# Patient Record
Sex: Female | Born: 1940
Health system: Southern US, Community
[De-identification: ages and names within clinical notes are randomized; demographics above are authoritative.]

## PROBLEM LIST (undated history)

## (undated) DIAGNOSIS — K5190 Ulcerative colitis, unspecified, without complications: Secondary | ICD-10-CM

## (undated) DIAGNOSIS — M719 Bursopathy, unspecified: Secondary | ICD-10-CM

## (undated) DIAGNOSIS — K219 Gastro-esophageal reflux disease without esophagitis: Secondary | ICD-10-CM

## (undated) DIAGNOSIS — I471 Supraventricular tachycardia, unspecified: Secondary | ICD-10-CM

## (undated) DIAGNOSIS — R0782 Intercostal pain: Secondary | ICD-10-CM

## (undated) DIAGNOSIS — I2510 Atherosclerotic heart disease of native coronary artery without angina pectoris: Secondary | ICD-10-CM

## (undated) DIAGNOSIS — R498 Other voice and resonance disorders: Secondary | ICD-10-CM

## (undated) DIAGNOSIS — M1990 Unspecified osteoarthritis, unspecified site: Secondary | ICD-10-CM

## (undated) DIAGNOSIS — G20A1 Parkinson's disease without dyskinesia, without mention of fluctuations: Secondary | ICD-10-CM

## (undated) DIAGNOSIS — I10 Essential (primary) hypertension: Secondary | ICD-10-CM

## (undated) DIAGNOSIS — G20 Parkinson's disease: Secondary | ICD-10-CM

## (undated) DIAGNOSIS — E785 Hyperlipidemia, unspecified: Secondary | ICD-10-CM

## (undated) HISTORY — PX: HEMORROIDECTOMY: SUR656

## (undated) HISTORY — PX: CHOLECYSTECTOMY: SHX55

## (undated) HISTORY — PX: DEEP BRAIN STIMULATOR PLACEMENT: SHX608

## (undated) HISTORY — PX: ABDOMINAL HYSTERECTOMY: SHX81

---

## 2005-07-19 ENCOUNTER — Ambulatory Visit: Payer: Self-pay | Admitting: Internal Medicine

## 2005-09-11 ENCOUNTER — Ambulatory Visit: Payer: Self-pay | Admitting: Internal Medicine

## 2005-10-05 ENCOUNTER — Ambulatory Visit: Payer: Self-pay | Admitting: Pain Medicine

## 2005-10-11 ENCOUNTER — Ambulatory Visit: Payer: Self-pay | Admitting: Pain Medicine

## 2005-11-28 ENCOUNTER — Ambulatory Visit: Payer: Self-pay | Admitting: Pain Medicine

## 2005-12-06 ENCOUNTER — Ambulatory Visit: Payer: Self-pay | Admitting: Pain Medicine

## 2006-02-01 ENCOUNTER — Ambulatory Visit: Payer: Self-pay | Admitting: Pain Medicine

## 2006-02-19 ENCOUNTER — Ambulatory Visit: Payer: Self-pay | Admitting: Pain Medicine

## 2006-03-20 ENCOUNTER — Ambulatory Visit: Payer: Self-pay | Admitting: Pain Medicine

## 2006-03-28 ENCOUNTER — Ambulatory Visit: Payer: Self-pay | Admitting: Pain Medicine

## 2006-05-01 ENCOUNTER — Ambulatory Visit: Payer: Self-pay | Admitting: Pain Medicine

## 2006-07-31 ENCOUNTER — Ambulatory Visit: Payer: Self-pay | Admitting: Pain Medicine

## 2006-08-06 ENCOUNTER — Ambulatory Visit: Payer: Self-pay | Admitting: Pain Medicine

## 2006-09-18 ENCOUNTER — Ambulatory Visit: Payer: Self-pay | Admitting: Pain Medicine

## 2006-09-19 ENCOUNTER — Ambulatory Visit: Payer: Self-pay | Admitting: Pain Medicine

## 2006-09-26 ENCOUNTER — Ambulatory Visit: Payer: Self-pay | Admitting: Unknown Physician Specialty

## 2006-09-26 DIAGNOSIS — K5790 Diverticulosis of intestine, part unspecified, without perforation or abscess without bleeding: Secondary | ICD-10-CM | POA: Insufficient documentation

## 2006-10-01 ENCOUNTER — Ambulatory Visit: Payer: Self-pay | Admitting: Pain Medicine

## 2006-10-04 ENCOUNTER — Ambulatory Visit: Payer: Self-pay | Admitting: Pain Medicine

## 2006-11-23 ENCOUNTER — Ambulatory Visit: Payer: Self-pay | Admitting: Pain Medicine

## 2006-12-03 ENCOUNTER — Ambulatory Visit: Payer: Self-pay | Admitting: Pain Medicine

## 2006-12-12 ENCOUNTER — Ambulatory Visit: Payer: Self-pay | Admitting: Pain Medicine

## 2006-12-26 ENCOUNTER — Ambulatory Visit: Payer: Self-pay | Admitting: Pain Medicine

## 2007-01-02 ENCOUNTER — Ambulatory Visit: Payer: Self-pay | Admitting: Pain Medicine

## 2007-01-02 ENCOUNTER — Ambulatory Visit: Payer: Self-pay | Admitting: Internal Medicine

## 2007-01-22 ENCOUNTER — Ambulatory Visit: Payer: Self-pay | Admitting: Pain Medicine

## 2007-01-30 ENCOUNTER — Ambulatory Visit: Payer: Self-pay | Admitting: Pain Medicine

## 2007-02-11 ENCOUNTER — Ambulatory Visit: Payer: Self-pay | Admitting: Unknown Physician Specialty

## 2007-02-13 ENCOUNTER — Ambulatory Visit: Payer: Self-pay | Admitting: Unknown Physician Specialty

## 2007-03-18 ENCOUNTER — Ambulatory Visit: Payer: Self-pay | Admitting: Pain Medicine

## 2007-04-09 ENCOUNTER — Ambulatory Visit: Payer: Self-pay | Admitting: Pain Medicine

## 2007-05-14 ENCOUNTER — Ambulatory Visit: Payer: Self-pay | Admitting: Pain Medicine

## 2007-05-20 ENCOUNTER — Ambulatory Visit: Payer: Self-pay | Admitting: Pain Medicine

## 2007-06-24 ENCOUNTER — Ambulatory Visit: Payer: Self-pay | Admitting: Pain Medicine

## 2007-07-29 ENCOUNTER — Ambulatory Visit: Payer: Self-pay | Admitting: Pain Medicine

## 2007-08-13 ENCOUNTER — Ambulatory Visit: Payer: Self-pay | Admitting: Pain Medicine

## 2007-08-19 ENCOUNTER — Ambulatory Visit: Payer: Self-pay | Admitting: Pain Medicine

## 2007-09-17 ENCOUNTER — Ambulatory Visit: Payer: Self-pay | Admitting: Pain Medicine

## 2007-10-29 ENCOUNTER — Ambulatory Visit: Payer: Self-pay | Admitting: Specialist

## 2007-11-11 ENCOUNTER — Ambulatory Visit: Payer: Self-pay | Admitting: Pain Medicine

## 2007-12-17 ENCOUNTER — Ambulatory Visit: Payer: Self-pay | Admitting: Pain Medicine

## 2007-12-25 ENCOUNTER — Ambulatory Visit: Payer: Self-pay | Admitting: Pain Medicine

## 2008-01-21 ENCOUNTER — Ambulatory Visit: Payer: Self-pay | Admitting: Pain Medicine

## 2008-02-20 ENCOUNTER — Ambulatory Visit: Payer: Self-pay | Admitting: Pain Medicine

## 2008-02-26 ENCOUNTER — Ambulatory Visit: Payer: Self-pay | Admitting: Pain Medicine

## 2008-03-19 ENCOUNTER — Ambulatory Visit: Payer: Self-pay | Admitting: Pain Medicine

## 2008-04-16 ENCOUNTER — Ambulatory Visit: Payer: Self-pay | Admitting: Pain Medicine

## 2008-05-07 ENCOUNTER — Ambulatory Visit: Payer: Self-pay | Admitting: Unknown Physician Specialty

## 2008-05-25 ENCOUNTER — Ambulatory Visit: Payer: Self-pay | Admitting: Pain Medicine

## 2008-06-22 ENCOUNTER — Ambulatory Visit: Payer: Self-pay | Admitting: Pain Medicine

## 2008-07-15 ENCOUNTER — Ambulatory Visit: Payer: Self-pay | Admitting: Pain Medicine

## 2008-08-18 ENCOUNTER — Ambulatory Visit: Payer: Self-pay | Admitting: Pain Medicine

## 2008-08-26 ENCOUNTER — Ambulatory Visit: Payer: Self-pay | Admitting: Pain Medicine

## 2008-10-13 ENCOUNTER — Ambulatory Visit: Payer: Self-pay | Admitting: Pain Medicine

## 2008-11-09 ENCOUNTER — Ambulatory Visit: Payer: Self-pay | Admitting: Pain Medicine

## 2008-12-10 ENCOUNTER — Ambulatory Visit: Payer: Self-pay | Admitting: Pain Medicine

## 2008-12-16 ENCOUNTER — Ambulatory Visit: Payer: Self-pay | Admitting: Pain Medicine

## 2009-02-17 ENCOUNTER — Ambulatory Visit: Payer: Self-pay | Admitting: Pain Medicine

## 2009-03-16 ENCOUNTER — Ambulatory Visit: Payer: Self-pay | Admitting: Pain Medicine

## 2009-04-15 ENCOUNTER — Ambulatory Visit: Payer: Self-pay | Admitting: Pain Medicine

## 2009-05-13 ENCOUNTER — Ambulatory Visit: Payer: Self-pay | Admitting: Pain Medicine

## 2009-06-07 ENCOUNTER — Ambulatory Visit: Payer: Self-pay | Admitting: Internal Medicine

## 2009-06-10 ENCOUNTER — Ambulatory Visit: Payer: Self-pay | Admitting: Pain Medicine

## 2009-07-15 ENCOUNTER — Ambulatory Visit: Payer: Self-pay | Admitting: Pain Medicine

## 2009-08-12 ENCOUNTER — Ambulatory Visit: Payer: Self-pay | Admitting: Pain Medicine

## 2009-09-09 ENCOUNTER — Ambulatory Visit: Payer: Self-pay | Admitting: Pain Medicine

## 2009-09-15 ENCOUNTER — Ambulatory Visit: Payer: Self-pay | Admitting: Ophthalmology

## 2009-09-21 ENCOUNTER — Ambulatory Visit: Payer: Self-pay | Admitting: Ophthalmology

## 2009-10-03 ENCOUNTER — Emergency Department: Payer: Self-pay | Admitting: Emergency Medicine

## 2009-10-09 ENCOUNTER — Emergency Department: Payer: Self-pay | Admitting: Internal Medicine

## 2009-10-12 ENCOUNTER — Ambulatory Visit: Payer: Self-pay | Admitting: Pain Medicine

## 2009-11-11 ENCOUNTER — Ambulatory Visit: Payer: Self-pay | Admitting: Pain Medicine

## 2009-12-09 ENCOUNTER — Ambulatory Visit: Payer: Self-pay | Admitting: Pain Medicine

## 2010-02-01 ENCOUNTER — Ambulatory Visit: Payer: Self-pay | Admitting: Pain Medicine

## 2010-03-01 ENCOUNTER — Ambulatory Visit: Payer: Self-pay | Admitting: Pain Medicine

## 2010-07-18 ENCOUNTER — Ambulatory Visit: Payer: Self-pay | Admitting: Pain Medicine

## 2010-07-25 ENCOUNTER — Ambulatory Visit: Payer: Self-pay | Admitting: Pain Medicine

## 2010-08-15 ENCOUNTER — Ambulatory Visit: Payer: Self-pay | Admitting: Pain Medicine

## 2010-08-24 ENCOUNTER — Ambulatory Visit: Payer: Self-pay | Admitting: Pain Medicine

## 2010-09-13 ENCOUNTER — Ambulatory Visit: Payer: Self-pay | Admitting: Pain Medicine

## 2010-09-19 ENCOUNTER — Ambulatory Visit: Payer: Self-pay | Admitting: Pain Medicine

## 2010-10-13 ENCOUNTER — Ambulatory Visit: Payer: Self-pay | Admitting: Pain Medicine

## 2010-10-23 ENCOUNTER — Observation Stay: Payer: Self-pay | Admitting: Internal Medicine

## 2010-10-23 DIAGNOSIS — R072 Precordial pain: Secondary | ICD-10-CM

## 2010-11-02 ENCOUNTER — Other Ambulatory Visit: Payer: Self-pay | Admitting: Dermatology

## 2011-01-23 ENCOUNTER — Ambulatory Visit: Payer: Self-pay | Admitting: Pain Medicine

## 2011-02-20 ENCOUNTER — Ambulatory Visit: Payer: Self-pay | Admitting: Pain Medicine

## 2011-04-05 ENCOUNTER — Ambulatory Visit: Payer: Self-pay | Admitting: Pain Medicine

## 2011-05-24 ENCOUNTER — Ambulatory Visit: Payer: Self-pay | Admitting: Internal Medicine

## 2012-05-23 ENCOUNTER — Ambulatory Visit: Payer: Self-pay | Admitting: Specialist

## 2012-06-05 ENCOUNTER — Ambulatory Visit: Payer: Self-pay | Admitting: Specialist

## 2012-06-05 LAB — CBC WITH DIFFERENTIAL/PLATELET
Basophil %: 1 %
Eosinophil #: 0 10*3/uL (ref 0.0–0.7)
Eosinophil %: 0.4 %
HGB: 12 g/dL (ref 12.0–16.0)
Lymphocyte #: 1 10*3/uL (ref 1.0–3.6)
Lymphocyte %: 12.9 %
MCHC: 32.8 g/dL (ref 32.0–36.0)
Monocyte #: 0.4 x10 3/mm (ref 0.2–0.9)
Neutrophil %: 80.3 %

## 2012-06-05 LAB — BASIC METABOLIC PANEL
Anion Gap: 5 — ABNORMAL LOW (ref 7–16)
Calcium, Total: 8.8 mg/dL (ref 8.5–10.1)
Co2: 25 mmol/L (ref 21–32)
EGFR (African American): >60
EGFR (Non-African Amer.): >60
Osmolality: 283 (ref 275–301)
Potassium: 4.4 mmol/L (ref 3.5–5.1)
Sodium: 139 mmol/L (ref 136–145)

## 2012-06-13 ENCOUNTER — Ambulatory Visit: Payer: Self-pay | Admitting: Specialist

## 2012-08-07 ENCOUNTER — Ambulatory Visit: Payer: Self-pay | Admitting: Hematology and Oncology

## 2012-08-07 LAB — CBC CANCER CENTER
Basophil #: 0.1 x10 3/mm (ref 0.0–0.1)
HCT: 34.1 % — ABNORMAL LOW (ref 35.0–47.0)
Lymphocyte %: 19 %
MCHC: 33.1 g/dL (ref 32.0–36.0)
Monocyte #: 0.3 x10 3/mm (ref 0.2–0.9)
Monocyte %: 5.9 %
Neutrophil #: 4.3 x10 3/mm (ref 1.4–6.5)
Neutrophil %: 73.1 %
RDW: 14.3 % (ref 11.5–14.5)

## 2012-08-07 LAB — IRON AND TIBC: Iron: 28 ug/dL — ABNORMAL LOW (ref 50–170)

## 2012-08-07 LAB — FERRITIN: Ferritin (ARMC): 9 ng/mL (ref 8–388)

## 2012-08-13 ENCOUNTER — Ambulatory Visit: Payer: Self-pay | Admitting: Hematology and Oncology

## 2012-09-13 ENCOUNTER — Ambulatory Visit: Payer: Self-pay | Admitting: Hematology and Oncology

## 2012-09-19 LAB — CBC CANCER CENTER
Basophil %: 0.8 %
Eosinophil %: 1.4 %
HCT: 39.7 % (ref 35.0–47.0)
Lymphocyte #: 1.2 x10 3/mm (ref 1.0–3.6)
MCH: 29.8 pg (ref 26.0–34.0)
MCHC: 33.1 g/dL (ref 32.0–36.0)
MCV: 90 fL (ref 80–100)
Monocyte #: 0.5 x10 3/mm (ref 0.2–0.9)
Monocyte %: 7.2 %
Neutrophil %: 71.6 %
Platelet: 200 x10 3/mm (ref 150–440)
RBC: 4.4 10*6/uL (ref 3.80–5.20)
RDW: 17.1 % — ABNORMAL HIGH (ref 11.5–14.5)

## 2012-09-19 LAB — IRON AND TIBC
Iron Bind.Cap.(Total): 307 ug/dL (ref 250–450)
Iron Saturation: 36 %
Unbound Iron-Bind.Cap.: 195 ug/dL

## 2012-10-14 ENCOUNTER — Ambulatory Visit: Payer: Self-pay | Admitting: Hematology and Oncology

## 2012-12-10 DIAGNOSIS — R498 Other voice and resonance disorders: Secondary | ICD-10-CM | POA: Insufficient documentation

## 2012-12-10 DIAGNOSIS — K117 Disturbances of salivary secretion: Secondary | ICD-10-CM | POA: Insufficient documentation

## 2013-01-23 ENCOUNTER — Ambulatory Visit: Payer: Self-pay | Admitting: Hematology and Oncology

## 2013-01-23 LAB — CBC CANCER CENTER
Basophil #: 0.1 x10 3/mm (ref 0.0–0.1)
HCT: 42.3 % (ref 35.0–47.0)
HGB: 13.8 g/dL (ref 12.0–16.0)
Lymphocyte #: 1.1 x10 3/mm (ref 1.0–3.6)
Lymphocyte %: 17.1 %
MCV: 94 fL (ref 80–100)
Monocyte #: 0.3 x10 3/mm (ref 0.2–0.9)
Monocyte %: 4.7 %
Neutrophil %: 76.1 %
RBC: 4.5 10*6/uL (ref 3.80–5.20)
RDW: 14 % (ref 11.5–14.5)
WBC: 6.5 x10 3/mm (ref 3.6–11.0)

## 2013-01-23 LAB — IRON AND TIBC
Iron Bind.Cap.(Total): 314 ug/dL (ref 250–450)
Iron Saturation: 20 %
Iron: 63 ug/dL (ref 50–170)

## 2013-01-23 LAB — RETICULOCYTES
Absolute Retic Count: 0.0492 10*6/uL (ref 0.019–0.186)
Reticulocyte: 1.09 % (ref 0.4–3.1)

## 2013-02-13 ENCOUNTER — Ambulatory Visit: Payer: Self-pay | Admitting: Hematology and Oncology

## 2013-11-05 ENCOUNTER — Ambulatory Visit: Payer: Self-pay | Admitting: Internal Medicine

## 2013-12-01 DIAGNOSIS — I2510 Atherosclerotic heart disease of native coronary artery without angina pectoris: Secondary | ICD-10-CM | POA: Insufficient documentation

## 2013-12-13 ENCOUNTER — Observation Stay: Payer: Self-pay | Admitting: Internal Medicine

## 2013-12-13 LAB — CBC
HCT: 40.7 % (ref 35.0–47.0)
HGB: 13 g/dL (ref 12.0–16.0)
MCH: 30.1 pg (ref 26.0–34.0)
MCHC: 32 g/dL (ref 32.0–36.0)
MCV: 94 fL (ref 80–100)
Platelet: 177 10*3/uL (ref 150–440)
RBC: 4.34 10*6/uL (ref 3.80–5.20)
RDW: 14.2 % (ref 11.5–14.5)
WBC: 7.4 10*3/uL (ref 3.6–11.0)

## 2013-12-13 LAB — COMPREHENSIVE METABOLIC PANEL
AST: 14 U/L — AB (ref 15–37)
Albumin: 3.5 g/dL (ref 3.4–5.0)
Alkaline Phosphatase: 115 U/L
Anion Gap: 9 (ref 7–16)
BUN: 26 mg/dL — ABNORMAL HIGH (ref 7–18)
Bilirubin,Total: 0.2 mg/dL (ref 0.2–1.0)
CO2: 24 mmol/L (ref 21–32)
CREATININE: 1.03 mg/dL (ref 0.60–1.30)
Calcium, Total: 8.1 mg/dL — ABNORMAL LOW (ref 8.5–10.1)
Chloride: 111 mmol/L — ABNORMAL HIGH (ref 98–107)
EGFR (African American): >60
EGFR (Non-African Amer.): 56 — ABNORMAL LOW
GLUCOSE: 107 mg/dL — AB (ref 65–99)
Osmolality: 292 (ref 275–301)
Potassium: 3.8 mmol/L (ref 3.5–5.1)
SGPT (ALT): 10 U/L — ABNORMAL LOW
Sodium: 144 mmol/L (ref 136–145)
TOTAL PROTEIN: 7.2 g/dL (ref 6.4–8.2)

## 2013-12-13 LAB — LIPASE, BLOOD: Lipase: 122 U/L (ref 73–393)

## 2013-12-13 LAB — URINALYSIS, COMPLETE
Bilirubin,UR: NEGATIVE
Blood: NEGATIVE
GLUCOSE, UR: NEGATIVE mg/dL (ref 0–75)
Ketone: NEGATIVE
Leukocyte Esterase: NEGATIVE
NITRITE: NEGATIVE
PROTEIN: NEGATIVE
Ph: 7 (ref 4.5–8.0)
RBC,UR: <1 /HPF (ref 0–5)
Specific Gravity: 1.004 (ref 1.003–1.030)
Squamous Epithelial: 1
WBC UR: 1 /HPF (ref 0–5)

## 2013-12-13 LAB — PROTIME-INR
INR: 1
Prothrombin Time: 12.7 secs (ref 11.5–14.7)

## 2013-12-13 LAB — TROPONIN I: Troponin-I: <0.02 ng/mL

## 2013-12-13 LAB — APTT: ACTIVATED PTT: 32.7 s (ref 23.6–35.9)

## 2013-12-15 LAB — BASIC METABOLIC PANEL
ANION GAP: 8 (ref 7–16)
BUN: 25 mg/dL — ABNORMAL HIGH (ref 7–18)
CALCIUM: 8.2 mg/dL — AB (ref 8.5–10.1)
CREATININE: 1.09 mg/dL (ref 0.60–1.30)
Chloride: 108 mmol/L — ABNORMAL HIGH (ref 98–107)
Co2: 25 mmol/L (ref 21–32)
EGFR (African American): >60
EGFR (Non-African Amer.): 52 — ABNORMAL LOW
GLUCOSE: 115 mg/dL — AB (ref 65–99)
Osmolality: 287 (ref 275–301)
POTASSIUM: 3.8 mmol/L (ref 3.5–5.1)
Sodium: 141 mmol/L (ref 136–145)

## 2013-12-15 LAB — CBC WITH DIFFERENTIAL/PLATELET
Basophil #: 0.1 10*3/uL (ref 0.0–0.1)
Basophil %: 1.1 %
EOS ABS: 0.1 10*3/uL (ref 0.0–0.7)
Eosinophil %: 2.3 %
HCT: 39.8 % (ref 35.0–47.0)
HGB: 12.9 g/dL (ref 12.0–16.0)
LYMPHS ABS: 1.4 10*3/uL (ref 1.0–3.6)
LYMPHS PCT: 24.3 %
MCH: 30.2 pg (ref 26.0–34.0)
MCHC: 32.3 g/dL (ref 32.0–36.0)
MCV: 93 fL (ref 80–100)
Monocyte #: 0.4 x10 3/mm (ref 0.2–0.9)
Monocyte %: 7.4 %
NEUTROS ABS: 3.7 10*3/uL (ref 1.4–6.5)
Neutrophil %: 64.9 %
Platelet: 162 10*3/uL (ref 150–440)
RBC: 4.26 10*6/uL (ref 3.80–5.20)
RDW: 14 % (ref 11.5–14.5)
WBC: 5.8 10*3/uL (ref 3.6–11.0)

## 2013-12-23 DIAGNOSIS — E782 Mixed hyperlipidemia: Secondary | ICD-10-CM | POA: Insufficient documentation

## 2014-06-05 NOTE — Op Note (Signed)
PATIENT NAME:  Kelsey Owens, Kelsey Owens MR#:  [id number] DATE OF BIRTH:  [birth year]  DATE OF PROCEDURE:  06/13/2012  PREOPERATIVE DIAGNOSES:  1. Large tear of the right rotator cuff, supraspinatus.  2. Advanced arthritis, right acromioclavicular joint.  3. Severe impingement, right subacromial space.   POSTOPERATIVE DIAGNOSES:  1. Large tear of the right rotator cuff, supraspinatus.  2. Advanced arthritis, right acromioclavicular joint.  3. Severe impingement, right subacromial space.   OPERATIONS:  1. Arthroscopic right rotator cuff repair.  2. Arthroscopic right distal clavicle excision.  3. Arthroscopic right subacromial decompression.   SURGEON: Park Breed, MD  ANESTHESIA: General endotracheal plus interscalene block.   COMPLICATIONS: None.  ESTIMATED BLOOD LOSS: Minimal.   REPLACED: None.   IMPLANTS: One 6.5 mm ArthroCare Spartan anchor and multiple sutures.   OPERATIVE PROCEDURE: The patient was brought to the operating room, where after having an interscalene block instilled, her deep nerve stimulator was turned off. She underwent general endotracheal anesthesia and was placed in the left lateral decubitus position and padded appropriately on the beanbag. The right shoulder was prepped and draped in sterile fashion and placed in 10 pounds of traction. Arthroscopy was carried out through posterior, anterior and lateral portals throughout the procedure. The arthroscope was introduced into the joint, and the biceps tendon was intact, without any significant fraying. The labrum was intact. The glenohumeral joint did not have any significant degenerative change. There was a large tear of the rotator cuff starting at the biceps tendon anteriorly and extending posteriorly to the infraspinatus. This was retracted and scarred. The motorized resector was introduced into the joint and a generalized debridement carried out. The arthroscope was redirected in the subacromial space and the motorized  resector used to continue the bursectomy and removal of soft tissues. The rotator cuff tear pattern was identified. The supraspinatus was completely torn off and was split in a V fashion. The posterior cuff had good tissue. By introducing a grasper, we could see that the anterior cuff could be brought back posteriorly and a large flap could be brought out easily to the tuberosity. After finishing the exposure, the large bur was used to remove soft tissue and superficial bone from the tuberosity. Three Orthocord sutures were placed side-to-side to provide convergence repair of the medial aspect of the cuff. A 6.5 mm Spartan anchor was introduced in the tuberosity, and these 2 Orthocord sutures were passed through the lateral flap and the anterior and posterior portions of the cuff, and these were tied down snugly after the traction was reduced to 5 pounds. This repaired the tendon back to the bone well and eliminated any gap medially. The soft tissue on the undersurface of the acromion was removed with an ArthroCare wand, and a large bur was used to remove the prominence of the anterior acromion. It was also used to remove the distal clavicle for a distance of 8 to 10 mm. Once this was completed, the decompression was satisfactory, and the cuff was completely repaired. The instruments were removed, and stab wounds were closed with 3-0 nylon suture. Marcaine 0.5% with epinephrine and morphine were placed in the joint and the bursa. Dry sterile dressing was applied. TENS unit was not used due to her nerve stimulator. A sling was applied. The patient was awakened and taken to recovery in good condition   ____________________________ Park Breed, MD hem:OSi D: 06/13/2012 12:21:39 ET T: 06/13/2012 12:40:30 ET JOB#: [id number]  cc: Park Breed, MD, <Dictator> Kelsey Blacksher E Winslow Verrill  MD ELECTRONICALLY SIGNED 06/14/2012 12:25

## 2014-06-06 NOTE — Consult Note (Signed)
PATIENT NAME:  Kelsey Owens, DUNIGAN MR#:  [id number] DATE OF BIRTH:  12-Feb-1941  DATE OF CONSULTATION:  12/13/2013  FAMILY PHYSICIAN: Sharlet Salina C. Hall Busing, MD  CARDIOLOGIST: Corey Skains, MD  INDICATIONS: Left-sided chest pain with radiation to left arm.  HISTORY OF PRESENT ILLNESS: The patient is a 74 year old white female with a history of Parkinson's and has a brain stimulator. She had an abnormal stress test about 7 years ago. Scheduled to have a repeat functional study this but began to have left-sided chest pain radiating to her arm with left-sided , so patient was admitted for further evaluation. recently saw Dr. Nehemiah Massed in the office including nurse practitioner and he recommended further evaluation for recent symptoms. The patient    which was unremarkable. She does have Parkinson's disease, relatively well controlled with the brain stimulator. Her symptoms of chest pain and left arm discomfort has been going on now for several days and has gotten progressively worse with rest and with exertion, so she presented for further evaluation. EKG had positive findings.   PAST MEDICAL HISTORY:  1. Parkinson's. 2. History of abnormal stress test.  3.  Migraine headaches. 4.  Irritable bowel syndrome. 5.  Reflux. 6.  Neuropathy. 7. history of coronary artery disease.   PAST SURGICAL HISTORY: Tonsillectomy, rotator cuff surgery, cholecystectomy, hysterectomy, hemorrhoidectomy.   ALLERGIES: LEVAQUIN, CHLORPROMAZINE, PENICILLIN, CEFTIN.  MEDICATIONS: 1. Almotriptan 12.5 one half tablet as needed. 2. Sinemet 25/100 mg 1.5 tablet in the morning at 8:00, another at 11:00, followed by a third tablet at 2:00, the fourth tablet at 5:00 and the final dose at 8:00. She takes it 5 times a day. 3. Centrum Silver once a day.  4. Klonopin 0.5 mg at bedtime. 5.   Delzicol 400 mg 3 capsules twice a day. 6.  Gabapentin 300 mg 1 capsule twice a day.  7. Meloxicam 7.5 once a day.  8. Methocarbamol 500 mg 2  tablets 4 times a day. 9. Norco 5/325 one every 6 hours.  10. Omeprazole 20 mg a day.  11. Premarin 0.625 once a day. 12. Ropinirole 1 mg 3 times a day. 13. Selegiline 5 mg 1 capsule once a day.  14. Sertraline 25 mg orally 1 tablet once a day.  15. Topiramate 25 mg 3 times a day.   SOCIAL HISTORY: Married. Lives with her husband. Denies smoking or alcohol consumption. Retired Pharmacist, hospital.   FAMILY HISTORY: Myocardial infarction.   REVIEW OF SYSTEMS: Denies blackout spells or syncope. Denies nausea, vomiting. Denies fever, chills, sweats. No weight loss, no weight gain. Denies hemoptysis, hematemesis. Denies blood per rectum. She has had irritable bowel syndrome, Parkinson's disease with tremors, weakness, fatigue. Otherwise negative.   PHYSICAL EXAMINATION: VITAL SIGNS: Blood pressure was 130/60, pulse of 80, respiratory rate of 16, afebrile.   HEENT: Normocephalic, atraumatic. Pupils equal and reactive to light.  NECK: Supple.  LUNGS: Clear.  HEART: Regular rate and rhythm.  ABDOMEN: Benign.  EXTREMITIES: Within normal limits. NEUROLOGIC: She has slight tremors secondary to Parkinson's disease, mild rigidity.  SKIN: Normal.  LABORATORY DATA: Glucose 107, BUN 26, creatinine 1.03, sodium 144, potassium 3.8, chloride of 111, bicarbonate 24, calcium 8, lipase 152, total protein 7.3, albumin 2.5. LFTs otherwise negative. Troponin less than 0.02. White count 7.4, hemoglobin 13, hematocrit (Dictation Anomaly), platelet count 177,000. PT 12.7. UA: Trace bacteria, otherwise negative.   DIAGNOSTIC DATA: Chest x-ray otherwise negative. CT of the abdomen and pelvis: Slight diverticulosis, otherwise negative.   ASSESSMENT: Possible unstable angina, chest  pain, history of mild coronary artery disease, Parkinson's disease, irritable bowel syndrome, migraine headaches, reflux disease.   PLAN: 1. I agree with  ROMI . follow up EKG. Recommend further evaluation of cardiac status including study versus  cardiac catheterization.  with a history of abnormal functional study. At this point, she has had significant discomfort that brought her to the emergency room.  2. Continue aspirin therapy. Consider nitrates.  3. Parkinsons. Continue current medical regimen for Parkinson's therapy.  4. Echocardiogram may be helpful.  5. Recommend cardiac catheterization prior to discharge. 6. Continue migraine headache therapy. 7. Gastroesophageal reflux disease. Continue omeprazole therapy.  8. Deep vein thrombosis prophylaxis  We will try to Dr. Nehemiah Massed.    ____________________________ Loran Senters. Clayborn Bigness, MD ddc:TT D: 12/13/2013 14:04:00 ET T: 12/13/2013 18:54:01 ET JOB#: [id number]  cc: Andry Bogden D. Clayborn Bigness, MD, <Dictator> Yolonda Kida MD ELECTRONICALLY SIGNED 01/16/2014 11:06

## 2014-06-06 NOTE — Discharge Summary (Signed)
PATIENT NAME:  Kelsey Owens, Kelsey Owens MR#:  [id number] DATE OF BIRTH:  1940/05/04  DATE OF ADMISSION:  12/13/2013 DATE OF DISCHARGE:  12/15/2013  DISCHARGE DIAGNOSES: 1.  Chest pain secondary to coronary artery disease.  2.  Parkinson disease. 3.  Ulcerative colitis.   DISCHARGE MEDICATIONS:   1.  Carbidopa with levodopa 25/100, 1.5 tablets once a day in the morning and 1.5 tablets every 3 hours until 8 in the evening.  2.  Requip 1 mg p.o. t.i.d. 3.  Selegiline 5 mg daily.  4.  Premarin 0.625 mg daily.  5.  Zoloft 25 mg p.o. daily.  6.  Amitriptyline 12.5 mg half tablet once a day.  7.  Methocarbamol 500 mg 2 tablets p.o. 4 times daily.  8.  Omeprazole 20 mg p.o. daily.  9.  Meloxicam 7.5 mg p.o. daily.  10.  Norco 5/325 one tablet every 6 hours as needed for pain.  11.  Topamax 25 mg p.o. t.i.d.  12.  Neurontin 300 mg p.o. b.i.d.  13.  Clonazepam 0.5 mg p.o. daily.  14.  Centrum Silver 1 tablet daily.   DIET:  Regular.   CONSULTATIONS:  Cardiology consult with Dr. Clayborn Bigness.   PROCEDURES:  Cardiac catheterization:  The patient's cardiac catheterization revealed mid left anterior descending artery lesion with normal left ventricular function. The patient was advised to have a Myoview on Wednesday in the office for medical therapy.   HOSPITAL COURSE:  A 73 year old female patient with Parkinson disease and ulcerative colitis, who came in because of chest pain. The patient had chest pressure that was relieved with nitroglycerin. The patient's troponins were negative. She was admitted to medical service on telemetry. Our cardiologist, Dr. Clayborn Bigness, saw the patient. The patient was taken to cardiac catheterization on November 2, and cardiac catheterization revealed moderate coronary artery disease. The patient was advised to follow up with a Myoview on Wednesday in the office for medical therapy.   The patient is a 74 year old female patient. As I mentioned, she came because of chest pain. She  follows up with Dr. Hall Busing. She has Parkinson disease and history of brain stimulator placed. She was given aspirin, nitroglycerin, and beta blockers in the hospital. The patient had a cardiac catheterization. Dr. Clayborn Bigness said that the patient can be discharged, so we will discharge the patient. The patient will follow up with Dr. Clayborn Bigness in the office for medical management.   PHYSICAL EXAMINATION ON DAY OF DISCHARGE: CARDIOVASCULAR:  S1, S2 regular.  LUNGS:  Clear to auscultation. No wheeze noted.  ABDOMEN:  Soft, nontender, nondistended. Bowel sounds present.   L IMAGING:  She also had a CAT scan of the abdomen, which showed no acute abnormality.   TIME SPENT:  More than 35 minutes.    ____________________________ Epifanio Lesches, MD sk:nb D: 12/16/2013 13:46:15 ET T: 12/16/2013 22:58:47 ET JOB#: [id number]  cc: Epifanio Lesches, MD, <Dictator> Epifanio Lesches MD ELECTRONICALLY SIGNED 01/01/2014 8:31

## 2014-06-06 NOTE — Consult Note (Signed)
Chief Complaint:  Subjective/Chief Complaint Recurrent chest pain possible angina feels reasonably well now lying  in bed   VITAL SIGNS/ANCILLARY NOTES: **Vital Signs.:   01-Nov-15 11:43  Vital Signs Type Routine  Temperature Temperature (F) 97.9  Celsius 36.6  Pulse Pulse 56  Respirations Respirations 18  Systolic BP Systolic BP 354  Diastolic BP (mmHg) Diastolic BP (mmHg) 68  Mean BP 83  Pulse Ox % Pulse Ox % 98  Pulse Ox Activity Level  At rest  Oxygen Delivery Room Air/ 21 %  *Intake and Output.:   01-Nov-15 12:14  Grand Totals Intake:  240 Output:      Net:  240 24 Hr.:  350  Oral Intake      In:  240  Percentage of Meal Eaten  50   Brief Assessment:  GEN well developed, well nourished, no acute distress   Cardiac Regular   Respiratory normal resp effort  clear BS   Gastrointestinal Normal   Gastrointestinal details normal Soft  Nontender  Nondistended   EXTR negative cyanosis/clubbing, negative edema   Lab Results: LabObservation:  31-Oct-15 10:29   OBSERVATION Reason for Test  Hepatic:  31-Oct-15 02:39   Bilirubin, Total 0.2  Alkaline Phosphatase 115 (46-116 NOTE: New Reference Range 09/02/13)  SGPT (ALT)  10 (14-63 NOTE: New Reference Range 09/02/13)  SGOT (AST)  14  Total Protein, Serum 7.2  Albumin, Serum 3.5  Cardiology:  31-Oct-15 10:29   Echo Doppler REASON FOR EXAM:     COMMENTS:     PROCEDURE: [facility] - ECHO DOPPLER COMPLETE(TRANSTHOR)  - Dec 13 2013 10:29AM   RESULT: Echocardiogram Report  Patient Name:   Kelsey Owens Date of Exam: 12/13/2013 Medical Rec #:  [id number]             Custom1: Date of Birth:  Jun 27, 1940           Height:       64.0 in Patient Age:    74 years           Weight:       146.0 lb Patient Gender: F                  BSA:          1.71 m??  Indications: Chest Pain Sonographer:    Janalee Dane RCS Referring Phys: Azucena Freed, N  Summary:  1. Left ventricular ejection fraction, by visual estimation, is  65 to  70%.  2. Normal global left ventricular systolic function. 2D AND M-MODE MEASUREMENTS (normal ranges within parentheses): Left Ventricle:          Normal IVSd (2D):      0.88 cm (0.7-1.1) LVPWd (2D):     0.86 cm (0.7-1.1) Aorta/LA:                  Normal LVIDd (2D):     3.89 cm (3.4-5.7) Aortic Root (2D): 3.30 cm (2.4-3.7) LVIDs (2D):     2.39 cm           Left Atrium (2D): 3.80cm (1.9-4.0) LV FS (2D):     38.6 %   (>25%) LV EF (2D):     69.5 %   (>50%)                                   Right Ventricle:  RVd (2D):        6.22 cm LV DIASTOLIC FUNCTION: MV Peak E: 0.83 m/s E/e' Ratio: 10.40 MV Peak A: 0.97 m/s Decel Time: 257 msec E/A Ratio: 0.85 SPECTRAL DOPPLER ANALYSIS (where applicable): Mitral Valve: MV P1/2 Time: 74.53 msec MV Area, PHT: 2.95 cm?? Tricuspid Valve and PA/RV Systolic Pressure: TR Max Velocity: 2.73 m/s RA  Pressure: 10 mmHg RVSP/PASP: 39.9 mmHg  PHYSICIAN INTERPRETATION: Left Ventricle: The left ventricular internal cavity size was normal. LV  septal wall thickness was normal. LV posterior wall thickness was normal.   Global LV systolic function was normal. Left ventricular ejection  fraction, by visual estimation, is 65 to 70%. Right Ventricle: The right ventricular size is normal. Global RV systolic  function is normal. Left Atrium: The left atrium is normal in size. Right Atrium: The right atrium is normal in size. Pericardium: There is no evidence of pericardial effusion. Mitral Valve: The mitral valve is normal in structure. Trace mitral valve  regurgitation is seen. Tricuspid Valve: The tricuspid valve is normal. Trivial tricuspid  regurgitation is visualized. The tricuspid regurgitant velocity is 2.73  m/s, and with an assumed right atrial pressure of 10 mmHg, the estimated  right ventricular systolic pressure is normal at 39.9 mmHg. Aortic Valve: The aortic valve is normal. Pulmonic Valve: The pulmonic valve  is normal. No indication of pulmonic   valve regurgitation.  [place] MD Electronically signed by [place] Lujean Amel MD Signature Date/Time: 12/14/2013/3:29:58 PM  *** Final ***  IMPRESSION: .    Verified By: Yolonda Kida, M.D., MD  Routine Chem:  31-Oct-15 02:39   Glucose, Serum  107  BUN  26  Creatinine (comp) 1.03  Sodium, Serum 144  Potassium, Serum 3.8  Chloride, Serum  111  CO2, Serum 24  Calcium (Total), Serum  8.1  Anion Gap 9  Osmolality (calc) 292  eGFR (African American) >60  eGFR (Non-African American)  56 (eGFR values <1m/min/1.73 m2 may be an indication of chronic kidney disease (CKD). Calculated eGFR, using the MRDR Study equation, is useful in  patients with stable renal function. The eGFR calculation will not be reliable in acutely ill patients when serum creatinine is changing rapidly. It is not useful in patients on dialysis. The eGFR calculation may not be applicable to patients at the low and high extremes of body sizes, pregnant women, and vegetarians.)  Lipase 122 (Result(s) reported on 13 Dec 2013 at 03:34AM.)  02-Nov-15 04:07   BUN  25  Cardiac:  31-Oct-15 02:39   Troponin I < 0.02 (0.00-0.05 0.05 ng/mL or less: NEGATIVE  Repeat testing in 3-6 hrs  if clinically indicated. >0.05 ng/mL: POTENTIAL  MYOCARDIAL INJURY. Repeat  testing in 3-6 hrs if  clinically indicated. NOTE: An increase or decrease  of 30% or more on serial  testing suggests a  clinically important change)    07:19   Troponin I < 0.02 (0.00-0.05 0.05 ng/mL or less: NEGATIVE  Repeat testing in 3-6 hrs  if clinically indicated. >0.05 ng/mL: POTENTIAL  MYOCARDIAL INJURY. Repeat  testing in 3-6 hrs if  clinically indicated. NOTE: An increase or decrease  of 30% or more on serial  testing suggests a  clinically important change)    11:44   Troponin I < 0.02 (0.00-0.05 0.05 ng/mL or less: NEGATIVE  Repeat testing in 3-6 hrs  if clinically  indicated. >0.05 ng/mL: POTENTIAL  MYOCARDIAL INJURY. Repeat  testing in 3-6 hrs if  clinically indicated. NOTE: An increase  or decrease  of 30% or more on serial  testing suggests a  clinically important change)  Routine UA:  31-Oct-15 04:34   Color (UA) Straw  Clarity (UA) Clear  Glucose (UA) Negative  Bilirubin (UA) Negative  Ketones (UA) Negative  Specific Gravity (UA) 1.004  Blood (UA) Negative  pH (UA) 7.0  Protein (UA) Negative  Nitrite (UA) Negative  Leukocyte Esterase (UA) Negative (Result(s) reported on 13 Dec 2013 at 05:00AM.)  RBC (UA) <1 /HPF  WBC (UA) 1 /HPF  Bacteria (UA) TRACE  Epithelial Cells (UA) 1 /HPF (Result(s) reported on 13 Dec 2013 at 05:00AM.)  Routine Coag:  31-Oct-15 02:39   Prothrombin 12.7  INR 1.0 (INR reference interval applies to patients on anticoagulant therapy. A single INR therapeutic range for coumarins is not optimal for all indications; however, the suggested range for most indications is 2.0 - 3.0. Exceptions to the INR Reference Range may include: Prosthetic heart valves, acute myocardial infarction, prevention of myocardial infarction, and combinations of aspirin and anticoagulant. The need for a higher or lower target INR must be assessed individually. Reference: The Pharmacology and Management of the Vitamin K  antagonists: the seventh ACCP Conference on Antithrombotic and Thrombolytic Therapy. RCVKF.8403 Sept:126 (3suppl): [id number]. A HCT value >55% may artifactually increase the PT.  In one study,  the increase was an average of 25%. Reference:  "Effect on Routine and Special Coagulation Testing Values of Citrate Anticoagulant Adjustment in Patients with High HCT Values." American Journal of Clinical Pathology 2006;126:400-405.)  Activated PTT (APTT) 32.7 (A HCT value >55% may artifactually increase the APTT. In one study, the increase was an average of 19%. Reference: "Effect on Routine and Special Coagulation Testing  Values of Citrate Anticoagulant Adjustment in Patients with High HCT Values." American Journal of Clinical Pathology 2006;126:400-405.)  Routine Hem:  31-Oct-15 02:39   WBC (CBC) 7.4  RBC (CBC) 4.34  Hemoglobin (CBC) 13.0  Hematocrit (CBC) 40.7  Platelet Count (CBC) 177 (Result(s) reported on 13 Dec 2013 at 02:56AM.)  MCV 94  MCH 30.1  MCHC 32.0  RDW 14.2   Radiology Results: XRay:    31-Oct-15 02:56, Chest Portable Single View  Chest Portable Single View   REASON FOR EXAM:    Chest Pain  COMMENTS:       PROCEDURE: DXR - DXR PORTABLE CHEST SINGLE VIEW  - Dec 13 2013  2:56AM     CLINICAL DATA:  Chest pain    EXAM:  PORTABLE CHEST - 1 VIEW    COMPARISON:  10/22/2010    FINDINGS:  The heart size and mediastinal contours are within normal limits.  Both lungs are clear. The visualized skeletal structures are  unremarkable.     IMPRESSION:  No active disease.      Electronically Signed    By: Kerby Moors M.D.    On: 12/13/2013 02:59         Verified By: Angelita Ingles, M.D.,  Cardiology:    31-Oct-15 10:29, Echo Doppler  Echo Doppler   REASON FOR EXAM:      COMMENTS:       PROCEDURE: [facility] - ECHO DOPPLER COMPLETE(TRANSTHOR)  - Dec 13 2013 10:29AM     RESULT: Echocardiogram Report    Patient Name:   Kelsey Owens Date of Exam: 12/13/2013  Medical Rec #:  [id number]             Custom1:  Date of Birth:  1940-07-15  Height:       64.0 in  Patient Age:    10 years           Weight:       146.0 lb  Patient Gender: F                  BSA:          1.71 m??    Indications: Chest Pain  Sonographer:    Janalee Dane RCS  Referring Phys: Azucena Freed, N    Summary:   1. Left ventricular ejection fraction, by visual estimation, is 65 to   70%.   2. Normal global left ventricular systolic function.  2D AND M-MODE MEASUREMENTS (normal ranges within parentheses):  Left Ventricle:          Normal  IVSd (2D):      0.88 cm (0.7-1.1)  LVPWd (2D):      0.86 cm (0.7-1.1) Aorta/LA:                  Normal  LVIDd (2D):     3.89 cm (3.4-5.7) Aortic Root (2D): 3.30 cm (2.4-3.7)  LVIDs (2D):     2.39 cm           Left Atrium (2D): 3.80cm (1.9-4.0)  LV FS (2D):     38.6 %   (>25%)  LV EF (2D):     69.5 %   (>50%)                                    Right Ventricle:                                    RVd (2D):        5.62 cm  LV DIASTOLIC FUNCTION:  MV Peak E: 0.83 m/s E/e' Ratio: 10.40  MV Peak A: 0.97 m/s Decel Time: 257 msec  E/A Ratio: 0.85  SPECTRAL DOPPLER ANALYSIS (where applicable):  Mitral Valve:  MV P1/2 Time: 74.53 msec  MV Area, PHT: 2.95 cm??  Tricuspid Valve and PA/RV Systolic Pressure: TR Max Velocity: 2.73 m/s RA   Pressure: 10 mmHg RVSP/PASP: 39.9 mmHg    PHYSICIAN INTERPRETATION:  Left Ventricle: The left ventricular internal cavity size was normal. LV   septal wall thickness was normal. LV posterior wall thickness was normal.     Global LV systolic function was normal. Left ventricular ejection   fraction, by visual estimation, is 65 to 70%.  Right Ventricle: The right ventricular size is normal. Global RV systolic   function is normal.  Left Atrium: The left atrium is normal in size.  Right Atrium: The right atrium is normal in size.  Pericardium: There is no evidence of pericardial effusion.  Mitral Valve: The mitral valve is normal in structure. Trace mitral valve   regurgitation is seen.  Tricuspid Valve: The tricuspid valve is normal. Trivial tricuspid   regurgitation is visualized. The tricuspid regurgitant velocity is 2.73   m/s, and with an assumed right atrial pressure of 10 mmHg, the estimated   right ventricular systolic pressure is normal at 39.9 mmHg.  Aortic Valve: The aortic valve is normal.  Pulmonic Valve: The pulmonic valve is normal. No indication of pulmonic     valve regurgitation.    [place] MD  Electronically signed by  [place] MD  Signature Date/Time:  12/14/2013/3:29:58 PM    *** Final ***    IMPRESSION: .        Verified By: Yolonda Kida, M.D., MD  CT:    31-Oct-15 05:34, CT Abdomen and Pelvis With Contrast  CT Abdomen and Pelvis With Contrast   REASON FOR EXAM:    (1) llq pain; (2) llq pain  COMMENTS:       PROCEDURE: CT  - CT ABDOMEN / PELVIS  W  - Dec 13 2013  5:34AM     CLINICAL DATA:  Left lower quadrant pain. History of ulcerative  colitis.    EXAM:  CT ABDOMEN AND PELVIS WITH CONTRAST    TECHNIQUE:  Multidetector CT imaging of the abdomen and pelvis was performed  using the standard protocol following bolus administration of  intravenous contrast.  CONTRAST:  80 cc Isovue 300    COMPARISON:  CT scan dated 02/11/2007    FINDINGS:  Gallbladder has been removed. Liver, biliary tree, spleen, pancreas,  adrenal glands, and kidneys appear normal. There are scattered  diverticula in the colon. Uterus has been removed. Ovaries are  normal. Bladder is normal. Terminal ileum is normal. The appendix is  not visualized and has probably been removed. There are multiple  radiodense tablets in the cecum as well as in the descending and  sigmoid portions of the colon as well as 1 in the rectum.    No acute osseous abnormality. Moderately severe right hip arthritis.  Lumbar scoliosis.   IMPRESSION:  1. No acute abnormality.  2. Slight diverticulosis.  3. Multiple radiodense tablets in the colon consistent with non  absorbed pills.      Electronically Signed    By: Rozetta Nunnery M.D.    On: 12/13/2013 05:45         Verified By: Larey Seat, M.D.,   Assessment/Plan:  Assessment/Plan:  Assessment IMP  unstable angina  coronary disease  Parkinson's  hypertension  hyperlipidemia  migraine headache  GERD with reflux  neuropathy .   Plan PLAN  continue telemetry for now short-term anticoagulation  cardiac catheterization within 24 hours  continue Parkinson's therapy  nitrates p.r.n.  continue  reflux therapy  continue pain control with Norco  agree with clonazepam for mild anxiety  if catheterization is okay would recommend discharge with follow-up as an outpatient   Electronic Signatures: Yolonda Kida (MD)  (Signed [phone] 13:34)  Authored: Chief Complaint, VITAL SIGNS/ANCILLARY NOTES, Brief Assessment, Lab Results, Radiology Results, Assessment/Plan   Last Updated: 02-Nov-15 13:34 by Yolonda Kida (MD)

## 2014-06-06 NOTE — H&P (Signed)
PATIENT NAME:  Kelsey Owens, Kelsey Owens MR#:  [id number] DATE OF BIRTH:  1940-02-23  DATE OF ADMISSION:  12/13/2013  REFERRING DOCTOR:  Brunilda Payor A. Edd Fabian, MD  PRIMARY CARE DOCTOR: Leona Carry. Hall Busing, MD  PRIMARY CARDIOLOGIST: Corey Skains, MD  ADMITTING DOCTOR: Juluis Mire, MD   CHIEF COMPLAINT: Left-sided chest pain with radiation to left arm.   HISTORY OF PRESENT ILLNESS: The patient is a 74 year old pleasant Caucasian female with a past medical history significant for Parkinson disease, status post deep brain stimulation, history of abnormal stress test about 7 years ago, migraine headaches, irritable bowel syndrome, gastroesophageal reflux disease and neuropathy presents to the Emergency Room with the complaints of left-sided chest pain with radiation to the left arm, intermittent symptoms, ongoing for about 12 hours' duration. The patient took about 5 tablets of sublingual nitroglycerin and aspirin at home, following which her pain slightly decreased, but did not become pain free. The patient also has some mild shortness of breath and some nausea, but no vomiting with the chest pain. No dizziness. No palpitations. No loss of consciousness. No fever. No cough. No vomiting. No diarrhea. No dysuria. No dysuria. No hematuria. She does admit that she has some exertional dyspnea, ongoing for the past few days, and she had an abnormal stress test, that is a nuclear stress test, about 7 years ago, which showed indication for 25% blockage, for which she has seen her primary doctor and also cardiologist, and she was scheduled to undergo a stress test this coming Wednesday with Dr. Nehemiah Massed. In the Emergency Room, the patient was evaluated by the ED physician and received IV morphine, following which her chest pain eased, and she is comfortably resting at this time. The patient was also noted to have just mildly left lower abdominal tenderness by the ED physician, for which she has undergone a CT of the abdomen and  pelvis, which did not show any acute pathology except for scattered colon diverticula. The patient denies any abdominal pain.   Further work-up in the Emergency Room revealed blood tests which were normal, troponin first set negative, and an EKG with sinus rhythm and artifact secondary to deep brain stimulator, but normal sinus rhythm with ventricular rate of 82 beats per minute and nonspecific ST-T changes. The hospitalist service was consulted by the ED physician for further evaluation and management. The patient is comfortably resting in the bed at this time and denies any acute symptoms at this time.    PAST MEDICAL HISTORY:  1.  Parkinson syndrome, status post deep vein stimulation.  2.  History of abnormal cardiac stress test about 7 years ago.  3.  Migraine headaches.  4.  Irritable bowel syndrome.  5.  Gastroesophageal reflux disease.  6.  Neuropathy.   PAST SURGICAL HISTORY:  1.  Tonsillectomy.  2.  Rotator cuff repair.  3.  Cholecystectomy.  4.  Hysterectomy.  5.  Hemorrhoidectomy.   ALLERGIES:  1.  LEVAQUIN.  2.  CHLORPROMAZINE.  3.  PENICILLIN.  4.  CEFTIN.   HOME MEDICATIONS:  Almotriptan 12.5 mg tablet, 0.5 tablet orally as needed.  1.  Carbidopa/levodopa 25 mg/100 mg table,t 1.5 tablets orally 1 in the morning at 0800, 1 tablet at 1100 hours, 1 tablet at 1400 hours, 1 tablet at 1700 hours and 1 tablet at 2000 hours.  2.  Centrum multivitamin once a day.  3.  Clonazepam 0.5 mg tablet, 1 tablet orally once at bedtime.  4.  Delzicol 400 mg delayed-release capsules,  3 capsules orally 2 times a day.  5.  Gabapentin 300 mg capsule, 1 capsule 2 times a day.  6.  Meloxicam 7.5 mg oral tablet, 1 tablet a day as needed.  7.  Methocarbamol 500 mg oral, 2 tablets orally 4 times a day as needed.  8.  Narco 325/5 mg tablet, orally 1 tablet every 6 hours as needed for pain.  9.  Omeprazole 20 mg tablet, 1 tablet orally once a day.  10.  Premarin 0.625 mg, 1 tablet orally once a  day.  11.  Ropinirole 1 mg tablet, 1 tablet orally 3 times a day.  12.  Selegiline 5 mg oral capsule, 1 capsule orally once a day in the morning.  13.  Sertraline 25 mg tablet orally, 1 tablet orally once a day.  14.  Topiramate 25 mg tablet orally, 1 tablet orally 3 times a day.   SOCIAL HISTORY: She is married and lives with her husband. Denies any history of smoking, alcohol or drug usage. She does take an occasional glass of wine once probably once a month.   FAMILY HISTORY: Significant for father died of a myocardial infarction. Mother also died of myocardial infarction.  REVIEW OF SYSTEMS:  CONSTITUTIONAL: Negative for fever, chills, fatigue, general weakness, weight gain or weight loss.  EYES: Negative for blurred vision or double vision. No pain. No redness. No inflammation.  EARS, NOSE, AND THROAT: Negative for tinnitus, ear pain, hearing loss, epistaxis, nasal discharge, difficulty swallowing.  RESPIRATORY: Negative for cough, wheezing, hemoptysis, dyspnea, painful respirations.  CARDIOVASCULAR: As mentioned in the history of present illness, left-sided chest pain, which is pressure type with radiation to the left arm, intermittent, ongoing for the past 12 hours, associated with mild shortness of breath and nausea. No palpitations. No dizziness. No loss of consciousness. She does have some exertional dyspnea, ongoing for the past few days, for which she has seen her cardiologist, her primary doctor, and she was scheduled for the stress test next week.  GASTROINTESTINAL: Negative for nausea, vomiting, diarrhea, abdominal pain, hematemesis, melena, or rectal bleeding. She does have a history of IBS, for which she takes medications.  GENITOURINARY: Negative for dysuria, hematuria, frequency, or urgency.  ENDOCRINE: Negative for polyuria, polydipsia. No heat or cold intolerance.  HEMATOLOGIC AND LYMPHATIC: Negative for anemia or easy bruising or bleeding.  MUSCULOSKELETAL: Chronic back pain  and arthritic pain, under control with p.r.n. medications. Denies any symptoms at this time.  NEUROLOGICAL: Negative for focal weakness, numbness. No CVA, TIA, or seizure disorder. She does have a history of migraine headaches and neuropathy, for which she takes medications and is under control.  PSYCHIATRIC: Negative for anxiety. She does have insomnia, for which she takes clonazepam as needed.   PHYSICAL EXAMINATION:  VITAL SIGNS: Temperature 97.8 degrees, pulse rate 80 per minute, respirations 20, blood pressure 123/58, oxygen saturation 98% on room air.   GENERAL: Well-nourished well-developed, pleasant and cooperate, alert, in no acute distress, comfortably lying in the bed.  HEAD: Atraumatic, normocephalic.  EYES: Pupils equal and reactive to light and accommodation. No conjunctival pallor. No scleral icterus. Extraocular movements intact.  NOSE: No nasal lesions. No drainage.  EARS: No drainage. No external lesions.  ORAL CAVITY: No mucosal lesions. No exudates. No masses.  NECK: Supple. No JVD. No thyromegaly. No carotid bruit. Range of motion of neck movements normal.  RESPIRATORY: Good respiratory effort. Not using accessory muscles of respiration. Clear to auscultation with bilateral vesicular breath sounds present.  CARDIOVASCULAR: S1,  S2 regular. No murmurs, gallops, or clicks appreciated. Pulses are equal at carotid, femoral and pedal pulses. No peripheral edema.  GASTROINTESTINAL: Abdomen is soft and nontender. Bowel sounds present and equal in all 4 quadrants. No hepatosplenomegaly.  GENITOURINARY: Deferred.  MUSCULOSKELETAL: Range of motion normal and adequate in all areas. Strength and tone equal bilaterally in upper and lower extremities.  SKIN: Inspection within normal limits.  LYMPHATIC: No cervical lymphadenopathy.  VASCULAR: Good dorsalis pedis and posterior tibial pulses.  NEUROLOGICAL: Alert, awake, and oriented x 3. Cranial nerves II through XII grossly intact. DTRs 2+  symmetrical in the bilateral upper and lower extremities. Motor strength is 5/5 in both upper and lower extremities.  PSYCHIATRIC: Judgment and insight adequate. Alert and oriented x 3. Memory and mood within normal limits.   LABORATORY DATA: Serum glucose 107, BUN 26, creatinine 1.03, sodium 144, potassium 3.8, chloride 111, bicarbonate 24, total calcium 8.1, lipase 122, total protein 7.2, albumin 3.5, total bilirubin 0.2, alkaline phosphatase 115, AST 14, ALT 10. Troponin less than 0.02. WBC is 7.4, hemoglobin 13.0, hematocrit 40.7, platelet count 177,000, MCV 94. Prothrombin 12.7, INR 1.0.   URINALYSIS: Trace bacteria, leukocyte esterase negative, nitrite negative.   IMAGING STUDIES:  CHEST X-RAY: No active disease, heart size and mediastinal corridors are within normal limits. Both lungs are clear.   CT Of THE ABDOMEN AND THE PELVIS: No acute abnormality. Slight diverticulosis.  Multiple radiodense tablets in the colon, consistent with non absorbed pills.    EKG: Poor baseline, secondary due to artifact, secondary due to deep brain stimulator; otherwise, normal sinus rhythm with ventricular rate of 82 beats per minute. Nonspecific ST-T changes.   ASSESSMENT AND PLAN: The patient is a 74 year old Caucasian female with a past medical history significant for Parkinson disease, status post deep brain stimulator, history of abnormal nuclear stress test about 7 years ago, migraine headaches, irritable bowel syndrome, gastroesophageal reflux disease, neuropathy, who presents to the Emergency Room with the complaints of left-sided chest pain with radiation to the left arm associated with mild shortness of breath and nausea of 12 hours' duration. The patient took 5 nitroglycerin and aspirin at home with partial relief of the pain, and in the ER got IV morphine, following which her chest pain resolved.  1.  Left-sided chest pain with radiation to the left arm, concerning for acute coronary event.   Troponin, first set negative. EKG: No acute ST-T changes. Rule out acute coronary syndrome. Plan: Admit to telemetry. Cycle cardiac enzymes. Aspirin, nitroglycerin, beta blocker, echocardiogram, and cardiology consultation.   2.  Parkinson disease, status post deep brain stimulator, stable on home medications. Continue home medications.  3.  History of irritable bowel syndrome, stable. Continue home medications.  4.  History of migraine headaches, stable on home medications. Continue same.  5.  History of gastroesophageal reflux disease, stable on PPI. Continue same.  6.  Deep vein thrombosis prophylaxis with subcutaneous Lovenox.  7.  Gastrointestinal prophylaxis with proton pump inhibitors.    CODE STATUS: Full Code.   TIME SPENT: 55 minutes    ____________________________ Juluis Mire, MD enr:MT D: 12/13/2013 07:27:48 ET T: 12/13/2013 08:04:43 ET JOB#: [id number]  cc: Juluis Mire, MD, <Dictator> Leona Carry. Hall Busing, MD Juluis Mire MD ELECTRONICALLY SIGNED 01/09/2014 19:44

## 2014-06-26 ENCOUNTER — Emergency Department: Payer: Medicare PPO

## 2014-06-26 ENCOUNTER — Encounter: Payer: Self-pay | Admitting: Emergency Medicine

## 2014-06-26 ENCOUNTER — Emergency Department
Admission: EM | Admit: 2014-06-26 | Discharge: 2014-06-26 | Disposition: A | Discharge status: Home / Self Care | Payer: Medicare PPO | Attending: Emergency Medicine | Admitting: Emergency Medicine

## 2014-06-26 DIAGNOSIS — W19XXXA Unspecified fall, initial encounter: Secondary | ICD-10-CM

## 2014-06-26 DIAGNOSIS — Y9389 Activity, other specified: Secondary | ICD-10-CM | POA: Diagnosis not present

## 2014-06-26 DIAGNOSIS — W01198A Fall on same level from slipping, tripping and stumbling with subsequent striking against other object, initial encounter: Secondary | ICD-10-CM | POA: Insufficient documentation

## 2014-06-26 DIAGNOSIS — G20 Parkinson's disease: Secondary | ICD-10-CM | POA: Diagnosis not present

## 2014-06-26 DIAGNOSIS — T148 Other injury of unspecified body region: Secondary | ICD-10-CM | POA: Diagnosis not present

## 2014-06-26 DIAGNOSIS — Y9289 Other specified places as the place of occurrence of the external cause: Secondary | ICD-10-CM | POA: Diagnosis not present

## 2014-06-26 DIAGNOSIS — Y998 Other external cause status: Secondary | ICD-10-CM | POA: Insufficient documentation

## 2014-06-26 DIAGNOSIS — S0101XA Laceration without foreign body of scalp, initial encounter: Secondary | ICD-10-CM | POA: Diagnosis not present

## 2014-06-26 DIAGNOSIS — S0990XA Unspecified injury of head, initial encounter: Secondary | ICD-10-CM | POA: Diagnosis present

## 2014-06-26 DIAGNOSIS — T148XXA Other injury of unspecified body region, initial encounter: Secondary | ICD-10-CM

## 2014-06-26 DIAGNOSIS — S39012A Strain of muscle, fascia and tendon of lower back, initial encounter: Secondary | ICD-10-CM | POA: Insufficient documentation

## 2014-06-26 HISTORY — DX: Gastro-esophageal reflux disease without esophagitis: K21.9

## 2014-06-26 HISTORY — DX: Parkinson's disease: G20

## 2014-06-26 HISTORY — DX: Parkinson's disease without dyskinesia, without mention of fluctuations: G20.A1

## 2014-06-26 HISTORY — DX: Unspecified osteoarthritis, unspecified site: M19.90

## 2014-06-26 MED ORDER — ACETAMINOPHEN 325 MG PO TABS
ORAL_TABLET | ORAL | Status: AC
  Administered 2014-06-26: 325 mg via ORAL
  Filled 2014-06-26: qty 2

## 2014-06-26 MED ORDER — LIDOCAINE-EPINEPHRINE 2 %-1:100000 IJ SOLN
INTRAMUSCULAR | Status: AC
  Filled 2014-06-26: qty 1.7

## 2014-06-26 MED ORDER — ACETAMINOPHEN 325 MG PO TABS
650.0000 mg | ORAL_TABLET | Freq: Once | ORAL | Status: AC
  Administered 2014-06-26: 325 mg via ORAL

## 2014-06-26 MED ORDER — LIDOCAINE-EPINEPHRINE-TETRACAINE (LET) SOLUTION
3.0000 mL | Freq: Once | NASAL | Status: AC
  Administered 2014-06-26: 3 mL via TOPICAL

## 2014-06-26 MED ORDER — LIDOCAINE-EPINEPHRINE 2 %-1:100000 IJ SOLN: 5.0000 mg | Freq: Once | INTRAMUSCULAR | Status: DC

## 2014-06-26 MED ORDER — LIDOCAINE-EPINEPHRINE (PF) 1 %-1:200000 IJ SOLN
INTRAMUSCULAR | Status: AC
  Filled 2014-06-26: qty 30

## 2014-06-26 MED ORDER — LIDOCAINE-EPINEPHRINE-TETRACAINE (LET) SOLUTION
NASAL | Status: AC
  Administered 2014-06-26: 3 mL via TOPICAL
  Filled 2014-06-26: qty 3

## 2014-06-26 MED ORDER — HYDROCODONE-ACETAMINOPHEN 5-325 MG PO TABS: 1.0000 | ORAL_TABLET | Freq: Four times a day (QID) | ORAL | Status: DC | PRN

## 2014-06-26 NOTE — ED Notes (Signed)
Discussed patient status with Dr. Corky Downs. Per MD, patient appropriate to be evaluated in pod D

## 2014-06-26 NOTE — ED Provider Notes (Signed)
CSN: [id number]     Arrival date & time 06/26/14  1449 History   First MD Initiated Contact with Patient 06/26/14 1649     Chief Complaint  Patient presents with  . Head Injury     (Consider location/radiation/quality/duration/timing/severity/associated sxs/prior Treatment) HPI patient fell and struck the back of her head today she has a history of Parkinson's disease lost her balance and fell backwards hitting a wooden shelf landing firmly on her buttocks is complaining of pain to her low back into the back of her scalp denies any loss of consciousness any blurred vision any nausea disorientation and according to family is otherwise acting appropriately rates her pain as about a 6 out of 10 nothing making anything better particularly or worse and no other associated signs or symptoms currently  Past Medical History  Diagnosis Date  . Parkinson disease   . Arthritis   . GERD (gastroesophageal reflux disease)    Past Surgical History  Procedure Laterality Date  . Abdominal hysterectomy    . Cholecystectomy    . Hemorroidectomy    . Deep brain stimulator placement     No family history on file. History  Substance Use Topics  . Smoking status: Never Smoker   . Smokeless tobacco: Not on file  . Alcohol Use: Yes     Comment: occasional   OB History    No data available     Review of Systems  Constitutional: No fever/chills Eyes: No visual changes. ENT: No sore throat. Cardiovascular: Denies chest pain. Respiratory: Denies shortness of breath. Gastrointestinal: No abdominal pain.  No nausea, no vomiting.  No diarrhea.  No constipation. Genitourinary: Negative for dysuria. Musculoskeletal: Negative for back pain. Skin: Negative for rash. Neurological: Negative for headaches, focal weakness or numbness. Negative 6 systems as best review the patient's upper noted in the history of present illness  Allergies  Thorazine and Ceftin  Home Medications   Prior to Admission  medications   Medication Sig Start Date End Date Taking? Authorizing Provider  HYDROcodone-acetaminophen (NORCO) 5-325 MG per tablet Take 1 tablet by mouth every 6 (six) hours as needed for moderate pain or severe pain. 06/26/14   Vineta Carone William C Kaysha Parsell, PA-C   BP 119/80 mmHg  Pulse 92  Temp(Src) 97.6 F (36.4 C) (Oral)  Resp 15  Ht 5' 4"  (1.626 m)  Wt 147 lb (66.679 kg)  BMI 25.22 kg/m2  SpO2 98% Physical Exam Physical exam revealed Caucasian female appearing stated age well-developed well-nourished and in no acute distress vitals were within normal limits Head ears eyes nose neck and throat exam was grossly unremarkable except for laceration of the scalp approximately 4 cm in length Cardiovascular showed regular rate and rhythm no murmurs or gallops  pulmonary lungs process patient bilaterally Musculoskeletal pain with palpation across her lumbosacral spine bruising to her left thumb but moving all extremities without difficulty Neuro exam is nonfocal she does have a tremor likely due to her underlying Parkinson condition cranial nerves II through XII are grossly intact no appreciable gross deficits Skin appears otherwise free of rash or disease Psychologically patient is very pleasant appropriate GCS of 15 ED Course  Procedures laceration repair patient had approximately 4 cm laceration to the occipital area of her scalp area was irrigated cleaned prepped with Betadine let gel was applied local lidocaine with epinephrine approximately 2 cc injected into the wound area was prepped in sterile fashion and closed using 5 surgical staples with good wound closure good hemostasis patient  tolerated the procedure very well Labs Review Labs Reviewed - No data to display  Imaging Review Dg Lumbar Spine 2-3 Views  06/26/2014   CLINICAL DATA:  Acute onset of lower back pain. Status post traumatic injury. Initial encounter.  EXAM: LUMBAR SPINE - 2-3 VIEW  COMPARISON:  CT of the abdomen and pelvis  performed 12/13/2013  FINDINGS: There is no evidence of fracture or subluxation. Vertebral bodies demonstrate normal height and alignment. Mild right-sided degenerative change is noted along the lower lumbar spine, with lateral osteophytes seen. Mild disc space narrowing is noted along the lumbar spine at multiple levels.  The visualized bowel gas pattern is unremarkable in appearance; air and stool are noted within the colon. The sacroiliac joints are within normal limits. Clips are noted within the right upper quadrant, reflecting prior cholecystectomy.  IMPRESSION: 1. No evidence of fracture or subluxation along the lumbar spine. 2. Stable mild degenerative change along the lumbar spine.   Electronically Signed   By: Garald Balding M.D.   On: 06/26/2014 17:27   Ct Head Wo Contrast  06/26/2014   CLINICAL DATA:  Fall. Laceration to back of head. Possible loss of consciousness. Deep brain stimulator.  EXAM: CT HEAD WITHOUT CONTRAST  TECHNIQUE: Contiguous axial images were obtained from the base of the skull through the vertex without intravenous contrast.  COMPARISON:  CT head without contrast 10/03/2009.  FINDINGS: Deep brain stimulators inter via a right frontal approach and terminate at the level the substantia nigra bilaterally.  No acute cortical infarct, hemorrhage, or mass lesion is present. Mild periventricular white matter hypoattenuation is similar to the prior exam.  Small burr holes are evident for the stimulator wires. The calvarium is otherwise intact. The paranasal sinuses and mastoid air cells are clear. Mild soft tissue swelling is present in the left paramedian occipital scalp without an underlying fracture.  IMPRESSION: 1. Soft tissue swelling in the left paramedian scalp without an underlying fracture. 2. No acute intracranial abnormality. 3. Stable mild atrophy and white matter disease. 4. Interval placement of bilateral deep brain stimulator.   Electronically Signed   By: San Morelle M.D.   On: 06/26/2014 15:33     EKG Interpretation None      MDM   Final diagnoses:  Laceration of scalp, initial encounter  Contusion  Lumbosacral strain, initial encounter  Fall, initial encounter        Eloise Mula Verdene Rio, PA-C 06/26/14 1805  Lisa Roca, MD 06/26/14 [phone]

## 2014-06-26 NOTE — ED Notes (Signed)
Patient fell today due to her parkinson's. Small laceration to back of head. Unsure if she lost consciousness or not. States that she is not on blood thinners.

## 2014-06-26 NOTE — ED Notes (Signed)
Pt. States she has parkinson's disease.  Pt. States she fell back and hit the center of back of head.  Pt. States before she fell she was very shaky.  Pt. States having multiple falls but not with the shaking.  Pt. Has 3 cm horizontal laceration to back of head.  Bleeding controlled at this time.

## 2014-07-03 ENCOUNTER — Emergency Department
Admission: EM | Admit: 2014-07-03 | Discharge: 2014-07-03 | Disposition: A | Discharge status: Home / Self Care | Payer: Medicare PPO | Attending: Emergency Medicine | Admitting: Emergency Medicine

## 2014-07-03 ENCOUNTER — Encounter: Payer: Self-pay | Admitting: Emergency Medicine

## 2014-07-03 DIAGNOSIS — Z4802 Encounter for removal of sutures: Secondary | ICD-10-CM | POA: Insufficient documentation

## 2014-07-03 NOTE — ED Provider Notes (Signed)
[facility] Emergency Department Provider Note  ____________________________________________  Time seen: 1135  I have reviewed the triage vital signs and the nursing notes.   HISTORY  Chief Complaint Suture / Staple Removal    HPI Kelsey Owens is a 74 y.o. female comes in today with on request to have staples removed. She was seen last Friday for laceration this was stapled. She states she has not had any problems with this. There is 0 out of 10 pain.   Past Medical History  Diagnosis Date  . Parkinson disease   . Arthritis   . GERD (gastroesophageal reflux disease)     There are no active problems to display for this patient.   Past Surgical History  Procedure Laterality Date  . Abdominal hysterectomy    . Cholecystectomy    . Hemorroidectomy    . Deep brain stimulator placement      Current Outpatient Rx  Name  Route  Sig  Dispense  Refill  . HYDROcodone-acetaminophen (NORCO) 5-325 MG per tablet   Oral   Take 1 tablet by mouth every 6 (six) hours as needed for moderate pain or severe pain.   12 tablet   0     Allergies Thorazine and Ceftin  History reviewed. No pertinent family history.  Social History History  Substance Use Topics  . Smoking status: Never Smoker   . Smokeless tobacco: Not on file  . Alcohol Use: Yes     Comment: occasional    Review of Systems Constitutional: No fever/chills Cardiovascular: Denies chest pain. Respiratory: Denies shortness of breath. Gastrointestinal: No abdominal pain.  No nausea, no vomiting.. Genitourinary: Negative for dysuria. Musculoskeletal: Negative for back pain. Skin: Negative for rash. Neurological: Negative for headaches 10-point ROS otherwise negative.  ____________________________________________   PHYSICAL EXAM:  VITAL SIGNS: ED Triage Vitals  Enc Vitals Group     BP 07/03/14 1131 118/70 mmHg     Pulse --      Resp 07/03/14 1131 18     Temp 07/03/14 1131  98 F (36.7 C)     Temp Source 07/03/14 1131 Oral     SpO2 07/03/14 1131 98 %     Weight 07/03/14 1131 136 lb (61.689 kg)     Height 07/03/14 1131 5' 4"  (1.626 m)     Head Cir --      Peak Flow --      Pain Score 07/03/14 1132 0     Pain Loc --      Pain Edu? --      Excl. in [place]? --     Constitutional: Alert and oriented. Well appearing and in no acute distress. Neck: No stridor.   Respiratory: Normal respiratory effort. Musculoskeletal: No lower extremity tenderness nor edema.  No joint effusions. Neurologic:  Normal speech and language. No gross focal neurologic deficits are appreciated. Speech is normal. No gait instability. Skin:  Skin is warm, dry and intact. No rash noted. Psychiatric: Mood and affect are normal. Speech and behavior are normal.  ____________________________________________   LABS (all labs ordered are listed, but only abnormal results are displayed)  Labs Reviewed - No data to display ____________________________________________   PROCEDURES  Procedure(s) performed: Staples removed by the nurse.  Critical Care performed: No  ____________________________________________   INITIAL IMPRESSION / ASSESSMENT AND PLAN / ED COURSE  Pertinent labs & imaging results that were available during my care of the patient were reviewed by me and considered in my medical decision  making (see chart for details).  Patient tolerated procedure well and there is no signs of infection she was told to return if any urgent concerns. ____________________________________________   FINAL CLINICAL IMPRESSION(S) / ED DIAGNOSES  Final diagnoses:  Encounter for staple removal      Johnn Hai, PA-C 07/03/14 1141  Lisa Roca, MD 07/03/14 [phone]

## 2014-07-03 NOTE — ED Notes (Signed)
Here for suture removal  Post fall last week

## 2015-01-21 ENCOUNTER — Encounter: Payer: Self-pay | Admitting: Emergency Medicine

## 2015-01-21 ENCOUNTER — Emergency Department
Admission: EM | Admit: 2015-01-21 | Discharge: 2015-01-21 | Disposition: A | Discharge status: Home / Self Care | Payer: Medicare PPO | Attending: Emergency Medicine | Admitting: Emergency Medicine

## 2015-01-21 ENCOUNTER — Emergency Department: Payer: Medicare PPO

## 2015-01-21 DIAGNOSIS — Y9289 Other specified places as the place of occurrence of the external cause: Secondary | ICD-10-CM | POA: Insufficient documentation

## 2015-01-21 DIAGNOSIS — S0083XA Contusion of other part of head, initial encounter: Secondary | ICD-10-CM | POA: Diagnosis not present

## 2015-01-21 DIAGNOSIS — T148XXA Other injury of unspecified body region, initial encounter: Secondary | ICD-10-CM

## 2015-01-21 DIAGNOSIS — Y998 Other external cause status: Secondary | ICD-10-CM | POA: Diagnosis not present

## 2015-01-21 DIAGNOSIS — S0990XA Unspecified injury of head, initial encounter: Secondary | ICD-10-CM

## 2015-01-21 DIAGNOSIS — W01198A Fall on same level from slipping, tripping and stumbling with subsequent striking against other object, initial encounter: Secondary | ICD-10-CM | POA: Diagnosis not present

## 2015-01-21 DIAGNOSIS — Y93E1 Activity, personal bathing and showering: Secondary | ICD-10-CM | POA: Insufficient documentation

## 2015-01-21 LAB — CBC
HEMATOCRIT: 39.5 % (ref 35.0–47.0)
HEMOGLOBIN: 13.2 g/dL (ref 12.0–16.0)
MCH: 30.6 pg (ref 26.0–34.0)
MCHC: 33.3 g/dL (ref 32.0–36.0)
MCV: 91.8 fL (ref 80.0–100.0)
Platelets: 172 10*3/uL (ref 150–440)
RBC: 4.3 MIL/uL (ref 3.80–5.20)
RDW: 13.7 % (ref 11.5–14.5)
WBC: 7.3 10*3/uL (ref 3.6–11.0)

## 2015-01-21 LAB — COMPREHENSIVE METABOLIC PANEL
ALBUMIN: 3.7 g/dL (ref 3.5–5.0)
ALK PHOS: 97 U/L (ref 38–126)
ALT: 5 U/L — ABNORMAL LOW (ref 14–54)
AST: 15 U/L (ref 15–41)
Anion gap: 4 — ABNORMAL LOW (ref 5–15)
BILIRUBIN TOTAL: 0.3 mg/dL (ref 0.3–1.2)
BUN: 20 mg/dL (ref 6–20)
CALCIUM: 8.5 mg/dL — AB (ref 8.9–10.3)
CO2: 24 mmol/L (ref 22–32)
CREATININE: 1.01 mg/dL — AB (ref 0.44–1.00)
Chloride: 109 mmol/L (ref 101–111)
GFR calc Af Amer: >60 mL/min (ref >60)
GFR calc non Af Amer: 53 mL/min — ABNORMAL LOW (ref >60)
GLUCOSE: 117 mg/dL — AB (ref 65–99)
Potassium: 3.4 mmol/L — ABNORMAL LOW (ref 3.5–5.1)
Sodium: 137 mmol/L (ref 135–145)
Total Protein: 6.9 g/dL (ref 6.5–8.1)

## 2015-01-21 LAB — TROPONIN I: Troponin I: <0.03 ng/mL (ref <0.031)

## 2015-01-21 MED ORDER — ONDANSETRON HCL 4 MG/2ML IJ SOLN
4.0000 mg | Freq: Once | INTRAMUSCULAR | Status: AC
  Administered 2015-01-21: 4 mg via INTRAVENOUS
  Filled 2015-01-21: qty 2

## 2015-01-21 MED ORDER — ACETAMINOPHEN 500 MG PO TABS
ORAL_TABLET | ORAL | Status: AC
  Filled 2015-01-21: qty 2

## 2015-01-21 MED ORDER — ONDANSETRON 4 MG PO TBDP: 4.0000 mg | ORAL_TABLET | Freq: Three times a day (TID) | ORAL | Status: DC | PRN

## 2015-01-21 MED ORDER — ACETAMINOPHEN 500 MG PO TABS: 1000.0000 mg | ORAL_TABLET | Freq: Once | ORAL | Status: DC

## 2015-01-21 MED ORDER — TRAMADOL HCL 50 MG PO TABS
ORAL_TABLET | ORAL | Status: AC
  Administered 2015-01-21: 50 mg via ORAL
  Filled 2015-01-21: qty 1

## 2015-01-21 MED ORDER — TRAMADOL HCL 50 MG PO TABS
50.0000 mg | ORAL_TABLET | Freq: Once | ORAL | Status: AC
  Administered 2015-01-21: 50 mg via ORAL

## 2015-01-21 MED ORDER — TRAMADOL HCL 50 MG PO TABS: 50.0000 mg | ORAL_TABLET | Freq: Four times a day (QID) | ORAL | Status: AC | PRN

## 2015-01-21 NOTE — ED Notes (Signed)
Pt states she fell this am, and hit her head on the tub, is not sure if she blacked out or just lost her footing, states she has parkinsons. Denies blood thinners. Pt has large hematoma and bruising to right temple, has been nauseated this afternoon, denies vomiting, pupils unequal and reactive.

## 2015-01-21 NOTE — ED Notes (Signed)
MD at bedside. 

## 2015-01-21 NOTE — ED Provider Notes (Signed)
[facility] Emergency Department Provider Note  Time seen: 6:24 PM  I have reviewed the triage vital signs and the nursing notes.   HISTORY  Chief Complaint Fall and Head Injury    HPI Kelsey Owens is a 74 y.o. female with a past medical history of Parkinson's, arthritis, gastric reflux who presents the emergency department after a fall. According to the patient she was in the shower this a.m. when she fell striking the right side of her head on the tub. Denies LOC. States she has Parkinson's and is often off-balance, thinks she lost her footing and slipped but she is not sure. States since the fall she has been feeling nauseated at times, so she came to the emergency department for evaluation. Denies use of blood thinners or aspirin. Denies any focal weakness or numbness. Does state mild headache.     Past Medical History  Diagnosis Date  . Parkinson disease ([place])   . Arthritis   . GERD (gastroesophageal reflux disease)     There are no active problems to display for this patient.   Past Surgical History  Procedure Laterality Date  . Abdominal hysterectomy    . Cholecystectomy    . Hemorroidectomy    . Deep brain stimulator placement      Current Outpatient Rx  Name  Route  Sig  Dispense  Refill  . HYDROcodone-acetaminophen (NORCO) 5-325 MG per tablet   Oral   Take 1 tablet by mouth every 6 (six) hours as needed for moderate pain or severe pain.   12 tablet   0     Allergies Thorazine and Ceftin  No family history on file.  Social History Social History  Substance Use Topics  . Smoking status: Never Smoker   . Smokeless tobacco: None  . Alcohol Use: Yes     Comment: occasional    Review of Systems Constitutional: Negative for fever. Cardiovascular: Negative for chest pain. Respiratory: Negative for shortness of breath. Gastrointestinal: Negative for abdominal pain Musculoskeletal: Negative for back pain. Neurological:  Mild headache, denies focal weakness or numbness. 10-point ROS otherwise negative.  ____________________________________________   PHYSICAL EXAM:  VITAL SIGNS: ED Triage Vitals  Enc Vitals Group     BP 01/21/15 1814 151/74 mmHg     Pulse Rate 01/21/15 1814 84     Resp 01/21/15 1814 21     Temp 01/21/15 1814 97.9 F (36.6 C)     Temp Source 01/21/15 1814 Oral     SpO2 01/21/15 1814 99 %     Weight 01/21/15 1803 142 lb (64.411 kg)     Height 01/21/15 1803 5' 4"  (1.626 m)     Head Cir --      Peak Flow --      Pain Score 01/21/15 1803 8     Pain Loc --      Pain Edu? --      Excl. in [place]? --     Constitutional: Alert and oriented. Well appearing and in no distress. Eyes: Normal exam, 2 mm pupils, reactive bilaterally however decreased vision in left eye which is chronic. ENT   Head: Significant sized hematoma to right forehead. No laceration or bleeding.   Mouth/Throat: Mucous membranes are moist. Cardiovascular: Normal rate, regular rhythm. No murmur Respiratory: Normal respiratory effort without tachypnea nor retractions. Breath sounds are clear  Gastrointestinal: Soft and nontender. No distention.   Musculoskeletal: Nontender with normal range of motion in all extremities Neurologic:  Normal speech  and language. No gross focal neurologic deficits are appreciated. Speech is normal. Grip strengths are equal. 5/5 motor in all extremities. Skin:  Skin is warm, dry and intact. Moderate hematoma to right forehead. Psychiatric: Mood and affect are normal. Speech and behavior are normal.   ____________________________________________    EKG  EKG reviewed and interpreted by myself shows normal sinus rhythm at 84 bpm, narrow QRS, normal axis, normal intervals, no ST changes. Normal EKG.  ____________________________________________    RADIOLOGY  CT head shows no acute intracranial abnormality  ____________________________________________    INITIAL IMPRESSION /  ASSESSMENT AND PLAN / ED COURSE  Pertinent labs & imaging results that were available during my care of the patient were reviewed by me and considered in my medical decision making (see chart for details).  Patient presents the emergency department after a fall, likely mechanical. We will check labs, EKG, CT head. Patient denies blood thinner use but does have a moderate sized hematoma to the right forehead. We will dose Zofran once the IV is established and monitor closely in the emergency department.  Labs within normal limits, CT head shows no acute intracranial abnormality. We'll discharge patient home with Tylenol as needed. Patient is agreeable to plan. ____________________________________________   FINAL CLINICAL IMPRESSION(S) / ED DIAGNOSES  Fall Closed head injury Hematoma   Harvest Dark, MD 01/21/15 [phone]

## 2015-01-21 NOTE — ED Notes (Signed)
Patient transported to CT 

## 2015-01-21 NOTE — Discharge Instructions (Signed)
Head Injury, Adult You have a head injury. Headaches and throwing up (vomiting) are common after a head injury. It should be easy to wake up from sleeping. Sometimes you must stay in the hospital. Most problems happen within the first 24 hours. Side effects may occur up to 7-10 days after the injury.  WHAT ARE THE TYPES OF HEAD INJURIES? Head injuries can be as minor as a bump. Some head injuries can be more severe. More severe head injuries include:  A jarring injury to the brain (concussion).  A bruise of the brain (contusion). This mean there is bleeding in the brain that can cause swelling.  A cracked skull (skull fracture).  Bleeding in the brain that collects, clots, and forms a bump (hematoma). WHEN SHOULD I GET HELP RIGHT AWAY?   You are confused or sleepy.  You cannot be woken up.  You feel sick to your stomach (nauseous) or keep throwing up (vomiting).  Your dizziness or unsteadiness is getting worse.  You have very bad, lasting headaches that are not helped by medicine. Take medicines only as told by your doctor.  You cannot use your arms or legs like normal.  You cannot walk.  You notice changes in the black spots in the center of the colored part of your eye (pupil).  You have clear or bloody fluid coming from your nose or ears.  You have trouble seeing. During the next 24 hours after the injury, you must stay with someone who can watch you. This person should get help right away (call 911 in the [place] if you start to shake and are not able to control it (have seizures), you pass out, or you are unable to wake up. HOW CAN I PREVENT A HEAD INJURY IN THE FUTURE?  Wear seat belts.  Wear a helmet while bike riding and playing sports like football.  Stay away from dangerous activities around the house. WHEN CAN I RETURN TO NORMAL ACTIVITIES AND ATHLETICS? See your doctor before doing these activities. You should not do normal activities or play contact sports until 1  week after the following symptoms have stopped:  Headache that does not go away.  Dizziness.  Poor attention.  Confusion.  Memory problems.  Sickness to your stomach or throwing up.  Tiredness.  Fussiness.  Bothered by bright lights or loud noises.  Anxiousness or depression.  Restless sleep. MAKE SURE YOU:   Understand these instructions.  Will watch your condition.  Will get help right away if you are not doing well or get worse.   This information is not intended to replace advice given to you by your health care provider. Make sure you discuss any questions you have with your health care provider.   Document Released: 01/13/2008 Document Revised: 02/20/2014 Document Reviewed: 10/07/2012 Elsevier Interactive Patient Education [employer].

## 2015-05-24 ENCOUNTER — Ambulatory Visit
Admission: RE | Admit: 2015-05-24 | Discharge: 2015-05-24 | Disposition: A | Discharge status: Home / Self Care | Payer: Medicare Other | Source: Ambulatory Visit | Attending: Internal Medicine | Admitting: Internal Medicine

## 2015-05-24 ENCOUNTER — Other Ambulatory Visit: Payer: Self-pay | Admitting: Internal Medicine

## 2015-05-24 DIAGNOSIS — R0789 Other chest pain: Secondary | ICD-10-CM

## 2015-05-24 DIAGNOSIS — R918 Other nonspecific abnormal finding of lung field: Secondary | ICD-10-CM | POA: Diagnosis not present

## 2015-05-24 DIAGNOSIS — R079 Chest pain, unspecified: Secondary | ICD-10-CM | POA: Insufficient documentation

## 2015-06-14 DIAGNOSIS — M7070 Other bursitis of hip, unspecified hip: Secondary | ICD-10-CM | POA: Insufficient documentation

## 2015-06-14 DIAGNOSIS — M7540 Impingement syndrome of unspecified shoulder: Secondary | ICD-10-CM | POA: Insufficient documentation

## 2015-10-10 ENCOUNTER — Emergency Department
Admission: EM | Admit: 2015-10-10 | Discharge: 2015-10-10 | Disposition: A | Discharge status: Home / Self Care | Payer: Medicare Other | Attending: Emergency Medicine | Admitting: Emergency Medicine

## 2015-10-10 ENCOUNTER — Emergency Department: Payer: Medicare Other

## 2015-10-10 DIAGNOSIS — W1800XA Striking against unspecified object with subsequent fall, initial encounter: Secondary | ICD-10-CM | POA: Insufficient documentation

## 2015-10-10 DIAGNOSIS — G20 Parkinson's disease: Secondary | ICD-10-CM | POA: Diagnosis not present

## 2015-10-10 DIAGNOSIS — R0781 Pleurodynia: Secondary | ICD-10-CM | POA: Diagnosis not present

## 2015-10-10 DIAGNOSIS — Y939 Activity, unspecified: Secondary | ICD-10-CM | POA: Insufficient documentation

## 2015-10-10 DIAGNOSIS — Y92002 Bathroom of unspecified non-institutional (private) residence single-family (private) house as the place of occurrence of the external cause: Secondary | ICD-10-CM | POA: Insufficient documentation

## 2015-10-10 DIAGNOSIS — Z791 Long term (current) use of non-steroidal anti-inflammatories (NSAID): Secondary | ICD-10-CM | POA: Insufficient documentation

## 2015-10-10 DIAGNOSIS — W19XXXA Unspecified fall, initial encounter: Secondary | ICD-10-CM

## 2015-10-10 DIAGNOSIS — M542 Cervicalgia: Secondary | ICD-10-CM | POA: Diagnosis not present

## 2015-10-10 DIAGNOSIS — Y999 Unspecified external cause status: Secondary | ICD-10-CM | POA: Diagnosis not present

## 2015-10-10 DIAGNOSIS — R51 Headache: Secondary | ICD-10-CM | POA: Diagnosis present

## 2015-10-10 LAB — CBC
HEMATOCRIT: 39.4 % (ref 35.0–47.0)
HEMOGLOBIN: 13.6 g/dL (ref 12.0–16.0)
MCH: 30.9 pg (ref 26.0–34.0)
MCHC: 34.6 g/dL (ref 32.0–36.0)
MCV: 89.4 fL (ref 80.0–100.0)
Platelets: 157 10*3/uL (ref 150–440)
RBC: 4.41 MIL/uL (ref 3.80–5.20)
RDW: 13.9 % (ref 11.5–14.5)
WBC: 6.7 10*3/uL (ref 3.6–11.0)

## 2015-10-10 LAB — BASIC METABOLIC PANEL
ANION GAP: 8 (ref 5–15)
BUN: 34 mg/dL — AB (ref 6–20)
CO2: 21 mmol/L — AB (ref 22–32)
Calcium: 9 mg/dL (ref 8.9–10.3)
Chloride: 109 mmol/L (ref 101–111)
Creatinine, Ser: 1.16 mg/dL — ABNORMAL HIGH (ref 0.44–1.00)
GFR calc Af Amer: 52 mL/min — ABNORMAL LOW (ref >60)
GFR calc non Af Amer: 45 mL/min — ABNORMAL LOW (ref >60)
GLUCOSE: 125 mg/dL — AB (ref 65–99)
POTASSIUM: 3.9 mmol/L (ref 3.5–5.1)
Sodium: 138 mmol/L (ref 135–145)

## 2015-10-10 LAB — HEPATIC FUNCTION PANEL
AST: 20 U/L (ref 15–41)
Albumin: 4.2 g/dL (ref 3.5–5.0)
Alkaline Phosphatase: 102 U/L (ref 38–126)
Bilirubin, Direct: 0.1 mg/dL (ref 0.1–0.5)
Indirect Bilirubin: 0.4 mg/dL (ref 0.3–0.9)
TOTAL PROTEIN: 7.1 g/dL (ref 6.5–8.1)
Total Bilirubin: 0.5 mg/dL (ref 0.3–1.2)

## 2015-10-10 LAB — DIFFERENTIAL
Basophils Absolute: 0.1 10*3/uL (ref 0–0.1)
Basophils Relative: 1 %
Eosinophils Absolute: 0.2 10*3/uL (ref 0–0.7)
Eosinophils Relative: 3 %
LYMPHS PCT: 12 %
Lymphs Abs: 0.8 10*3/uL — ABNORMAL LOW (ref 1.0–3.6)
MONO ABS: 0.4 10*3/uL (ref 0.2–0.9)
MONOS PCT: 6 %
NEUTROS ABS: 5.2 10*3/uL (ref 1.4–6.5)
Neutrophils Relative %: 78 %

## 2015-10-10 LAB — GLUCOSE, CAPILLARY: GLUCOSE-CAPILLARY: 120 mg/dL — AB (ref 65–99)

## 2015-10-10 LAB — TROPONIN I

## 2015-10-10 MED ORDER — MORPHINE SULFATE (PF) 4 MG/ML IV SOLN
4.0000 mg | Freq: Once | INTRAVENOUS | Status: AC
  Administered 2015-10-10: 4 mg via INTRAVENOUS
  Filled 2015-10-10: qty 1

## 2015-10-10 MED ORDER — ONDANSETRON HCL 4 MG/2ML IJ SOLN
4.0000 mg | Freq: Once | INTRAMUSCULAR | Status: AC
  Administered 2015-10-10: 4 mg via INTRAVENOUS
  Filled 2015-10-10: qty 2

## 2015-10-10 NOTE — ED Notes (Signed)
Pt unable to urinate at this time. Instructed to hit call bell when she needs to go.Family at bedside.Pt provided coffee and water, family offered but declines at this time.

## 2015-10-10 NOTE — ED Notes (Signed)
Pt and husband feel unsafe for discharge at this time due to the effects of morphine on patient. Pt given crackers and peanut butter at this time.

## 2015-10-10 NOTE — ED Notes (Signed)
Pt discharged home with husband. Pt alert and oriented X4, active, cooperative, pt in NAD. RR even and unlabored, color WNL.  Pt able to transfer from bed to wheelchair and to car with minimal assistance.

## 2015-10-10 NOTE — ED Notes (Addendum)
Pt refusing EKG at this time due to "interefering with stimulator" referring to Parkinson's stimulator. EKG on hold at this time.  Will address with MD.

## 2015-10-10 NOTE — ED Notes (Signed)
MD at bedside. 

## 2015-10-10 NOTE — Discharge Instructions (Signed)
Please be careful. Please follow-up with your regular doctor later this coming week. Please return for any new or worsening symptoms. Use Aleve or Tylenol for the pain as needed.

## 2015-10-10 NOTE — ED Triage Notes (Signed)
Pt arrives to ER via [facility] from home after fall in bathroom this AM. Pt c/o right sided rib cage pain since fall, no obvious injuries or deformities. Pt unsure if she tripped and fell or was dizzy and fell. Husband denied LOC to [facility]. Pt hx of Parkinsons X 17 years- reports lots of falls. Pt alert and oriented X4, active, cooperative, pt in NAD. RR even and unlabored, color WNL.

## 2015-10-10 NOTE — ED Notes (Signed)
MD at bedside to assess patient. Explained about risks of EKG and stimulator, EKG necessary. EKG performed with family and patient verbalizing that it is ok.

## 2015-10-10 NOTE — ED Provider Notes (Signed)
[facility] Emergency Department Provider Note   ____________________________________________   First MD Initiated Contact with Patient 10/10/15 1009     (approximate)  I have reviewed the triage vital signs and the nursing notes.   HISTORY  Chief Complaint Fall    HPI Kelsey Owens is a 75 y.o. female who reports she fell. She is not sure if she tripped or got dizzy. She reports she hit her head. She complains of headache neck pain and rib pain. It's all worse when she moves. She has a history of Parkinson's and falls frequently.   Past Medical History:  Diagnosis Date  . Arthritis   . GERD (gastroesophageal reflux disease)   . Parkinson disease ([place])     There are no active problems to display for this patient.   Past Surgical History:  Procedure Laterality Date  . ABDOMINAL HYSTERECTOMY    . CHOLECYSTECTOMY    . DEEP BRAIN STIMULATOR PLACEMENT    . HEMORROIDECTOMY      Prior to Admission medications   Medication Sig Start Date End Date Taking? Authorizing Provider  HYDROcodone-acetaminophen (NORCO) 5-325 MG per tablet Take 1 tablet by mouth every 6 (six) hours as needed for moderate pain or severe pain. 06/26/14   III William C Ruffian, PA-C  ondansetron (ZOFRAN ODT) 4 MG disintegrating tablet Take 1 tablet (4 mg total) by mouth every 8 (eight) hours as needed for nausea or vomiting. 01/21/15   Harvest Dark, MD  traMADol (ULTRAM) 50 MG tablet Take 1 tablet (50 mg total) by mouth every 6 (six) hours as needed. 01/21/15 01/21/16  Harvest Dark, MD    Allergies Levaquin [levofloxacin in d5w]; Penicillins; Thorazine [chlorpromazine]; and Ceftin [cefuroxime]  No family history on file.  Social History Social History  Substance Use Topics  . Smoking status: Never Smoker  . Smokeless tobacco: Not on file  . Alcohol use Yes     Comment: occasional    Review of Systems Constitutional: No fever/chills Eyes: No visual  changes. ENT: No sore throat. Cardiovascular: See history of present illness Respiratory: Denies shortness of breath. Gastrointestinal: No abdominal pain.  No nausea, no vomiting.  No diarrhea.  No constipation. Genitourinary: Negative for dysuria. Musculoskeletal: Negative for back pain. Skin: Negative for rash. Neurological: Negative for  focal weakness  10-point ROS otherwise negative.  ____________________________________________   PHYSICAL EXAM:  VITAL SIGNS: ED Triage Vitals  Enc Vitals Group     BP 10/10/15 1007 (!) 160/90     Pulse Rate 10/10/15 1007 86     Resp 10/10/15 1007 (!) 29     Temp 10/10/15 1008 97.3 F (36.3 C)     Temp Source 10/10/15 1008 Oral     SpO2 10/10/15 1007 97 %     Weight 10/10/15 0959 150 lb (68 kg)     Height 10/10/15 0959 5' 4"  (1.626 m)     Head Circumference --      Peak Flow --      Pain Score 10/10/15 0959 6     Pain Loc --      Pain Edu? --      Excl. in [place]? --     Constitutional: Alert and oriented. Well appearing and in no acute distress. Eyes: Conjunctivae are normal. PERRL. EOMI. Head: Atraumatic. Nose: No congestion/rhinnorhea. Mouth/Throat: Mucous membranes are moist.  Oropharynx non-erythematous. Neck: No stridor.   cervical spine tenderness to palpation. Cardiovascular: Normal rate, regular rhythm. Grossly normal heart sounds.  Good  peripheral circulation. Respiratory: Normal respiratory effort.  No retractions. Lungs CTAB. Gastrointestinal: Soft and nontender. No distention. No abdominal bruits. No CVA tenderness. Musculoskeletal: No lower extremity tenderness nor edema.  No joint effusions. Neurologic:  Normal speech and language. No gross focal neurologic deficits are appreciated. Skin:  Skin is warm, dry and intact. No rash noted.   ____________________________________________   LABS (all labs ordered are listed, but only abnormal results are displayed)  Labs Reviewed  BASIC METABOLIC PANEL - Abnormal; Notable  for the following:       Result Value   CO2 21 (*)    Glucose, Bld 125 (*)    BUN 34 (*)    Creatinine, Ser 1.16 (*)    GFR calc non Af Amer 45 (*)    GFR calc Af Amer 52 (*)    All other components within normal limits  GLUCOSE, CAPILLARY - Abnormal; Notable for the following:    Glucose-Capillary 120 (*)    All other components within normal limits  HEPATIC FUNCTION PANEL - Abnormal; Notable for the following:    ALT <5 (*)    All other components within normal limits  DIFFERENTIAL - Abnormal; Notable for the following:    Lymphs Abs 0.8 (*)    All other components within normal limits  CBC  TROPONIN I  URINALYSIS COMPLETEWITH MICROSCOPIC ([facility] ONLY)  CBG MONITORING, ED   ____________________________________________  EKG  EKG read and interpreted by me shows normal sinus rhythm rate of 83 normal axis no acute ST-T wave changes    ____________________________________________  RADIOLOGY  Chest x-ray head CT and neck CT read by radiology showed no acute disease ____________________________________________   PROCEDURES  Procedure(s) performed:   Procedures  Critical Care performed:   ____________________________________________   INITIAL IMPRESSION / ASSESSMENT AND PLAN / ED COURSE  Pertinent labs & imaging results that were available during my care of the patient were reviewed by me and considered in my medical decision making (see chart for details).    Clinical Course     ____________________________________________   FINAL CLINICAL IMPRESSION(S) / ED DIAGNOSES  Final diagnoses:  Fall, initial encounter      NEW MEDICATIONS STARTED DURING THIS VISIT:  New Prescriptions   No medications on file     Note:  This document was prepared using Dragon voice recognition software and may include unintentional dictation errors.    Nena Polio, MD 10/10/15 [phone]

## 2015-11-04 DIAGNOSIS — I471 Supraventricular tachycardia: Secondary | ICD-10-CM | POA: Insufficient documentation

## 2015-11-18 DIAGNOSIS — K5900 Constipation, unspecified: Secondary | ICD-10-CM | POA: Insufficient documentation

## 2016-03-22 ENCOUNTER — Other Ambulatory Visit: Payer: Self-pay | Admitting: Internal Medicine

## 2016-03-22 DIAGNOSIS — Z1231 Encounter for screening mammogram for malignant neoplasm of breast: Secondary | ICD-10-CM

## 2016-04-19 ENCOUNTER — Ambulatory Visit
Admission: RE | Admit: 2016-04-19 | Discharge: 2016-04-19 | Disposition: A | Discharge status: Home / Self Care | Payer: Medicare Other | Source: Ambulatory Visit | Attending: Internal Medicine | Admitting: Internal Medicine

## 2016-04-19 DIAGNOSIS — Z1231 Encounter for screening mammogram for malignant neoplasm of breast: Secondary | ICD-10-CM | POA: Insufficient documentation

## 2016-04-20 ENCOUNTER — Ambulatory Visit: Payer: Medicare PPO

## 2016-05-01 DIAGNOSIS — I10 Essential (primary) hypertension: Secondary | ICD-10-CM | POA: Insufficient documentation

## 2016-05-01 DIAGNOSIS — W19XXXD Unspecified fall, subsequent encounter: Secondary | ICD-10-CM | POA: Insufficient documentation

## 2016-05-01 DIAGNOSIS — R0782 Intercostal pain: Secondary | ICD-10-CM | POA: Insufficient documentation

## 2016-06-12 ENCOUNTER — Other Ambulatory Visit: Payer: Self-pay | Admitting: Student

## 2016-06-12 ENCOUNTER — Ambulatory Visit: Payer: Medicare PPO | Admitting: Speech Pathology

## 2016-06-12 DIAGNOSIS — K219 Gastro-esophageal reflux disease without esophagitis: Secondary | ICD-10-CM

## 2016-06-15 ENCOUNTER — Ambulatory Visit: Payer: Medicare PPO | Attending: Student

## 2016-06-16 ENCOUNTER — Encounter: Payer: Medicare PPO | Admitting: Speech Pathology

## 2016-06-20 ENCOUNTER — Encounter: Payer: Medicare PPO | Admitting: Speech Pathology

## 2016-06-23 ENCOUNTER — Encounter: Payer: Medicare PPO | Admitting: Speech Pathology

## 2016-06-27 ENCOUNTER — Ambulatory Visit
Admission: RE | Admit: 2016-06-27 | Discharge: 2016-06-27 | Disposition: A | Discharge status: Home / Self Care | Payer: Medicare Other | Source: Ambulatory Visit | Attending: Student | Admitting: Student

## 2016-06-27 ENCOUNTER — Encounter: Payer: Medicare PPO | Admitting: Speech Pathology

## 2016-06-27 DIAGNOSIS — K219 Gastro-esophageal reflux disease without esophagitis: Secondary | ICD-10-CM | POA: Diagnosis not present

## 2016-06-27 DIAGNOSIS — K5710 Diverticulosis of small intestine without perforation or abscess without bleeding: Secondary | ICD-10-CM | POA: Insufficient documentation

## 2016-06-30 ENCOUNTER — Encounter: Payer: Medicare PPO | Admitting: Speech Pathology

## 2016-07-04 ENCOUNTER — Encounter: Payer: Medicare PPO | Admitting: Speech Pathology

## 2016-07-05 ENCOUNTER — Emergency Department: Payer: Medicare Other

## 2016-07-05 ENCOUNTER — Encounter: Payer: Self-pay | Admitting: Emergency Medicine

## 2016-07-05 ENCOUNTER — Emergency Department
Admission: EM | Admit: 2016-07-05 | Discharge: 2016-07-06 | Disposition: A | Discharge status: Home / Self Care | Payer: Medicare Other | Attending: Student in an Organized Health Care Education/Training Program | Admitting: Student in an Organized Health Care Education/Training Program

## 2016-07-05 DIAGNOSIS — Y732 Prosthetic and other implants, materials and accessory gastroenterology and urology devices associated with adverse incidents: Secondary | ICD-10-CM | POA: Diagnosis not present

## 2016-07-05 DIAGNOSIS — Z960 Presence of urogenital implants: Secondary | ICD-10-CM

## 2016-07-05 DIAGNOSIS — R1031 Right lower quadrant pain: Secondary | ICD-10-CM | POA: Diagnosis present

## 2016-07-05 DIAGNOSIS — T83098A Other mechanical complication of other indwelling urethral catheter, initial encounter: Secondary | ICD-10-CM | POA: Diagnosis not present

## 2016-07-05 DIAGNOSIS — R1032 Left lower quadrant pain: Secondary | ICD-10-CM | POA: Insufficient documentation

## 2016-07-05 DIAGNOSIS — Z978 Presence of other specified devices: Secondary | ICD-10-CM

## 2016-07-05 LAB — COMPREHENSIVE METABOLIC PANEL
ALT: <5 U/L — ABNORMAL LOW (ref 14–54)
ANION GAP: 6 (ref 5–15)
AST: 22 U/L (ref 15–41)
Albumin: 4 g/dL (ref 3.5–5.0)
Alkaline Phosphatase: 107 U/L (ref 38–126)
BILIRUBIN TOTAL: 0.5 mg/dL (ref 0.3–1.2)
BUN: 27 mg/dL — AB (ref 6–20)
CHLORIDE: 109 mmol/L (ref 101–111)
CO2: 26 mmol/L (ref 22–32)
Calcium: 9.4 mg/dL (ref 8.9–10.3)
Creatinine, Ser: 1.21 mg/dL — ABNORMAL HIGH (ref 0.44–1.00)
GFR, EST AFRICAN AMERICAN: 49 mL/min — AB (ref >60)
GFR, EST NON AFRICAN AMERICAN: 42 mL/min — AB (ref >60)
Glucose, Bld: 127 mg/dL — ABNORMAL HIGH (ref 65–99)
POTASSIUM: 3.9 mmol/L (ref 3.5–5.1)
Sodium: 141 mmol/L (ref 135–145)
TOTAL PROTEIN: 7 g/dL (ref 6.5–8.1)

## 2016-07-05 LAB — CBC WITH DIFFERENTIAL/PLATELET
BASOS ABS: 0.1 10*3/uL (ref 0–0.1)
Basophils Relative: 0 %
EOS PCT: 1 %
Eosinophils Absolute: 0.1 10*3/uL (ref 0–0.7)
HCT: 40.2 % (ref 35.0–47.0)
Hemoglobin: 13.3 g/dL (ref 12.0–16.0)
LYMPHS PCT: 6 %
Lymphs Abs: 0.9 10*3/uL — ABNORMAL LOW (ref 1.0–3.6)
MCH: 30.1 pg (ref 26.0–34.0)
MCHC: 33.1 g/dL (ref 32.0–36.0)
MCV: 90.8 fL (ref 80.0–100.0)
MONO ABS: 1.1 10*3/uL — AB (ref 0.2–0.9)
MONOS PCT: 7 %
Neutro Abs: 13.5 10*3/uL — ABNORMAL HIGH (ref 1.4–6.5)
Neutrophils Relative %: 86 %
PLATELETS: 180 10*3/uL (ref 150–440)
RBC: 4.42 MIL/uL (ref 3.80–5.20)
RDW: 14.3 % (ref 11.5–14.5)
WBC: 15.7 10*3/uL — ABNORMAL HIGH (ref 3.6–11.0)

## 2016-07-05 LAB — URINALYSIS, COMPLETE (UACMP) WITH MICROSCOPIC
BILIRUBIN URINE: NEGATIVE
Bacteria, UA: NONE SEEN
Glucose, UA: NEGATIVE mg/dL
Ketones, ur: 5 mg/dL — AB
NITRITE: NEGATIVE
PH: 5 (ref 5.0–8.0)
Protein, ur: 100 mg/dL — AB
SPECIFIC GRAVITY, URINE: 1.028 (ref 1.005–1.030)

## 2016-07-05 LAB — LIPASE, BLOOD: LIPASE: 19 U/L (ref 11–51)

## 2016-07-05 MED ORDER — IOPAMIDOL (ISOVUE-300) INJECTION 61%
75.0000 mL | Freq: Once | INTRAVENOUS | Status: AC | PRN
  Administered 2016-07-05: 75 mL via INTRAVENOUS

## 2016-07-05 MED ORDER — IOPAMIDOL (ISOVUE-300) INJECTION 61%
30.0000 mL | Freq: Once | INTRAVENOUS | Status: AC
  Administered 2016-07-05: 30 mL via ORAL

## 2016-07-05 MED ORDER — IOPAMIDOL (ISOVUE-370) INJECTION 76%: 75.0000 mL | Freq: Once | INTRAVENOUS | Status: DC | PRN

## 2016-07-05 MED ORDER — MORPHINE SULFATE (PF) 4 MG/ML IV SOLN
4.0000 mg | Freq: Once | INTRAVENOUS | Status: AC
  Administered 2016-07-05: 4 mg via INTRAVENOUS
  Filled 2016-07-05: qty 1

## 2016-07-05 MED ORDER — SODIUM CHLORIDE 0.9 % IV BOLUS (SEPSIS)
1000.0000 mL | Freq: Once | INTRAVENOUS | Status: AC
  Administered 2016-07-05: 1000 mL via INTRAVENOUS

## 2016-07-05 NOTE — ED Triage Notes (Signed)
Pt to triage in wheelchair due to pain. Pt reports Dr. Eliberto Ivory placed a foley catheter in this AM due to urinary retention caused by stool impaction. Pt advised to go home, proceed with Enema and if pressure and pain in bladder continue after BM, report to ED.

## 2016-07-05 NOTE — ED Provider Notes (Signed)
[facility] Provider Note   CSN: [id number] Arrival date & time: 07/05/16  2142     History   Chief Complaint No chief complaint on file.   HPI Kelsey Owens is a 76 y.o. female hx of GERD, Parkinson disease, recurrent constipation and urinary retention here presenting with abdominal pain, possible fecal impaction. Patient states that she has chronic constipation and chronic difficulty urinating due to her Parkinson's disease. Patient states that her last bowel movement was about 4 days ago. She also has some cold urinating today so she went to see urologist, Dr. Rogers Blocker. She was thought to have urinary retention and a Foley was placed in the office. She was also told to try an enema. She gave her some an enema and she had some stool came out but she still was in a lot of pain. Denies any vomiting or fevers. She has previous abdominal surgeries but denies any history of SBO.    The history is provided by the patient.    Past Medical History:  Diagnosis Date  . Arthritis   . GERD (gastroesophageal reflux disease)   . Parkinson disease ([place])     There are no active problems to display for this patient.   Past Surgical History:  Procedure Laterality Date  . ABDOMINAL HYSTERECTOMY    . CHOLECYSTECTOMY    . DEEP BRAIN STIMULATOR PLACEMENT    . HEMORROIDECTOMY      OB History    No data available       Home Medications    Prior to Admission medications   Medication Sig Start Date End Date Taking? Authorizing Provider  HYDROcodone-acetaminophen (NORCO) 5-325 MG per tablet Take 1 tablet by mouth every 6 (six) hours as needed for moderate pain or severe pain. 06/26/14   Ruffian, III Luanna Cole, PA-C  ondansetron (ZOFRAN ODT) 4 MG disintegrating tablet Take 1 tablet (4 mg total) by mouth every 8 (eight) hours as needed for nausea or vomiting. 01/21/15   Harvest Dark, MD    Family History Family History  Problem Relation Age of Onset  . Breast cancer  Maternal Aunt   . Breast cancer Paternal Grandmother     Social History Social History  Substance Use Topics  . Smoking status: Never Smoker  . Smokeless tobacco: Never Used  . Alcohol use Yes     Comment: occasional     Allergies   Levaquin [levofloxacin in d5w]; Penicillins; Thorazine [chlorpromazine]; and Ceftin [cefuroxime]   Review of Systems Review of Systems  Gastrointestinal: Positive for abdominal pain.  All other systems reviewed and are negative.    Physical Exam Updated Vital Signs BP (!) 154/67 (BP Location: Right Arm)   Pulse (!) 109   Temp 98.5 F (36.9 C) (Oral)   Resp 16   Ht 5' 4"  (1.626 m)   Wt 68 kg (150 lb)   SpO2 100%   BMI 25.75 kg/m   Physical Exam  Constitutional:  Uncomfortable   HENT:  Head: Normocephalic.  Mouth/Throat: Oropharynx is clear and moist.  Eyes: EOM are normal. Pupils are equal, round, and reactive to light.  Neck: Normal range of motion. Neck supple.  Cardiovascular: Normal rate, regular rhythm and normal heart sounds.   Pulmonary/Chest: Effort normal and breath sounds normal. No respiratory distress. She has no wheezes. She has no rales.  Abdominal: Soft. Bowel sounds are normal.  Mild diffuse lower abdominal tenderness, no rebound. Foley in place   Genitourinary:  Genitourinary Comments: Rectal- no stool  in the vault, no impaction   Musculoskeletal: Normal range of motion.  Neurological: She is alert. No cranial nerve deficit. Coordination normal.  Skin: Skin is warm.  Psychiatric: She has a normal mood and affect.  Nursing note and vitals reviewed.    ED Treatments / Results  Labs (all labs ordered are listed, but only abnormal results are displayed) Labs Reviewed  URINE CULTURE  CBC WITH DIFFERENTIAL/PLATELET  COMPREHENSIVE METABOLIC PANEL  LIPASE, BLOOD  URINALYSIS, COMPLETE (UACMP) WITH MICROSCOPIC    EKG  EKG Interpretation None       Radiology No results found.  Procedures Procedures  (including critical care time)  Medications Ordered in ED Medications  sodium chloride 0.9 % bolus 1,000 mL (not administered)  morphine 4 MG/ML injection 4 mg (not administered)     Initial Impression / Assessment and Plan / ED Course  I have reviewed the triage vital signs and the nursing notes.  Pertinent labs & imaging results that were available during my care of the patient were reviewed by me and considered in my medical decision making (see chart for details).     Kelsey Owens is a 76 y.o. female here with lower abdominal pain. Has foley in place and is draining. No obvious stool impaction. Has previous abdominal surgeries, consider SBO vs colitis vs appy vs UTI vs bladder spasms. Will get labs, UA, acute abdominal series. May need CT if nondiagnostic.   11:00 PM xrays showed mild constipation. However, she seemed to be in much more pain than I expect. Will get CT ab/pel and labs, which are pending. If they are unremarkable, anticipate discharge. Signed out to Dr. Quentin Cornwall in the ED.   Final Clinical Impressions(s) / ED Diagnoses   Final diagnoses:  None    New Prescriptions New Prescriptions   No medications on file     Drenda Freeze, MD 07/05/16 2301

## 2016-07-05 NOTE — ED Notes (Signed)
CT called and notified pt done with contrast drinks

## 2016-07-06 MED ORDER — DOXYCYCLINE HYCLATE 50 MG PO CAPS: 100.0000 mg | ORAL_CAPSULE | Freq: Two times a day (BID) | ORAL | 0 refills | Status: AC

## 2016-07-06 MED ORDER — MAGNESIUM CITRATE PO SOLN: 1.0000 | Freq: Once | ORAL | 0 refills | Status: AC

## 2016-07-06 MED ORDER — SIMETHICONE 80 MG PO CHEW: 80.0000 mg | CHEWABLE_TABLET | Freq: Four times a day (QID) | ORAL | 0 refills | Status: DC | PRN

## 2016-07-06 NOTE — ED Provider Notes (Signed)
Patient received in sign-out from Dr. Darl Householder.  Workup and evaluation pending results of labs, urine and CT imaging.  CT without any evidence of acute abnormality. Does have mild sigmoid colon thickening the patient denies any symptoms to suggest that this is the etiology of her discomfort.  No evidence of diverticulitis. No significant obstruction or fecal impaction. Urine shows no bacteria but is positive for leukocytes and red blood cells. We'll send for culture is a do not feel this represents acute cystitis given recent Foley placement  patient does have a mild leukocytosis but is afebrile and is otherwise well-appearing. Could be secondary to Foley insertion or pain. Patient has good follow-up with PCP and urology. This provided for the patient is stable for discharge home. Discussed signs and symptoms for which she should return to the ER.  Have discussed with the patient and available family all diagnostics and treatments performed thus far and all questions were answered to the best of my ability. The patient demonstrates understanding and agreement with plan.       Merlyn Lot, MD 07/06/16 [phone]

## 2016-07-06 NOTE — Discharge Instructions (Signed)

## 2016-07-07 LAB — URINE CULTURE: CULTURE: NO GROWTH

## 2016-07-12 ENCOUNTER — Encounter: Payer: Medicare PPO | Admitting: Speech Pathology

## 2016-07-14 ENCOUNTER — Encounter: Payer: Medicare PPO | Admitting: Speech Pathology

## 2016-07-18 ENCOUNTER — Ambulatory Visit: Payer: Medicare Other | Attending: Neurology | Admitting: Speech Pathology

## 2016-07-18 DIAGNOSIS — R490 Dysphonia: Secondary | ICD-10-CM | POA: Insufficient documentation

## 2016-07-19 ENCOUNTER — Encounter: Payer: Self-pay | Admitting: Speech Pathology

## 2016-07-19 ENCOUNTER — Ambulatory Visit: Payer: Medicare Other | Admitting: Speech Pathology

## 2016-07-19 NOTE — Therapy (Signed)
[facility] MAIN [facility] SERVICES [address] [place], [place], [address] Phone: [phone]   Fax:  [phone]  Speech Language Pathology Evaluation  Patient Details  Name: Kelsey Owens MRN: [id number] Date of Birth: [birth year] Referring Provider: Johny Blamer  Encounter Date: 07/18/2016      End of Session - 07/19/16 1342    Visit Number 1   Number of Visits 17   Date for SLP Re-Evaluation 08/18/16   SLP Start Time 1500   SLP Stop Time  1555   SLP Time Calculation (min) 55 min   Activity Tolerance Patient tolerated treatment well      Past Medical History:  Diagnosis Date  . Arthritis   . GERD (gastroesophageal reflux disease)   . Parkinson disease [facility])     Past Surgical History:  Procedure Laterality Date  . ABDOMINAL HYSTERECTOMY    . CHOLECYSTECTOMY    . DEEP BRAIN STIMULATOR PLACEMENT    . HEMORROIDECTOMY      There were no vitals filed for this visit.          SLP Evaluation [facility] - 07/19/16 0001      SLP Visit Information   SLP Received On 07/18/16   Referring Provider Jerold Coombe L   Onset Date 05/19/2016   Medical Diagnosis Parkinson's disease     Subjective   Subjective "my husband needs his hearing checked"   Patient/Family Stated Goal "Loud enough to be heard"     Pain Assessment   Pain Score 0-No pain     General Information   HPI 76 year old woman with Parkinson's disease, s/p bilateral DBS, with moderate hypophonia.  Patient had a course of LSVT-LOUD 5 or 6 years ago.     Prior Functional Status   Cognitive/Linguistic Baseline Baseline deficits   Baseline deficit details Parkinson's with hypophonia for many years     Oral Motor/Sensory Function   Overall Oral Motor/Sensory Function Appears within functional limits for tasks assessed     Motor Speech   Overall Motor Speech Impaired   Respiration Impaired   Level of Impairment Conversation   Phonation Low vocal intensity;Hoarse   Resonance Within functional limits   Articulation Within functional limitis   Intelligibility --  Intelligible when audible   Phonation Impaired   Tension Present Jaw;Neck;Shoulder   Volume Soft   Pitch Appropriate     Standardized Assessments   Standardized Assessments  Other Assessment  LSVT-LOUD Perceptual Voice Evaluation       LSVT-LOUD Voice Evaluation Maximum phonation time for sustained "ah": 11 seconds Mean intensity during sustained "ah": 72 dB  Mean intensity sustained during conversational speech: 63 dB Highest dynamic pitch when altering pitch from a low note to a high note: 404 Hz Highest pitch during conversational speech: 481 Hz Lowest dynamic pitch when altering from a high note to a low note: 170 Hz Lowest pitch during conversational speech: 125 Hz  Average fundamental frequency during sustained "ah": 1921Hz  (1.8 STD below mean for age and gender) Visi-Pitch: Pharmacist, community (MDVP)  MDVPT extracts objective quantitative values (Relative Average Perturbation, Shimmer, Voice Turbulence Index, and Noise to Harmonic Ratio) on sustained phonation, which are displayed graphically and numerically in comparison to a built-in normative database.  The patient exhibited values outside the norm for Relative Average Perturbation, Shimmer, Voice Turbulence Index, and Noise to Harmonic Ratio.  Average fundamental frequency was 1.8 STD below the average for age and gender. The patient improved all parameters  when cued to alter voicing ("loud like me").  Simulatability: Improved vocal quality with loud voice.      SLP Education - 08/04/16 1342    Education provided Yes   Education Details LSVT-LOUD protocol, availability of LSVT-BIG   Person(s) Educated Patient   Methods Explanation   Comprehension Verbalized understanding            SLP Long Term Goals - 08/04/16 1344      SLP LONG TERM GOAL #1   Title The patient will complete Daily Tasks (Maximum  duration "ah", High/Lows, and Functional Phrases) at average loudness of 80 dB and with loud, good quality voice.    Time 4   Period Weeks   Status New     SLP LONG TERM GOAL #2   Title The patient will complete Hierarchal Speech Loudness reading drills (words/phrases, sentences, and paragraph) at average 75 dB and with loud, good quality voice.     Time 4   Period Weeks   Status New     SLP LONG TERM GOAL #3   Title The patient will participate in conversation, maintaining average loudness of 75 dB and loud, good quality voice.   Time 4   Period Weeks   Status New     SLP LONG TERM GOAL #4   Title The patient will complete homework daily.   Time 4   Period Weeks   Status New          Plan - 08-04-16 1343    Clinical Impression Statement This 76 year old woman with diagnosed Parkinson's disease and s/p deep brain stimulation, is presenting with moderate voice disorder characterized by hoarse vocal quality, hypophonia, and monotone voice.  Based on stimulability testing, the patient is judged to be a good candidate for the LSVT LOUD program.  It is recommended that the patient receive the LSVT LOUD program which is comprised of 16 intensive sessions (4 times per week for 4 weeks, one hour sessions).  Prognosis for improvement is good based on motivation, stimulability, and strong family support.  LSVT LOUD has been documented in the literature as efficacious for individuals with Parkinson's disease.  The patient has experienced multiple falls and may benefit from the LSVT-BIG program, available at [facility] as well as the LSVT-LOUD program.   Speech Therapy Frequency 4x / week   Duration 4 weeks   Treatment/Interventions SLP instruction and feedback;Patient/family education;Other (comment)  LSVT-LOUD   Potential to Achieve Goals Good   Potential Considerations Ability to learn/carryover information;Co-morbidities;Cooperation/participation level;Medical prognosis;Pain  level;Previous level of function;Severity of impairments;Family/community support   SLP Home Exercise Plan LSVT-LOUD daily homework   Consulted and Agree with Plan of Care Patient      Patient will benefit from skilled therapeutic intervention in order to improve the following deficits and impairments:   Dysphonia - Plan: SLP plan of care cert/re-cert      G-Codes - 08/04/16 1348    Functional Assessment Tool Used Perceptual Voice Evaluation, clinical judgment   Functional Limitations Voice   Voice Current Status (G9171) At least 40 percent but less than 60 percent impaired, limited or restricted   Voice Goal Status (G9172) At least 20 percent but less than 40 percent impaired, limited or restricted      Problem List There are no active problems to display for this patient.  Dollene Primrose, MS/CCC- SLP  Leandrew Koyanagi 2016-08-04, 1:50 PM  [facility] MAIN [facility] SERVICES [address] [place], [place], [address] Phone: [phone]   Fax:  [phone]  Name: Analys Knuteson MRN: [id number] Date of Birth: 10-14-1940

## 2016-07-20 ENCOUNTER — Ambulatory Visit: Payer: Medicare Other | Admitting: Speech Pathology

## 2016-07-20 DIAGNOSIS — R490 Dysphonia: Secondary | ICD-10-CM

## 2016-07-21 ENCOUNTER — Encounter: Payer: Self-pay | Admitting: Speech Pathology

## 2016-07-21 ENCOUNTER — Ambulatory Visit: Payer: Medicare Other | Admitting: Speech Pathology

## 2016-07-21 DIAGNOSIS — R490 Dysphonia: Secondary | ICD-10-CM | POA: Diagnosis not present

## 2016-07-21 NOTE — Therapy (Signed)
[place] MAIN [facility] SERVICES [address] [place], [place], [address] Phone: [phone]   Fax:  [phone]  Speech Language Pathology Treatment  Patient Details  Name: Kelsey Owens MRN: [id number] Date of Birth: 76/15/1942 Referring Provider: Tempie Hoist  Encounter Date: 07/21/2016      End of Session - 07/21/16 1559    Visit Number 3   Number of Visits 17   Date for SLP Re-Evaluation 08/18/16   SLP Start Time 1450   SLP Stop Time  1545   SLP Time Calculation (min) 55 min   Activity Tolerance Patient tolerated treatment well      Past Medical History:  Diagnosis Date  . Arthritis   . GERD (gastroesophageal reflux disease)   . Parkinson disease [facility])     Past Surgical History:  Procedure Laterality Date  . ABDOMINAL HYSTERECTOMY    . CHOLECYSTECTOMY    . DEEP BRAIN STIMULATOR PLACEMENT    . HEMORROIDECTOMY      There were no vitals filed for this visit.      Subjective Assessment - 07/21/16 1558    Subjective Patient reports doing her homework with her husband   Currently in Pain? No/denies               ADULT SLP TREATMENT - 07/21/16 1557      General Information   Behavior/Cognition Alert;Cooperative;Pleasant mood   HPI 76 year old woman with Parkinson's disease, s/p bilateral DBS, with moderate hypophonia.  Patient had a course of LSVT-LOUD 5 or 6 years ago.     Treatment Provided   Treatment provided Cognitive-Linquistic     Pain Assessment   Pain Assessment No/denies pain     Cognitive-Linquistic Treatment   Treatment focused on Voice   Skilled Treatment Daily Task #1 (Maximum sustained "ah"): Average 10 seconds, 82 dB. Daily Task 2 (Maximum fundamental frequency range): Highs: 15 high pitched "ah" given mod-max cues. Lows: 15 low pitched "ah" given mod-max cues. Daily task #3 (Maximum speech loudness drill of functional phrases): Average 72 dB.  Hierarchal speech loudness drill: generate  phrases/sentences to answer questions, 70 dB. Read sentences, 72 dB.  Homework: assignments given.  Off the cuff remarks: average 68 dB; up to 71 dB given reminders to be loud.       Assessment / Recommendations / Plan   Plan Continue with current plan of care     Progression Toward Goals   Progression toward goals Progressing toward goals          SLP Education - 07/21/16 1559    Education provided Yes   Education Details LSVT-LOUD   Person(s) Educated Patient   Methods Explanation   Comprehension Verbalized understanding            SLP Long Term Goals - 07/19/16 1344      SLP LONG TERM GOAL #1   Title The patient will complete Daily Tasks (Maximum duration "ah", High/Lows, and Functional Phrases) at average loudness of 80 dB and with loud, good quality voice.    Time 4   Period Weeks   Status New     SLP LONG TERM GOAL #2   Title The patient will complete Hierarchal Speech Loudness reading drills (words/phrases, sentences, and paragraph) at average 75 dB and with loud, good quality voice.     Time 4   Period Weeks   Status New     SLP LONG TERM GOAL #3   Title The  patient will participate in conversation, maintaining average loudness of 75 dB and loud, good quality voice.   Time 4   Period Weeks   Status New     SLP LONG TERM GOAL #4   Title The patient will complete homework daily.   Time 4   Period Weeks   Status New          Plan - 07/21/16 1600    Clinical Impression Statement The patient is completing daily tasks and hierarchal speech drill tasks with loud, good quality voice given fewer SLP cues.     Speech Therapy Frequency 4x / week   Duration 4 weeks   Treatment/Interventions SLP instruction and feedback;Patient/family education;Other (comment)  LSVT-LOUD   Potential to Achieve Goals Good   Potential Considerations Ability to learn/carryover information;Co-morbidities;Cooperation/participation level;Medical prognosis;Pain level;Previous level  of function;Severity of impairments;Family/community support   SLP Home Exercise Plan LSVT-LOUD daily homework   Consulted and Agree with Plan of Care Patient      Patient will benefit from skilled therapeutic intervention in order to improve the following deficits and impairments:   Dysphonia    Problem List There are no active problems to display for this patient.  Leroy Sea, [place], [name] 07/21/2016, 4:01 PM  [place] MAIN [facility] SERVICES [address] [place], [place], [address] Phone: [phone]   Fax:  [phone]   Name: Kelsey Owens MRN: [id number] Date of Birth: December 18, 1974

## 2016-07-21 NOTE — Therapy (Signed)
[place] MAIN [facility] SERVICES [address] [place], [place], [address] Phone: [phone]   Fax:  [phone]  Speech Language Pathology Treatment  Patient Details  Name: Kelsey Owens MRN: [id number] Date of Birth: [birth year] Referring Provider: Tempie Hoist  Encounter Date: 07/20/2016      End of Session - 07/21/16 0801    Visit Number 2   Number of Visits 17   Date for SLP Re-Evaluation 08/18/16   SLP Start Time 1500   SLP Stop Time  1555   SLP Time Calculation (min) 55 min   Activity Tolerance Patient tolerated treatment well      Past Medical History:  Diagnosis Date  . Arthritis   . GERD (gastroesophageal reflux disease)   . Parkinson disease [facility])     Past Surgical History:  Procedure Laterality Date  . ABDOMINAL HYSTERECTOMY    . CHOLECYSTECTOMY    . DEEP BRAIN STIMULATOR PLACEMENT    . HEMORROIDECTOMY      There were no vitals filed for this visit.             ADULT SLP TREATMENT - 07/21/16 0001      General Information   Behavior/Cognition Alert;Cooperative;Pleasant mood   HPI 76 year old woman with Parkinson's disease, s/p bilateral DBS, with moderate hypophonia.  Patient had a course of LSVT-LOUD 5 or 6 years ago.     Treatment Provided   Treatment provided Cognitive-Linquistic     Pain Assessment   Pain Assessment No/denies pain     Cognitive-Linquistic Treatment   Treatment focused on Voice   Skilled Treatment Daily Task #1 (Maximum sustained "ah"): Average 10 seconds, 82 dB. Daily Task 2 (Maximum fundamental frequency range): Highs: 15 high pitched "ah" given mod-max cues. Lows: 15 low pitched "ah" given mod-max cues. Daily task #3 (Maximum speech loudness drill of functional phrases): Average 72 dB.  Hierarchal speech loudness drill: Automatic speech series, 74 dB. Read sentences, 71 dB.  Homework: assignments given.  Off the cuff remarks: average 68 dB; up to 71 dB given reminders to be  loud.       Assessment / Recommendations / Plan   Plan Continue with current plan of care     Progression Toward Goals   Progression toward goals Progressing toward goals          SLP Education - 07/21/16 0801    Education provided Yes   Education Details LSVT-LOUD   Person(s) Educated Patient   Methods Explanation   Comprehension Verbalized understanding            SLP Long Term Goals - 07/19/16 1344      SLP LONG TERM GOAL #1   Title The patient will complete Daily Tasks (Maximum duration "ah", High/Lows, and Functional Phrases) at average loudness of 80 dB and with loud, good quality voice.    Time 4   Period Weeks   Status New     SLP LONG TERM GOAL #2   Title The patient will complete Hierarchal Speech Loudness reading drills (words/phrases, sentences, and paragraph) at average 75 dB and with loud, good quality voice.     Time 4   Period Weeks   Status New     SLP LONG TERM GOAL #3   Title The patient will participate in conversation, maintaining average loudness of 75 dB and loud, good quality voice.   Time 4   Period Weeks   Status New  SLP LONG TERM GOAL #4   Title The patient will complete homework daily.   Time 4   Period Weeks   Status New          Plan - 07/21/16 0802    Clinical Impression Statement The patient is completing daily tasks and hierarchal speech drill tasks with loud, good quality voice given mod-max SLP cues.     Speech Therapy Frequency 4x / week   Duration 4 weeks   Treatment/Interventions SLP instruction and feedback;Patient/family education;Other (comment)  LSVT-LOUD   Potential to Achieve Goals Good   Potential Considerations Ability to learn/carryover information;Co-morbidities;Cooperation/participation level;Medical prognosis;Pain level;Previous level of function;Severity of impairments;Family/community support   SLP Home Exercise Plan LSVT-LOUD daily homework   Consulted and Agree with Plan of Care Patient       Patient will benefit from skilled therapeutic intervention in order to improve the following deficits and impairments:   Dysphonia    Problem List There are no active problems to display for this patient.  Kelsey Sea, MS/CCC- SLP  [name] 07/21/2016, 8:03 AM  [place] MAIN [facility] SERVICES [address] [place], [place], [address] Phone: [phone]   Fax:  [phone]   Name: Kelsey Owens MRN: [id number] Date of Birth: 1941-01-31

## 2016-07-25 ENCOUNTER — Encounter: Payer: Self-pay | Admitting: Speech Pathology

## 2016-07-25 ENCOUNTER — Encounter: Payer: Medicare PPO | Admitting: Speech Pathology

## 2016-07-25 ENCOUNTER — Ambulatory Visit: Payer: Medicare Other | Admitting: Speech Pathology

## 2016-07-25 DIAGNOSIS — R490 Dysphonia: Secondary | ICD-10-CM

## 2016-07-25 NOTE — Therapy (Signed)
[place] MAIN [facility] SERVICES [address] [place], [place], [address] Phone: [phone]   Fax:  [phone]  Speech Language Pathology Treatment  Patient Details  Name: Kelsey Owens MRN: [id number] Date of Birth: 1940/05/23 Referring Provider: Tempie Hoist  Encounter Date: 07/25/2016      End of Session - 07/25/16 1610    Visit Number 4   Number of Visits 17   Date for SLP Re-Evaluation 08/18/16   SLP Start Time 1500   SLP Stop Time  1551   SLP Time Calculation (min) 51 min   Activity Tolerance Patient tolerated treatment well      Past Medical History:  Diagnosis Date  . Arthritis   . GERD (gastroesophageal reflux disease)   . Parkinson disease [facility])     Past Surgical History:  Procedure Laterality Date  . ABDOMINAL HYSTERECTOMY    . CHOLECYSTECTOMY    . DEEP BRAIN STIMULATOR PLACEMENT    . HEMORROIDECTOMY      There were no vitals filed for this visit.      Subjective Assessment - 07/25/16 1609    Subjective patient reports that she is feeling fatigued today   Currently in Pain? No/denies               ADULT SLP TREATMENT - 07/25/16 0001      General Information   Behavior/Cognition Alert;Cooperative;Pleasant mood   HPI 76 year old woman with Parkinson's disease, s/p bilateral DBS, with moderate hypophonia.  Patient had a course of LSVT-LOUD 5 or 6 years ago.     Treatment Provided   Treatment provided Cognitive-Linquistic     Pain Assessment   Pain Assessment No/denies pain     Cognitive-Linquistic Treatment   Treatment focused on Voice   Skilled Treatment Daily Task #1 (Maximum sustained "ah"): Average 9 seconds, 80 dB. Daily Task 2 (Maximum fundamental frequency range): Highs: 15 high pitched "ah" given mod-max cues. Lows: 15 low pitched "ah" given mod-max cues. Daily task #3 (Maximum speech loudness drill of functional phrases): Average 70 dB.  Hierarchal speech loudness drill: generate  phrases/sentences to answer questions, 69 dB. Read sentences, 68 dB.  Homework: assignments given.  Off the cuff remarks: average 68 dB; up to 71 dB given reminders to be loud.       Assessment / Recommendations / Plan   Plan Continue with current plan of care     Progression Toward Goals   Progression toward goals Progressing toward goals          SLP Education - 07/25/16 1610    Education provided Yes   Education Details LSVT-LOUD   Person(s) Educated Patient   Methods Explanation   Comprehension Verbalized understanding            SLP Long Term Goals - 07/19/16 1344      SLP LONG TERM GOAL #1   Title The patient will complete Daily Tasks (Maximum duration "ah", High/Lows, and Functional Phrases) at average loudness of 80 dB and with loud, good quality voice.    Time 4   Period Weeks   Status New     SLP LONG TERM GOAL #2   Title The patient will complete Hierarchal Speech Loudness reading drills (words/phrases, sentences, and paragraph) at average 75 dB and with loud, good quality voice.     Time 4   Period Weeks   Status New     SLP LONG TERM GOAL #3   Title The  patient will participate in conversation, maintaining average loudness of 75 dB and loud, good quality voice.   Time 4   Period Weeks   Status New     SLP LONG TERM GOAL #4   Title The patient will complete homework daily.   Time 4   Period Weeks   Status New          Plan - 07/25/16 1611    Clinical Impression Statement The patient is completing daily tasks and hierarchal speech drill tasks with loud, good quality voice given SLP cues.  Patient was more tired today and required more cuing to stay loud.   Speech Therapy Frequency 4x / week   Duration 4 weeks   Treatment/Interventions SLP instruction and feedback;Patient/family education;Other (comment)   Potential to Achieve Goals Good   Potential Considerations Ability to learn/carryover information;Co-morbidities;Cooperation/participation  level;Medical prognosis;Pain level;Previous level of function;Severity of impairments;Family/community support   SLP Home Exercise Plan LSVT-LOUD daily homework   Consulted and Agree with Plan of Care Patient      Patient will benefit from skilled therapeutic intervention in order to improve the following deficits and impairments:   Dysphonia    Problem List There are no active problems to display for this patient.  Leroy Sea, MS/CCC- SLP  [name] 07/25/2016, [place] MAIN [facility] SERVICES [address] [place], [place], [address] Phone: [phone]   Fax:  [phone]   Name: Kelsey Owens MRN: [id number] Date of Birth: Jan 23, 1941

## 2016-07-26 ENCOUNTER — Encounter: Payer: Self-pay | Admitting: Speech Pathology

## 2016-07-26 ENCOUNTER — Ambulatory Visit: Payer: Medicare Other | Admitting: Speech Pathology

## 2016-07-26 DIAGNOSIS — R490 Dysphonia: Secondary | ICD-10-CM | POA: Diagnosis not present

## 2016-07-26 NOTE — Therapy (Signed)
[place] MAIN [facility] SERVICES [address] [place], [place], [address] Phone: [phone]   Fax:  [phone]  Speech Language Pathology Treatment  Patient Details  Name: Kelsey Owens MRN: [id number] Date of Birth: [birth year] Referring Provider: Tempie Hoist  Encounter Date: 07/26/2016      End of Session - 07/26/16 1618    Visit Number 5   Number of Visits 17   Date for SLP Re-Evaluation 08/18/16   SLP Start Time 72   SLP Stop Time  8841   SLP Time Calculation (min) 54 min   Activity Tolerance Patient tolerated treatment well      Past Medical History:  Diagnosis Date  . Arthritis   . GERD (gastroesophageal reflux disease)   . Parkinson disease [facility])     Past Surgical History:  Procedure Laterality Date  . ABDOMINAL HYSTERECTOMY    . CHOLECYSTECTOMY    . DEEP BRAIN STIMULATOR PLACEMENT    . HEMORROIDECTOMY      There were no vitals filed for this visit.      Subjective Assessment - 07/26/16 1617    Subjective patient states she feels like she is yelling   Currently in Pain? No/denies               ADULT SLP TREATMENT - 07/26/16 0001      General Information   Behavior/Cognition Alert;Cooperative;Pleasant mood   HPI 76 year old woman with Parkinson's disease, s/p bilateral DBS, with moderate hypophonia.  Patient had a course of LSVT-LOUD 5 or 6 years ago.     Treatment Provided   Treatment provided Cognitive-Linquistic     Pain Assessment   Pain Assessment No/denies pain     Cognitive-Linquistic Treatment   Treatment focused on Voice   Skilled Treatment Daily Task #1 (Maximum sustained "ah"): Average 9 seconds, 80 dB. Daily Task 2 (Maximum fundamental frequency range): Highs: 15 high pitched "ah" given mod-max cues. Lows: 15 low pitched "ah" given mod-max cues. Daily task #3 (Maximum speech loudness drill of functional phrases): Average 73 dB.  Hierarchal speech loudness drill: generate words to  name pictured objects, 72 dB. Homework: assignments given.  Off the cuff remarks: average 68 dB; up to 71 dB given reminders to be loud.       Assessment / Recommendations / Plan   Plan Continue with current plan of care     Progression Toward Goals   Progression toward goals Progressing toward goals          SLP Education - 07/26/16 1617    Education provided Yes   Education Details LSVT-LOUD   Person(s) Educated Patient   Methods Explanation   Comprehension Verbalized understanding            SLP Long Term Goals - 07/19/16 1344      SLP LONG TERM GOAL #1   Title The patient will complete Daily Tasks (Maximum duration "ah", High/Lows, and Functional Phrases) at average loudness of 80 dB and with loud, good quality voice.    Time 4   Period Weeks   Status New     SLP LONG TERM GOAL #2   Title The patient will complete Hierarchal Speech Loudness reading drills (words/phrases, sentences, and paragraph) at average 75 dB and with loud, good quality voice.     Time 4   Period Weeks   Status New     SLP LONG TERM GOAL #3   Title The patient will participate in  conversation, maintaining average loudness of 75 dB and loud, good quality voice.   Time 4   Period Weeks   Status New     SLP LONG TERM GOAL #4   Title The patient will complete homework daily.   Time 4   Period Weeks   Status New          Plan - 07/26/16 1618    Clinical Impression Statement The patient is completing daily tasks and hierarchal speech drill tasks with loud, good quality voice given SLP cues.  Patient feels that she is shouting when using her loud, good quality voice.   Speech Therapy Frequency 4x / week   Duration 4 weeks   Treatment/Interventions SLP instruction and feedback;Patient/family education;Other (comment)  LSVT-LOUD   Potential to Achieve Goals Good   Potential Considerations Ability to learn/carryover information;Co-morbidities;Cooperation/participation level;Medical  prognosis;Pain level;Previous level of function;Severity of impairments;Family/community support   SLP Home Exercise Plan LSVT-LOUD daily homework   Consulted and Agree with Plan of Care Patient      Patient will benefit from skilled therapeutic intervention in order to improve the following deficits and impairments:   Dysphonia    Problem List There are no active problems to display for this patient.  Leroy Sea, MS/CCC- SLP  [name] 07/26/2016, 4:19 PM  [place] MAIN [facility] SERVICES [address] [place], [place], [address] Phone: [phone]   Fax:  [phone]   Name: Rogelio Winbush MRN: [id number] Date of Birth: January 04, 1941

## 2016-07-27 ENCOUNTER — Encounter: Payer: Medicare PPO | Admitting: Speech Pathology

## 2016-07-27 ENCOUNTER — Ambulatory Visit: Payer: Medicare Other | Admitting: Speech Pathology

## 2016-07-27 DIAGNOSIS — R490 Dysphonia: Secondary | ICD-10-CM | POA: Diagnosis not present

## 2016-07-28 ENCOUNTER — Ambulatory Visit: Payer: Medicare Other | Admitting: Speech Pathology

## 2016-07-28 ENCOUNTER — Encounter: Payer: Self-pay | Admitting: Speech Pathology

## 2016-07-28 DIAGNOSIS — R490 Dysphonia: Secondary | ICD-10-CM | POA: Diagnosis not present

## 2016-07-28 NOTE — Therapy (Signed)
[place] MAIN [facility] SERVICES [address] [place], [place], [address] Phone: [phone]   Fax:  [phone]  Speech Language Pathology Treatment  Patient Details  Name: Aiana Nordquist MRN: [id number] Date of Birth: 21-Oct-1940 Referring Provider: Tempie Hoist  Encounter Date: 07/27/2016      End of Session - 07/28/16 0811    Visit Number 6   Number of Visits 17   Date for SLP Re-Evaluation 08/18/16   SLP Start Time 1500   SLP Stop Time  1555   SLP Time Calculation (min) 55 min   Activity Tolerance Patient tolerated treatment well      Past Medical History:  Diagnosis Date  . Arthritis   . GERD (gastroesophageal reflux disease)   . Parkinson disease [facility])     Past Surgical History:  Procedure Laterality Date  . ABDOMINAL HYSTERECTOMY    . CHOLECYSTECTOMY    . DEEP BRAIN STIMULATOR PLACEMENT    . HEMORROIDECTOMY      There were no vitals filed for this visit.      Subjective Assessment - 07/28/16 0810    Subjective patient says she does not feel like she is yelling   Currently in Pain? No/denies               ADULT SLP TREATMENT - 07/28/16 0001      General Information   Behavior/Cognition Alert;Cooperative;Pleasant mood   HPI 76 year old woman with Parkinson's disease, s/p bilateral DBS, with moderate hypophonia.  Patient had a course of LSVT-LOUD 5 or 6 years ago.     Treatment Provided   Treatment provided Cognitive-Linquistic     Pain Assessment   Pain Assessment No/denies pain     Cognitive-Linquistic Treatment   Treatment focused on Voice   Skilled Treatment Daily Task #1 (Maximum sustained "ah"): Average 6 seconds, 82 dB. Daily Task 2 (Maximum fundamental frequency range): Highs: 15 high pitched "ah" given mod-max cues. Lows: 15 low pitched "ah" given mod-max cues. Daily task #3 (Maximum speech loudness drill of functional phrases): Average 74 dB.  Hierarchal speech loudness drill: read  sentences, 70 dB.  Phrase/sentence given picture stimulus, 68 dB, 68 dB- up to 73 dB given cues and models Homework: assignments given.  Off the cuff remarks: average 68 dB; up to 71 dB given reminders to be loud.       Assessment / Recommendations / Plan   Plan Continue with current plan of care     Progression Toward Goals   Progression toward goals Progressing toward goals          SLP Education - 07/28/16 0810    Education provided Yes   Education Details LSVT-LOUD   Person(s) Educated Patient   Methods Explanation   Comprehension Verbalized understanding            SLP Long Term Goals - 07/19/16 1344      SLP LONG TERM GOAL #1   Title The patient will complete Daily Tasks (Maximum duration "ah", High/Lows, and Functional Phrases) at average loudness of 80 dB and with loud, good quality voice.    Time 4   Period Weeks   Status New     SLP LONG TERM GOAL #2   Title The patient will complete Hierarchal Speech Loudness reading drills (words/phrases, sentences, and paragraph) at average 75 dB and with loud, good quality voice.     Time 4   Period Weeks   Status New  SLP LONG TERM GOAL #3   Title The patient will participate in conversation, maintaining average loudness of 75 dB and loud, good quality voice.   Time 4   Period Weeks   Status New     SLP LONG TERM GOAL #4   Title The patient will complete homework daily.   Time 4   Period Weeks   Status New          Plan - 07/28/16 4599    Clinical Impression Statement The patient is completing daily tasks and hierarchal speech drill tasks with loud, good quality voice given SLP cues.  Patient reports that she is not feeling as if she is shouting when using her loud, good quality voice.   Speech Therapy Frequency 4x / week   Duration 4 weeks   Treatment/Interventions SLP instruction and feedback;Patient/family education;Other (comment)  LSVT-LOUD   Potential to Achieve Goals Good   Potential  Considerations Ability to learn/carryover information;Co-morbidities;Cooperation/participation level;Medical prognosis;Pain level;Previous level of function;Severity of impairments;Family/community support   SLP Home Exercise Plan LSVT-LOUD daily homework   Consulted and Agree with Plan of Care Patient      Patient will benefit from skilled therapeutic intervention in order to improve the following deficits and impairments:   Dysphonia    Problem List There are no active problems to display for this patient.  Leroy Sea, MS/CCC- SLP  [name] 07/28/2016, 8:12 AM  [place] MAIN [facility] SERVICES [address] [place], [place], [address] Phone: [phone]   Fax:  [phone]   Name: Alec Mcphee MRN: [id number] Date of Birth: 11-01-1940

## 2016-07-28 NOTE — Therapy (Signed)
[place] MAIN [facility] SERVICES [address] [place], [place], [address] Phone: [phone]   Fax:  [phone]  Speech Language Pathology Treatment  Patient Details  Name: Kelsey Owens MRN: [id number] Date of Birth: 14-Sep-1940 Referring Provider: Tempie Hoist  Encounter Date: 07/28/2016      End of Session - 07/28/16 1300    Visit Number 7   Number of Visits 17   Date for SLP Re-Evaluation 08/18/16   SLP Start Time 1100   SLP Stop Time  1157   SLP Time Calculation (min) 57 min   Activity Tolerance Patient tolerated treatment well      Past Medical History:  Diagnosis Date  . Arthritis   . GERD (gastroesophageal reflux disease)   . Parkinson disease [facility])     Past Surgical History:  Procedure Laterality Date  . ABDOMINAL HYSTERECTOMY    . CHOLECYSTECTOMY    . DEEP BRAIN STIMULATOR PLACEMENT    . HEMORROIDECTOMY      There were no vitals filed for this visit.      Subjective Assessment - 07/28/16 1259    Subjective patient says she does not feel like she is yelling   Currently in Pain? No/denies               ADULT SLP TREATMENT - 07/28/16 1258      General Information   Behavior/Cognition Alert;Cooperative;Pleasant mood   HPI 76 year old woman with Parkinson's disease, s/p bilateral DBS, with moderate hypophonia.  Patient had a course of LSVT-LOUD 5 or 6 years ago.     Cognitive-Linquistic Treatment   Treatment focused on Voice   Skilled Treatment Daily Task #1 (Maximum sustained "ah"): Average 9 seconds, 82 dB. Daily Task 2 (Maximum fundamental frequency range): Highs: 15 high pitched "ah" given mod-max cues. Lows: 15 low pitched "ah" given mod-max cues. Daily task #3 (Maximum speech loudness drill of functional phrases): Average 74 dB.  Hierarchal speech loudness drill: read sentences, 70 dB.  Phrase/sentence to answer questions, 68 dB, 68 dB- up to 73 dB given cues and models Homework: assignments given.   Off the cuff remarks: average 68 dB; up to 71 dB given reminders to be loud.       Assessment / Recommendations / Plan   Plan Continue with current plan of care     Progression Toward Goals   Progression toward goals Progressing toward goals          SLP Education - 07/28/16 1259    Education provided Yes   Education Details LSVT-LOUD   Person(s) Educated Patient   Methods Explanation   Comprehension Verbalized understanding            SLP Long Term Goals - 07/19/16 1344      SLP LONG TERM GOAL #1   Title The patient will complete Daily Tasks (Maximum duration "ah", High/Lows, and Functional Phrases) at average loudness of 80 dB and with loud, good quality voice.    Time 4   Period Weeks   Status New     SLP LONG TERM GOAL #2   Title The patient will complete Hierarchal Speech Loudness reading drills (words/phrases, sentences, and paragraph) at average 75 dB and with loud, good quality voice.     Time 4   Period Weeks   Status New     SLP LONG TERM GOAL #3   Title The patient will participate in conversation, maintaining average loudness of 75 dB and  loud, good quality voice.   Time 4   Period Weeks   Status New     SLP LONG TERM GOAL #4   Title The patient will complete homework daily.   Time 4   Period Weeks   Status New          Plan - 07/28/16 1300    Clinical Impression Statement The patient is completing daily tasks and hierarchal speech drill tasks with loud, good quality voice given SLP cues.  Patient reports that she is not feeling as if she is shouting when using her loud, good quality voice.   Speech Therapy Frequency 4x / week   Duration 4 weeks   Treatment/Interventions SLP instruction and feedback;Patient/family education;Other (comment)  LSVT-LOUD   Potential to Achieve Goals Good   Potential Considerations Ability to learn/carryover information;Co-morbidities;Cooperation/participation level;Medical prognosis;Pain level;Previous level of  function;Severity of impairments;Family/community support   SLP Home Exercise Plan LSVT-LOUD daily homework   Consulted and Agree with Plan of Care Patient      Patient will benefit from skilled therapeutic intervention in order to improve the following deficits and impairments:   Dysphonia    Problem List There are no active problems to display for this patient.  Leroy Sea, MS/CCC- SLP  [name] 07/28/2016, 1:01 PM  [place] MAIN [facility] SERVICES [address] [place], [place], [address] Phone: [phone]   Fax:  [phone]   Name: Kelsey Owens MRN: [id number] Date of Birth: 06-27-1940

## 2016-08-01 ENCOUNTER — Ambulatory Visit: Payer: Medicare Other | Admitting: Speech Pathology

## 2016-08-01 ENCOUNTER — Encounter: Payer: Self-pay | Admitting: Speech Pathology

## 2016-08-01 DIAGNOSIS — R490 Dysphonia: Secondary | ICD-10-CM | POA: Diagnosis not present

## 2016-08-01 NOTE — Therapy (Signed)
[name] [facility] SERVICES [address] [place], [place], [address] Phone: [phone]   Fax:  [phone]  Speech Language Pathology Treatment  Patient Details  Name: Kelsey Owens MRN: [id number] Date of Birth: January 03, 1941 Referring Provider: Tempie Hoist  Encounter Date: 08/01/2016      End of Session - 08/01/16 1633    Visit Number 8   Number of Visits 17   Date for SLP Re-Evaluation 08/18/16   SLP Start Time 1504   SLP Stop Time  1600   SLP Time Calculation (min) 56 min   Activity Tolerance Patient tolerated treatment well      Past Medical History:  Diagnosis Date  . Arthritis   . GERD (gastroesophageal reflux disease)   . Parkinson disease [facility])     Past Surgical History:  Procedure Laterality Date  . ABDOMINAL HYSTERECTOMY    . CHOLECYSTECTOMY    . DEEP BRAIN STIMULATOR PLACEMENT    . HEMORROIDECTOMY      There were no vitals filed for this visit.      Subjective Assessment - 08/01/16 1627    Subjective patient says she does not feel like she is yelling               ADULT SLP TREATMENT - 08/01/16 0001      General Information   Behavior/Cognition Alert;Cooperative;Pleasant mood   HPI 76 year old woman with Parkinson's disease, s/p bilateral DBS, with moderate hypophonia.  Patient had a course of LSVT-LOUD 5 or 6 years ago.     Treatment Provided   Treatment provided Cognitive-Linquistic     Pain Assessment   Pain Assessment No/denies pain     Cognitive-Linquistic Treatment   Treatment focused on Voice   Skilled Treatment The patient is now able to release excessive tension for producing relaxed phonation.  She understands the concept of excessive tension affecting her voice and is able to perform the stretches and unvoiced exercises.  She states that she has been consistent with "my voice" for over 3 days.  The patient reports that she feels that she will be able to be in control of her  level of tension and release if needed.  She has my card if she experiences any difficulty or has questions regarding her voice.     Assessment / Recommendations / Plan   Plan Continue with current plan of care     Progression Toward Goals   Progression toward goals Progressing toward goals          SLP Education - 08/01/16 1632    Education provided Yes   Education Details LSVT-LOUD   Person(s) Educated Patient   Methods Explanation   Comprehension Verbalized understanding            SLP Long Term Goals - 07/19/16 1344      SLP LONG TERM GOAL #1   Title The patient will complete Daily Tasks (Maximum duration "ah", High/Lows, and Functional Phrases) at average loudness of 80 dB and with loud, good quality voice.    Time 4   Period Weeks   Status New     SLP LONG TERM GOAL #2   Title The patient will complete Hierarchal Speech Loudness reading drills (words/phrases, sentences, and paragraph) at average 75 dB and with loud, good quality voice.     Time 4   Period Weeks   Status New     SLP LONG TERM GOAL #3   Title The  patient will participate in conversation, maintaining average loudness of 75 dB and loud, good quality voice.   Time 4   Period Weeks   Status New     SLP LONG TERM GOAL #4   Title The patient will complete homework daily.   Time 4   Period Weeks   Status New          Plan - 08/01/16 1634    Clinical Impression Statement The patient is completing daily tasks and hierarchal speech drill tasks with loud, good quality voice given SLP cues.  Patient reports that she is not feeling as if she is shouting when using her loud, good quality voice.   Speech Therapy Frequency 4x / week   Duration 4 weeks   Treatment/Interventions SLP instruction and feedback;Patient/family education;Other (comment)  LSVT-LOUD   Potential to Achieve Goals Good   Potential Considerations Ability to learn/carryover information;Co-morbidities;Cooperation/participation  level;Medical prognosis;Pain level;Previous level of function;Severity of impairments;Family/community support   SLP Home Exercise Plan LSVT-LOUD daily homework   Consulted and Agree with Plan of Care Patient      Patient will benefit from skilled therapeutic intervention in order to improve the following deficits and impairments:   Dysphonia    Problem List There are no active problems to display for this patient.  Leroy Sea, MS/CCC- SLP  [name] 08/01/2016, 4:35 PM  [place] MAIN [facility] SERVICES [address] [place], [place], [address] Phone: [phone]   Fax:  [phone]   Name: Kelsey Owens MRN: [id number] Date of Birth: [birth year]

## 2016-08-02 ENCOUNTER — Encounter: Payer: Self-pay | Admitting: Speech Pathology

## 2016-08-02 ENCOUNTER — Ambulatory Visit: Payer: Medicare Other | Admitting: Speech Pathology

## 2016-08-02 DIAGNOSIS — R490 Dysphonia: Secondary | ICD-10-CM

## 2016-08-02 NOTE — Therapy (Signed)
[place] MAIN [facility] SERVICES [address] [place], [place], [address] Phone: [phone]   Fax:  [phone]  Speech Language Pathology Treatment  Patient Details  Name: Kelsey Owens MRN: [id number] Date of Birth: [birth year] Referring Provider: Tempie Hoist  Encounter Date: 08/02/2016      End of Session - 08/02/16 1714    Visit Number 9   Number of Visits 17   Date for SLP Re-Evaluation 08/18/16   SLP Start Time 1500   SLP Stop Time  1555   SLP Time Calculation (min) 55 min   Activity Tolerance Patient tolerated treatment well      Past Medical History:  Diagnosis Date  . Arthritis   . GERD (gastroesophageal reflux disease)   . Parkinson disease [facility])     Past Surgical History:  Procedure Laterality Date  . ABDOMINAL HYSTERECTOMY    . CHOLECYSTECTOMY    . DEEP BRAIN STIMULATOR PLACEMENT    . HEMORROIDECTOMY      There were no vitals filed for this visit.      Subjective Assessment - 08/02/16 1713    Subjective Patient greeted clinician with a loud "hello."   Currently in Pain? No/denies               ADULT SLP TREATMENT - 08/02/16 0001      General Information   Behavior/Cognition Alert;Cooperative;Pleasant mood   HPI 76 year old woman with Parkinson's disease, s/p bilateral DBS, with moderate hypophonia.  Patient had a course of LSVT-LOUD 5 or 6 years ago.     Treatment Provided   Treatment provided Cognitive-Linquistic     Pain Assessment   Pain Assessment No/denies pain     Cognitive-Linquistic Treatment   Treatment focused on Voice   Skilled Treatment Daily Task #1 (Maximum sustained "ah"): Average 6 seconds, 78 dB. Daily Task 2 (Maximum fundamental frequency range): Highs: 15 high pitched "ah" given min-mod cues. Lows: 15 low pitched "ah" given mod-max cues. Daily task #3 (Maximum speech loudness drill of functional phrases): Average 74 dB.  Hierarchal speech loudness drill: single words, 74  dB. Read sentences, 73 dB.  Phrase/sentence to answer questions, 66 dB, up to 73 dB given cues and models Homework: assignments given.  Off the cuff remarks: average 67 dB; up to 74 dB given reminders to be loud.       Assessment / Recommendations / Plan   Plan Continue with current plan of care     Progression Toward Goals   Progression toward goals Progressing toward goals          SLP Education - 08/02/16 1713    Education provided Yes   Education Details LSVT-LOUD   Person(s) Educated Patient   Methods Explanation;Demonstration   Comprehension Verbalized understanding;Returned demonstration            SLP Long Term Goals - 07/19/16 1344      SLP LONG TERM GOAL #1   Title The patient will complete Daily Tasks (Maximum duration "ah", High/Lows, and Functional Phrases) at average loudness of 80 dB and with loud, good quality voice.    Time 4   Period Weeks   Status New     SLP LONG TERM GOAL #2   Title The patient will complete Hierarchal Speech Loudness reading drills (words/phrases, sentences, and paragraph) at average 75 dB and with loud, good quality voice.     Time 4   Period Weeks   Status New  SLP LONG TERM GOAL #3   Title The patient will participate in conversation, maintaining average loudness of 75 dB and loud, good quality voice.   Time 4   Period Weeks   Status New     SLP LONG TERM GOAL #4   Title The patient will complete homework daily.   Time 4   Period Weeks   Status New          Plan - 08/02/16 1714    Clinical Impression Statement The patient is completing daily tasks and hierarchal speech drill tasks with loud, good quality voice given SLP cues. Patient still uses a much quieter voice in conversation and in "off the cuff" remarks compared to her daily tasks. Clinician will continue to encourage increased loudness in all parts of the session.   Speech Therapy Frequency 4x / week   Duration 4 weeks   Treatment/Interventions SLP  instruction and feedback;Patient/family education;Other (comment)   Potential to Achieve Goals Good   Potential Considerations Ability to learn/carryover information;Co-morbidities;Cooperation/participation level;Medical prognosis;Pain level;Previous level of function;Severity of impairments;Family/community support   SLP Home Exercise Plan LSVT-LOUD daily homework   Consulted and Agree with Plan of Care Patient      Patient will benefit from skilled therapeutic intervention in order to improve the following deficits and impairments:   Dysphonia    Problem List There are no active problems to display for this patient.   Aline August 08/02/2016, 5:15 PM  [place] MAIN [facility] SERVICES [address] [place], [place], [address] Phone: [phone]   Fax:  [phone]   Name: Kelsey Owens MRN: [id number] Date of Birth: [birth year]

## 2016-08-03 ENCOUNTER — Ambulatory Visit: Payer: Medicare Other | Admitting: Speech Pathology

## 2016-08-03 ENCOUNTER — Encounter: Payer: Self-pay | Admitting: Speech Pathology

## 2016-08-03 DIAGNOSIS — R490 Dysphonia: Secondary | ICD-10-CM

## 2016-08-03 NOTE — Therapy (Signed)
[place] MAIN [facility] SERVICES [address] [place], [place], [address] Phone: [phone]   Fax:  [phone]  Speech Language Pathology Treatment  Patient Details  Name: Kelsey Owens MRN: [id number] Date of Birth: 06/17/1940 Referring Provider: Tempie Hoist  Encounter Date: 08/03/2016      End of Session - 08/03/16 1644    Visit Number 10   Number of Visits 17   Date for SLP Re-Evaluation 08/18/16   SLP Start Time 61   SLP Stop Time  1600   SLP Time Calculation (min) 55 min   Activity Tolerance Patient tolerated treatment well      Past Medical History:  Diagnosis Date  . Arthritis   . GERD (gastroesophageal reflux disease)   . Parkinson disease [facility])     Past Surgical History:  Procedure Laterality Date  . ABDOMINAL HYSTERECTOMY    . CHOLECYSTECTOMY    . DEEP BRAIN STIMULATOR PLACEMENT    . HEMORROIDECTOMY      There were no vitals filed for this visit.      Subjective Assessment - 08/03/16 1643    Subjective Patient was very tired today.   Currently in Pain? No/denies               ADULT SLP TREATMENT - 08/03/16 0001      General Information   Behavior/Cognition Alert;Cooperative;Pleasant mood   HPI 76 year old woman with Parkinson's disease, s/p bilateral DBS, with moderate hypophonia.  Patient had a course of LSVT-LOUD 5 or 6 years ago.     Treatment Provided   Treatment provided Cognitive-Linquistic     Pain Assessment   Pain Assessment No/denies pain     Cognitive-Linquistic Treatment   Treatment focused on Voice   Skilled Treatment Daily Task #1 (Maximum sustained "ah"): Average 6 seconds, 79 dB. Daily Task 2 (Maximum fundamental frequency range): Highs: 15 high pitched "ah" given min-mod cues. Lows: 15 low pitched "ah" given mod-max cues. Daily task #3 (Maximum speech loudness drill of functional phrases): Average 74 dB.  Hierarchal speech loudness drill: single words, 71 dB. Read  sentences, 73 dB.  Phrase/sentence to answer questions, 72 dB, up to 75 dB given cues and models. Homework: assignments given.  Off the cuff remarks: average 69 dB; up to 72 dB given reminders to be loud.       Assessment / Recommendations / Plan   Plan Continue with current plan of care     Progression Toward Goals   Progression toward goals Progressing toward goals          SLP Education - 08/03/16 1643    Education provided Yes   Education Details LSVT-LOUD   Person(s) Educated Patient   Methods Explanation;Demonstration   Comprehension Verbalized understanding;Returned demonstration            SLP Long Term Goals - 08/03/16 1649      SLP LONG TERM GOAL #1   Title The patient will complete Daily Tasks (Maximum duration "ah", High/Lows, and Functional Phrases) at average loudness of 80 dB and with loud, good quality voice.    Time 4   Period Weeks   Status On-going     SLP LONG TERM GOAL #2   Title The patient will complete Hierarchal Speech Loudness reading drills (words/phrases, sentences, and paragraph) at average 75 dB and with loud, good quality voice.     Time 4   Period Weeks   Status On-going  SLP LONG TERM GOAL #3   Title The patient will participate in conversation, maintaining average loudness of 75 dB and loud, good quality voice.   Time 4   Period Weeks   Status On-going     SLP LONG TERM GOAL #4   Title The patient will complete homework daily.   Time 4   Period Weeks   Status On-going          Plan - 08/03/16 1648    Clinical Impression Statement Patient struggles to perceive differences in loudness in her speech. Patient is completing her homework exercises as prescribed by LSVT-LOUD guidelines. The patient is completing daily tasks and hierarchal speech drill tasks with loud, good quality voice given moderate SLP cues. Patient is requiring fewer cues from the clinician in her daily tasks and hierarchical speech drills. Patient still uses a  much quieter voice in conversation and in "off the cuff" remarks compared to her daily tasks. Patient increases her loudness when cued by the clinician. Clinician will continue to encourage increased loudness in all parts of the session.   Speech Therapy Frequency 4x / week   Duration 4 weeks   Treatment/Interventions SLP instruction and feedback;Patient/family education;Other (comment)   Potential to Achieve Goals Good   Potential Considerations Ability to learn/carryover information;Co-morbidities;Cooperation/participation level;Medical prognosis;Pain level;Previous level of function;Severity of impairments;Family/community support   SLP Home Exercise Plan LSVT-LOUD daily homework   Consulted and Agree with Plan of Care Patient      Patient will benefit from skilled therapeutic intervention in order to improve the following deficits and impairments:   Dysphonia    Problem List There are no active problems to display for this patient.   Aline August 76/21/2018, 4:50 PM  [employer] MAIN [facility] SERVICES [address] [place], [place], [address] Phone: [phone]   Fax:  [phone]   Name: Kelsey Owens MRN: [id number] Date of Birth: 04-27-1940

## 2016-08-04 ENCOUNTER — Encounter: Payer: Self-pay | Admitting: Speech Pathology

## 2016-08-04 ENCOUNTER — Ambulatory Visit: Payer: Medicare Other | Admitting: Speech Pathology

## 2016-08-04 DIAGNOSIS — R490 Dysphonia: Secondary | ICD-10-CM

## 2016-08-04 NOTE — Therapy (Signed)
[place] MAIN [facility] SERVICES [address] [place], [place], [address] Phone: [phone]   Fax:  [phone]  Speech Language Pathology Treatment  Patient Details  Name: Kelsey Owens MRN: [id number] Date of Birth: 1941/02/03 Referring Provider: Tempie Hoist  Encounter Date: 08/04/2016      End of Session - 08/04/16 1216    Visit Number 11   Number of Visits 17   Date for SLP Re-Evaluation 08/18/16   SLP Start Time 1100   SLP Stop Time  1200   SLP Time Calculation (min) 60 min   Activity Tolerance Patient tolerated treatment well      Past Medical History:  Diagnosis Date  . Arthritis   . GERD (gastroesophageal reflux disease)   . Parkinson disease [facility])     Past Surgical History:  Procedure Laterality Date  . ABDOMINAL HYSTERECTOMY    . CHOLECYSTECTOMY    . DEEP BRAIN STIMULATOR PLACEMENT    . HEMORROIDECTOMY      There were no vitals filed for this visit.      Subjective Assessment - 08/04/16 1215    Subjective Patient said she was feeling better today.   Currently in Pain? No/denies               ADULT SLP TREATMENT - 76/22/18 0001      General Information   Behavior/Cognition Alert;Cooperative;Pleasant mood   HPI 76 year old woman with Parkinson's disease, s/p bilateral DBS, with moderate hypophonia.  Patient had a course of LSVT-LOUD 5 or 6 years ago.     Treatment Provided   Treatment provided Cognitive-Linquistic     Pain Assessment   Pain Assessment No/denies pain     Cognitive-Linquistic Treatment   Treatment focused on Voice   Skilled Treatment Daily Task #1 (Maximum sustained "ah"): Average 9 seconds, 81 dB. Daily Task 2 (Maximum fundamental frequency range): Highs: 15 high pitched "ah" given min-mod cues. Lows: 15 low pitched "ah" given max cues. Daily task #3 (Maximum speech loudness drill of functional phrases): Average 74 dB.  Hierarchal speech loudness drill: single words, 73 dB.  Read sentences, 75 dB.  Phrase/sentence to answer questions, 73 dB, up to 79 dB given cues and models. Homework: assignments given.  Off the cuff remarks: average 70 dB; up to 83 dB given reminders to be loud.      Assessment / Recommendations / Plan   Plan Continue with current plan of care     Progression Toward Goals   Progression toward goals Progressing toward goals          SLP Education - 08/04/16 1216    Education provided Yes   Education Details LSVT-LOUD   Person(s) Educated Patient   Methods Explanation;Demonstration   Comprehension Verbalized understanding;Returned demonstration            SLP Long Term Goals - 08/03/16 1649      SLP LONG TERM GOAL #1   Title The patient will complete Daily Tasks (Maximum duration "ah", High/Lows, and Functional Phrases) at average loudness of 80 dB and with loud, good quality voice.    Time 4   Period Weeks   Status On-going     SLP LONG TERM GOAL #2   Title The patient will complete Hierarchal Speech Loudness reading drills (words/phrases, sentences, and paragraph) at average 75 dB and with loud, good quality voice.     Time 4   Period Weeks   Status On-going  SLP LONG TERM GOAL #3   Title The patient will participate in conversation, maintaining average loudness of 75 dB and loud, good quality voice.   Time 4   Period Weeks   Status On-going     SLP LONG TERM GOAL #4   Title The patient will complete homework daily.   Time 4   Period Weeks   Status On-going          Plan - 16-Aug-2016 1216    Clinical Impression Statement Patient struggles with producing good vocal quality during low pitched "ah"s. Patient is improving in the duration, loudness, and quality of her daily tasks. Patient still uses a much quieter voice in conversation and in "off the cuff" remarks compared to her daily tasks. Patient increases her loudness when cued by the clinician. Clinician will continue to encourage increased loudness in all  parts of the session.   Speech Therapy Frequency 4x / week   Duration 4 weeks   Treatment/Interventions SLP instruction and feedback;Patient/family education;Other (comment)   Potential to Achieve Goals Good   Potential Considerations Ability to learn/carryover information;Co-morbidities;Cooperation/participation level;Medical prognosis;Pain level;Previous level of function;Severity of impairments;Family/community support   SLP Home Exercise Plan LSVT-LOUD daily homework   Consulted and Agree with Plan of Care Patient      Patient will benefit from skilled therapeutic intervention in order to improve the following deficits and impairments:   Dysphonia      G-Codes - 2016/08/16 0832    Functional Assessment Tool Used LSVT-LOUD protocol, clinical judgment   Functional Limitations Voice   Voice Current Status (G9171) At least 40 percent but less than 60 percent impaired, limited or restricted   Voice Goal Status (G9172) At least 20 percent but less than 40 percent impaired, limited or restricted      Problem List There are no active problems to display for this patient.   Aline August 2016-08-16, 12:17 PM  [place] MAIN [facility] SERVICES [address] [place], [place], [address] Phone: [phone]   Fax:  [phone]   Name: Kelsey Owens MRN: [id number] Date of Birth: 1940/04/23

## 2016-08-07 ENCOUNTER — Ambulatory Visit: Payer: Medicare Other | Admitting: Speech Pathology

## 2016-08-08 ENCOUNTER — Ambulatory Visit: Payer: Medicare Other | Admitting: Speech Pathology

## 2016-08-09 ENCOUNTER — Ambulatory Visit: Payer: Medicare Other | Admitting: Speech Pathology

## 2016-08-09 DIAGNOSIS — R490 Dysphonia: Secondary | ICD-10-CM | POA: Diagnosis not present

## 2016-08-10 ENCOUNTER — Encounter: Payer: Self-pay | Admitting: Speech Pathology

## 2016-08-10 ENCOUNTER — Ambulatory Visit: Payer: Medicare Other | Admitting: Speech Pathology

## 2016-08-10 DIAGNOSIS — R490 Dysphonia: Secondary | ICD-10-CM | POA: Diagnosis not present

## 2016-08-10 NOTE — Therapy (Signed)
[place] MAIN [facility] SERVICES [address] [place], [place], [address] Phone: [phone]   Fax:  [phone]  Speech Language Pathology Treatment  Patient Details  Name: Kelsey Owens MRN: [id number] Date of Birth: [birth year] Referring Provider: Tempie Hoist  Encounter Date: 08/09/2016      End of Session - 08/10/16 0919    Visit Number 12   Number of Visits 17   Date for SLP Re-Evaluation 08/18/16   SLP Start Time 1500   SLP Stop Time  1600   SLP Time Calculation (min) 60 min   Activity Tolerance Patient tolerated treatment well      Past Medical History:  Diagnosis Date  . Arthritis   . GERD (gastroesophageal reflux disease)   . Parkinson disease [facility])     Past Surgical History:  Procedure Laterality Date  . ABDOMINAL HYSTERECTOMY    . CHOLECYSTECTOMY    . DEEP BRAIN STIMULATOR PLACEMENT    . HEMORROIDECTOMY      There were no vitals filed for this visit.      Subjective Assessment - 08/10/16 0918    Subjective Patient said she was giving max effort on her daily tasks.   Currently in Pain? No/denies               ADULT SLP TREATMENT - 08/10/16 0001      General Information   Behavior/Cognition Alert;Cooperative;Pleasant mood   HPI 76 year old woman with Parkinson's disease, s/p bilateral DBS, with moderate hypophonia.  Patient had a course of LSVT-LOUD 5 or 6 years ago.     Treatment Provided   Treatment provided Cognitive-Linquistic     Pain Assessment   Pain Assessment No/denies pain     Cognitive-Linquistic Treatment   Treatment focused on Voice   Skilled Treatment Daily Task #1 (Maximum sustained "ah"): Average 8 seconds, 83 dB. Daily Task 2 (Maximum fundamental frequency range): Highs: 15 high pitched "ah" given min cues. Lows: 15 low pitched "ah" given min-mod cues. Daily task #3 (Maximum speech loudness drill of functional phrases): Average 75 dB.  Hierarchal speech loudness drill: single  words/short phrases, 75 dB. Read sentences, 73 dB.  Conversation, 72 dB, up to 77 dB given cues and models. Homework: assignments given.  Off the cuff remarks: average 70 dB; up to 79 dB given reminders to be loud.     Assessment / Recommendations / Plan   Plan Continue with current plan of care     Progression Toward Goals   Progression toward goals Progressing toward goals          SLP Education - 08/10/16 0919    Education provided Yes   Education Details LSVT-LOUD   Person(s) Educated Patient   Methods Explanation;Demonstration   Comprehension Verbalized understanding;Returned demonstration            SLP Long Term Goals - 08/03/16 1649      SLP LONG TERM GOAL #1   Title The patient will complete Daily Tasks (Maximum duration "ah", High/Lows, and Functional Phrases) at average loudness of 80 dB and with loud, good quality voice.    Time 4   Period Weeks   Status On-going     SLP LONG TERM GOAL #2   Title The patient will complete Hierarchal Speech Loudness reading drills (words/phrases, sentences, and paragraph) at average 75 dB and with loud, good quality voice.     Time 4   Period Weeks   Status On-going  SLP LONG TERM GOAL #3   Title The patient will participate in conversation, maintaining average loudness of 75 dB and loud, good quality voice.   Time 4   Period Weeks   Status On-going     SLP LONG TERM GOAL #4   Title The patient will complete homework daily.   Time 4   Period Weeks   Status On-going          Plan - 08/10/16 0920    Clinical Impression Statement Patient produced better vocal quality during low pitched "ah"s today than she had in previous sessions. Patient is improving in the duration, loudness, and quality of her daily tasks. Patient still uses a much quieter voice in conversation and in "off the cuff" remarks compared to her daily tasks, but is showing the emerging ability to self-monitor and self-correct. Patient increases her  loudness when cued by the clinician. Clinician will continue to encourage increased loudness in all parts of the session.   Speech Therapy Frequency 4x / week   Duration 4 weeks   Treatment/Interventions SLP instruction and feedback;Patient/family education;Other (comment)   Potential to Achieve Goals Good   Potential Considerations Ability to learn/carryover information;Co-morbidities;Cooperation/participation level;Medical prognosis;Pain level;Previous level of function;Severity of impairments;Family/community support   SLP Home Exercise Plan LSVT-LOUD daily homework   Consulted and Agree with Plan of Care Patient      Patient will benefit from skilled therapeutic intervention in order to improve the following deficits and impairments:   Dysphonia    Problem List There are no active problems to display for this patient.   Aline August 08/10/2016, 9:20 AM  [place] MAIN [facility] SERVICES [address] [place], [place], [address] Phone: [phone]   Fax:  [phone]   Name: Kelsey Owens MRN: [id number] Date of Birth: 10/13/1940

## 2016-08-11 ENCOUNTER — Ambulatory Visit: Payer: Medicare Other | Admitting: Speech Pathology

## 2016-08-11 ENCOUNTER — Encounter: Payer: Self-pay | Admitting: Speech Pathology

## 2016-08-11 DIAGNOSIS — R490 Dysphonia: Secondary | ICD-10-CM

## 2016-08-11 NOTE — Therapy (Signed)
[place] MAIN [facility] SERVICES [address] [place], [place], [address] Phone: [phone]   Fax:  [phone]  Speech Language Pathology Treatment  Patient Details  Name: Kelsey Owens MRN: [id number] Date of Birth: 08/31/1940 Referring Provider: Tempie Hoist  Encounter Date: 08/10/2016      End of Session - 08/11/16 1025    Visit Number 13   Number of Visits 17   Date for SLP Re-Evaluation 08/18/16   SLP Start Time 1100   SLP Stop Time  1155   SLP Time Calculation (min) 55 min   Activity Tolerance Patient tolerated treatment well      Past Medical History:  Diagnosis Date  . Arthritis   . GERD (gastroesophageal reflux disease)   . Parkinson disease [facility])     Past Surgical History:  Procedure Laterality Date  . ABDOMINAL HYSTERECTOMY    . CHOLECYSTECTOMY    . DEEP BRAIN STIMULATOR PLACEMENT    . HEMORROIDECTOMY      There were no vitals filed for this visit.      Subjective Assessment - 08/11/16 1024    Subjective Patient said she was giving max effort on her daily tasks.   Currently in Pain? No/denies               ADULT SLP TREATMENT - 08/11/16 0001      General Information   Behavior/Cognition Alert;Cooperative;Pleasant mood   HPI 76 year old woman with Parkinson's disease, s/p bilateral DBS, with moderate hypophonia.  Patient had a course of LSVT-LOUD 5 or 6 years ago.     Treatment Provided   Treatment provided Cognitive-Linquistic     Pain Assessment   Pain Assessment No/denies pain     Cognitive-Linquistic Treatment   Treatment focused on Voice   Skilled Treatment Daily Task #1 (Maximum sustained "ah"): Average 10 seconds, 79 dB. Daily Task 2 (Maximum fundamental frequency range): Highs: 15 high pitched "ah" given mod cues. Lows: 15 low pitched "ah" given mod cues. Daily task #3 (Maximum speech loudness drill of functional phrases): Average 74 dB.  Hierarchal speech loudness drill:  Phrase/sentence to answer questions, 70 dB. Off the cuff remarks: average 68 dB; up to 71 dB given reminders to be loud.  Homework: assignments completed.     Assessment / Recommendations / Plan   Plan Continue with current plan of care     Progression Toward Goals   Progression toward goals Progressing toward goals          SLP Education - 08/11/16 1024    Education provided Yes   Education Details LSVT-LOUD   Person(s) Educated Patient   Methods Explanation   Comprehension Verbalized understanding            SLP Long Term Goals - 08/03/16 1649      SLP LONG TERM GOAL #1   Title The patient will complete Daily Tasks (Maximum duration "ah", High/Lows, and Functional Phrases) at average loudness of 80 dB and with loud, good quality voice.    Time 4   Period Weeks   Status On-going     SLP LONG TERM GOAL #2   Title The patient will complete Hierarchal Speech Loudness reading drills (words/phrases, sentences, and paragraph) at average 75 dB and with loud, good quality voice.     Time 4   Period Weeks   Status On-going     SLP LONG TERM GOAL #3   Title The patient will participate in conversation,  maintaining average loudness of 75 dB and loud, good quality voice.   Time 4   Period Weeks   Status On-going     SLP LONG TERM GOAL #4   Title The patient will complete homework daily.   Time 4   Period Weeks   Status On-going          Plan - 08/11/16 1025    Clinical Impression Statement Patient produced better vocal quality during low pitched "ah"s today than she had in previous sessions. Patient is improving in the duration, loudness, and quality of her daily tasks. Patient still uses a much quieter voice in conversation and in "off the cuff" remarks compared to her daily tasks, but is showing the emerging ability to self-monitor and self-correct. Patient increases her loudness when cued by the clinician. Clinician will continue to encourage increased loudness in all  parts of the session.   Speech Therapy Frequency 4x / week   Duration 4 weeks   Treatment/Interventions SLP instruction and feedback;Patient/family education;Other (comment)  LSVT-LOUD   Potential to Achieve Goals Good   Potential Considerations Ability to learn/carryover information;Co-morbidities;Cooperation/participation level;Medical prognosis;Pain level;Previous level of function;Severity of impairments;Family/community support   SLP Home Exercise Plan LSVT-LOUD daily homework   Consulted and Agree with Plan of Care Patient      Patient will benefit from skilled therapeutic intervention in order to improve the following deficits and impairments:   Dysphonia    Problem List There are no active problems to display for this patient.  Leroy Sea, MS/CCC- SLP  [name] 08/11/2016, 10:26 AM  [place] MAIN [facility] SERVICES [address] [place], [place], [address] Phone: [phone]   Fax:  [phone]   Name: Kelsey Owens MRN: [id number] Date of Birth: 1941-01-23

## 2016-08-11 NOTE — Therapy (Signed)
[place] MAIN [facility] SERVICES [address] [place], [place], [address] Phone: [phone]   Fax:  [phone]  Speech Language Pathology Treatment  Patient Details  Name: Kelsey Owens MRN: [id number] Date of Birth: January 19, 1941 Referring Provider: Tempie Hoist  Encounter Date: 08/11/2016      End of Session - 08/11/16 1559    Visit Number 14   Number of Visits 17   Date for SLP Re-Evaluation 08/18/16   SLP Start Time 1500   SLP Stop Time  1550   SLP Time Calculation (min) 50 min   Activity Tolerance Patient tolerated treatment well      Past Medical History:  Diagnosis Date  . Arthritis   . GERD (gastroesophageal reflux disease)   . Parkinson disease [facility])     Past Surgical History:  Procedure Laterality Date  . ABDOMINAL HYSTERECTOMY    . CHOLECYSTECTOMY    . DEEP BRAIN STIMULATOR PLACEMENT    . HEMORROIDECTOMY      There were no vitals filed for this visit.      Subjective Assessment - 08/11/16 1555    Subjective Patient states that she feels better today   Currently in Pain? No/denies               ADULT SLP TREATMENT - 08/11/16 1554      General Information   Behavior/Cognition Alert;Cooperative;Pleasant mood   HPI 76 year old woman with Parkinson's disease, s/p bilateral DBS, with moderate hypophonia.  Patient had a course of LSVT-LOUD 5 or 6 years ago.     Treatment Provided   Treatment provided Cognitive-Linquistic     Pain Assessment   Pain Assessment No/denies pain     Cognitive-Linquistic Treatment   Treatment focused on Voice   Skilled Treatment Daily Task #1 (Maximum sustained "ah"): Average 6 seconds, 75 dB. Daily Task 2 (Maximum fundamental frequency range): Highs: 15 high pitched "ah" given min-mod cues. Lows: 15 low pitched "ah" given min-mod cues. Daily task #3 (Maximum speech loudness drill of functional phrases): Average 74 dB.  Hierarchal speech loudness drill: Phrase/sentence to  answer questions, 70 dB. Off the cuff remarks: average 68 dB; up to 71 dB given reminders to be loud.  Homework: assignments completed.     Assessment / Recommendations / Plan   Plan Continue with current plan of care     Progression Toward Goals   Progression toward goals Progressing toward goals          SLP Education - 08/11/16 1555    Education provided Yes   Education Details LSVT-LOUD   Person(s) Educated Patient   Methods Explanation   Comprehension Verbalized understanding            SLP Long Term Goals - 08/03/16 1649      SLP LONG TERM GOAL #1   Title The patient will complete Daily Tasks (Maximum duration "ah", High/Lows, and Functional Phrases) at average loudness of 80 dB and with loud, good quality voice.    Time 4   Period Weeks   Status On-going     SLP LONG TERM GOAL #2   Title The patient will complete Hierarchal Speech Loudness reading drills (words/phrases, sentences, and paragraph) at average 75 dB and with loud, good quality voice.     Time 4   Period Weeks   Status On-going     SLP LONG TERM GOAL #3   Title The patient will participate in conversation, maintaining average loudness of  75 dB and loud, good quality voice.   Time 4   Period Weeks   Status On-going     SLP LONG TERM GOAL #4   Title The patient will complete homework daily.   Time 4   Period Weeks   Status On-going          Plan - 08/11/16 1600    Clinical Impression Statement The patient is completing daily tasks and hierarchal speech drill tasks with loud, good quality voice given fewer SLP cues.  She is demonstrating emerging generalization into conversational speech.   Speech Therapy Frequency 4x / week   Duration 4 weeks   Treatment/Interventions SLP instruction and feedback;Patient/family education;Other (comment)  LSVT-LOUD   Potential to Achieve Goals Good   Potential Considerations Ability to learn/carryover information;Co-morbidities;Cooperation/participation  level;Medical prognosis;Pain level;Previous level of function;Severity of impairments;Family/community support   SLP Home Exercise Plan LSVT-LOUD daily homework   Consulted and Agree with Plan of Care Patient      Patient will benefit from skilled therapeutic intervention in order to improve the following deficits and impairments:   Dysphonia    Problem List There are no active problems to display for this patient.  Kelsey Owens, [place], Kelsey Owens 08/11/2016, 4:01 PM  [place] MAIN [facility] [place] SERVICES [address] [place], [place], [address] Phone: [phone]   Fax:  [phone]   Name: Kelsey Owens MRN: [id number] Date of Birth: 1941/01/25

## 2016-08-14 ENCOUNTER — Ambulatory Visit: Payer: Medicare Other | Admitting: Speech Pathology

## 2016-08-17 ENCOUNTER — Ambulatory Visit: Payer: Medicare Other | Attending: Neurology | Admitting: Speech Pathology

## 2016-08-17 ENCOUNTER — Encounter: Payer: Self-pay | Admitting: Speech Pathology

## 2016-08-17 DIAGNOSIS — R490 Dysphonia: Secondary | ICD-10-CM | POA: Insufficient documentation

## 2016-08-17 NOTE — Therapy (Signed)
[place] [facility] [address] [place], [place], [address] Phone: [phone]   Fax:  [phone]  Speech Language Pathology Treatment  Patient Details  Name: Kelsey Owens MRN: [id number] Date of Birth: 09/10/1940 Referring Provider: Tempie Hoist  Encounter Date: 08/17/2016      End of Session - 08/17/16 1515    Visit Number 15   Number of Visits 17   Date for SLP Re-Evaluation 08/18/16   SLP Start Time 1100   SLP Stop Time  1200   SLP Time Calculation (min) 60 min   Activity Tolerance Patient tolerated treatment well      Past Medical History:  Diagnosis Date  . Arthritis   . GERD (gastroesophageal reflux disease)   . Parkinson disease [facility])     Past Surgical History:  Procedure Laterality Date  . ABDOMINAL HYSTERECTOMY    . CHOLECYSTECTOMY    . DEEP BRAIN STIMULATOR PLACEMENT    . HEMORROIDECTOMY      There were no vitals filed for this visit.      Subjective Assessment - 08/17/16 1514    Subjective Patient worked hard throughout the session.   Currently in Pain? No/denies               ADULT SLP TREATMENT - 08/17/16 0001      General Information   Behavior/Cognition Alert;Cooperative;Pleasant mood   HPI 76 year old woman with Parkinson's disease, s/p bilateral DBS, with moderate hypophonia.  Patient had a course of LSVT-LOUD 5 or 6 years ago.     Treatment Provided   Treatment provided Cognitive-Linquistic     Pain Assessment   Pain Assessment No/denies pain     Cognitive-Linquistic Treatment   Treatment focused on Voice   Skilled Treatment Daily Task #1 (Maximum sustained "ah"): Average 7 seconds, 80 dB. Daily Task 2 (Maximum fundamental frequency range): Highs: 15 high pitched "ah" given minimal cues. Lows: 15 low pitched "ah" given minimal cues. Daily task #3 (Maximum speech loudness drill of functional phrases): Average 76 dB.  Hierarchal speech loudness drill: Word level: 72 dB,  Phrase/sentence to answer questions: 73 dB. Off the cuff remarks: average 70 dB; up to 77 dB given reminders to be loud.  Homework: assignments completed.     Assessment / Recommendations / Plan   Plan Continue with current plan of care     Progression Toward Goals   Progression toward goals Progressing toward goals          SLP Education - 08/17/16 1515    Education provided Yes   Education Details LSVT-LOUD   Person(s) Educated Patient   Methods Explanation;Demonstration   Comprehension Verbalized understanding;Returned demonstration            SLP Long Term Goals - 08/03/16 1649      SLP LONG TERM GOAL #1   Title The patient will complete Daily Tasks (Maximum duration "ah", High/Lows, and Functional Phrases) at average loudness of 80 dB and with loud, good quality voice.    Time 4   Period Weeks   Status On-going     SLP LONG TERM GOAL #2   Title The patient will complete Hierarchal Speech Loudness reading drills (words/phrases, sentences, and paragraph) at average 75 dB and with loud, good quality voice.     Time 4   Period Weeks   Status On-going     SLP LONG TERM GOAL #3   Title The patient will participate in conversation,  maintaining average loudness of 75 dB and loud, good quality voice.   Time 4   Period Weeks   Status On-going     SLP LONG TERM GOAL #4   Title The patient will complete homework daily.   Time 4   Period Weeks   Status On-going          Plan - 08/17/16 1515    Clinical Impression Statement The patient is completing daily tasks and hierarchal speech drill tasks with loud, good quality voice given fewer SLP cues.  Patient is demonstrating more consistent loudness in conversation.   Speech Therapy Frequency 4x / week   Duration 4 weeks   Treatment/Interventions SLP instruction and feedback;Patient/family education;Other (comment)   Potential to Achieve Goals Good   Potential Considerations Ability to learn/carryover  information;Co-morbidities;Cooperation/participation level;Medical prognosis;Pain level;Previous level of function;Severity of impairments;Family/community support   SLP Home Exercise Plan LSVT-LOUD daily homework   Consulted and Agree with Plan of Care Patient      Patient will benefit from skilled therapeutic intervention in order to improve the following deficits and impairments:   Dysphonia    Problem List There are no active problems to display for this patient.   Aline August 08/17/2016, 3:17 PM  [place] MAIN [facility] SERVICES [address] [place], [place], [address] Phone: [phone]   Fax:  [phone]   Name: Marisue Canion MRN: [id number] Date of Birth: [birth year]

## 2016-08-18 ENCOUNTER — Encounter: Payer: Self-pay | Admitting: Speech Pathology

## 2016-08-18 ENCOUNTER — Ambulatory Visit: Payer: Medicare Other | Admitting: Speech Pathology

## 2016-08-18 DIAGNOSIS — R490 Dysphonia: Secondary | ICD-10-CM | POA: Diagnosis not present

## 2016-08-18 NOTE — Therapy (Signed)
[place] MAIN [facility] SERVICES [address] [place], [place], [address] Phone: [phone]   Fax:  [phone]  Speech Language Pathology Discharge Summary  Patient Details  Name: Kelsey Owens MRN: [id number] Date of Birth: 10-10-1940 Referring Provider: Tempie Hoist  Encounter Date: 08/18/2016      End of Session - 08/18/16 1515    Visit Number 16   Number of Visits 17   Date for SLP Re-Evaluation 08/18/16   SLP Start Time 2355   SLP Stop Time  1500   SLP Time Calculation (min) 55 min   Activity Tolerance Patient tolerated treatment well      Past Medical History:  Diagnosis Date  . Arthritis   . GERD (gastroesophageal reflux disease)   . Parkinson disease [facility])     Past Surgical History:  Procedure Laterality Date  . ABDOMINAL HYSTERECTOMY    . CHOLECYSTECTOMY    . DEEP BRAIN STIMULATOR PLACEMENT    . HEMORROIDECTOMY      There were no vitals filed for this visit.      Subjective Assessment - 08/18/16 1515    Subjective Patient is dealing with a lot of nasal congestion and had a hard time being loud.    Currently in Pain? No/denies               ADULT SLP TREATMENT - 08/18/16 0001      General Information   Behavior/Cognition Alert;Cooperative;Pleasant mood   HPI 76 year old woman with Parkinson's disease, s/p bilateral DBS, with moderate hypophonia.  Patient had a course of LSVT-LOUD 5 or 6 years ago.     Treatment Provided   Treatment provided Cognitive-Linquistic     Pain Assessment   Pain Assessment No/denies pain     Cognitive-Linquistic Treatment   Treatment focused on Voice   Skilled Treatment Daily Task #1 (Maximum sustained "ah"): Average 8 seconds, 80 dB. Daily Task 2 (Maximum fundamental frequency range): Highs: 15 high pitched "ah" given minimal cues. Lows: 15 low pitched "ah" given minimal cues. Daily task #3 (Maximum speech loudness drill of functional phrases): Average 74 dB.   Hierarchal speech loudness drill: Phrase/sentence to answer questions: 73 dB, paragraph reading: 72 dB, conversation: 71 dB. Off the cuff remarks: average 73 dB; up to 77 dB given reminders to be loud.  Homework: assignments completed.     Assessment / Recommendations / Plan   Plan Discharge SLP treatment due to completion of LSVT-LOUD program     Progression Toward Goals   Progression toward goals Goals met, education completed, patient discharged from [place] Education - 08/18/16 1515    Education provided Yes   Education Details LSVT-LOUD   Person(s) Educated Patient   Methods Explanation;Demonstration   Comprehension Verbalized understanding;Returned demonstration            SLP Long Term Goals - 08/18/16 1517      SLP LONG TERM GOAL #1   Title The patient will complete Daily Tasks (Maximum duration "ah", High/Lows, and Functional Phrases) at average loudness of 80 dB and with loud, good quality voice.    Time 4   Period Weeks   Status Partially Met     SLP LONG TERM GOAL #2   Title The patient will complete Hierarchal Speech Loudness reading drills (words/phrases, sentences, and paragraph) at average 75 dB and with loud, good quality voice.     Time 4  Period Weeks   Status Partially Met     SLP LONG TERM GOAL #3   Title The patient will participate in conversation, maintaining average loudness of 75 dB and loud, good quality voice.   Time 4   Period Weeks   Status Not Met     SLP LONG TERM GOAL #4   Title The patient will complete homework daily.   Time 4   Period Weeks   Status Achieved          Plan - 08/18/16 1516    Clinical Impression Statement The patient has a cold and required more cues to be loud today. The patient has received four weeks of LSVT-LOUD therapy. She has completed her three daily tasks with good vocal quality and loudness. Patient has increased her loudness and her vocal quality at the word-, phrase-, sentence-, and  conversation-level given clinician cues for loudness. The patient has demonstrated the ability to be loud enough for others to clearly hear her when she is given reminders. Continuing to practice her daily exercises will help her to maintain the progress she has made.   Speech Therapy Frequency Other (comment)   Duration Other (comment)   Treatment/Interventions SLP instruction and feedback;Patient/family education;Other (comment)   Potential to Achieve Goals Good   Potential Considerations Ability to learn/carryover information;Co-morbidities;Cooperation/participation level;Medical prognosis;Pain level;Previous level of function;Severity of impairments;Family/community support   SLP Home Exercise Plan LSVT-LOUD daily homework   Consulted and Agree with Plan of Care Patient      Patient will benefit from skilled therapeutic intervention in order to improve the following deficits and impairments:   Dysphonia    Problem List There are no active problems to display for this patient.   Aline August 08/18/2016, 3:18 PM  [place] MAIN [facility] SERVICES [address] [place], [place], [address] Phone: [phone]   Fax:  [phone]   Name: Kelsey Owens MRN: [id number] Date of Birth: 18-Mar-1940

## 2016-11-01 DIAGNOSIS — M47816 Spondylosis without myelopathy or radiculopathy, lumbar region: Secondary | ICD-10-CM | POA: Insufficient documentation

## 2016-12-13 DIAGNOSIS — S8010XA Contusion of unspecified lower leg, initial encounter: Secondary | ICD-10-CM | POA: Insufficient documentation

## 2017-01-02 ENCOUNTER — Other Ambulatory Visit (INDEPENDENT_AMBULATORY_CARE_PROVIDER_SITE_OTHER): Payer: Self-pay

## 2017-01-02 ENCOUNTER — Ambulatory Visit (INDEPENDENT_AMBULATORY_CARE_PROVIDER_SITE_OTHER): Payer: Medicare Other | Admitting: Vascular Surgery

## 2017-01-02 ENCOUNTER — Encounter (INDEPENDENT_AMBULATORY_CARE_PROVIDER_SITE_OTHER): Payer: Self-pay | Admitting: Vascular Surgery

## 2017-01-02 ENCOUNTER — Encounter (INDEPENDENT_AMBULATORY_CARE_PROVIDER_SITE_OTHER): Payer: Self-pay

## 2017-01-02 ENCOUNTER — Other Ambulatory Visit (INDEPENDENT_AMBULATORY_CARE_PROVIDER_SITE_OTHER): Payer: Medicare Other

## 2017-01-02 VITALS — BP 142/84 | HR 92 | Resp 16 | Ht 64.0 in | Wt 146.0 lb

## 2017-01-02 DIAGNOSIS — M79604 Pain in right leg: Secondary | ICD-10-CM

## 2017-01-02 DIAGNOSIS — M7989 Other specified soft tissue disorders: Secondary | ICD-10-CM | POA: Diagnosis not present

## 2017-01-02 DIAGNOSIS — G20 Parkinson's disease: Secondary | ICD-10-CM | POA: Diagnosis not present

## 2017-01-02 DIAGNOSIS — M79606 Pain in leg, unspecified: Secondary | ICD-10-CM | POA: Insufficient documentation

## 2017-01-02 DIAGNOSIS — G20A1 Parkinson's disease without dyskinesia, without mention of fluctuations: Secondary | ICD-10-CM | POA: Insufficient documentation

## 2017-01-02 NOTE — Progress Notes (Addendum)
Subjective:    Patient ID: Kelsey Owens, female    DOB: June 08, 1940, 76 y.o.   MRN: [id number] Chief Complaint  Patient presents with  . New Patient (Initial Visit)    bil leg pain and weakness   Presents as a new patient referred by Dr. Nehemiah Massed for evaluation of right lower extremity pain and swelling.  The patient endorses a history of frequent "falls" due to her Parkinson's.  The patient uses a walker for ambulation.  The patient notes an acute swelling to her right lower extremity specifically in the right calf which started approximately 3-4 weeks ago after a fall.  The patient states her swelling has slowly improved however she does still experience some discomfort especially with ambulation.  The patient notes she bruises "easily".  The patient does not take any anticoagulation.  The patient denies any shortness of breath or chest pain.  Patient denies any rest pain or ulceration to her right lower extremity.  The patient denies any fever, nausea or vomiting.   Review of Systems  Constitutional: Negative.   HENT: Negative.   Eyes: Negative.   Respiratory: Negative.   Cardiovascular: Positive for leg swelling.       Right calf pain  Gastrointestinal: Negative.   Endocrine: Negative.   Genitourinary: Negative.   Musculoskeletal: Negative.   Skin:       Bruising to the right calf  Allergic/Immunologic: Negative.   Neurological: Negative.   Hematological: Negative.   Psychiatric/Behavioral: Negative.       Objective:   Physical Exam  Constitutional: She is oriented to person, place, and time. She appears well-developed and well-nourished. No distress.  HENT:  Head: Normocephalic and atraumatic.  Eyes: Conjunctivae are normal. Pupils are equal, round, and reactive to light.  Neck: Normal range of motion.  Cardiovascular: Normal rate, regular rhythm, normal heart sounds and intact distal pulses.  Pulses:      Radial pulses are 2+ on the right side, and 2+ on the left  side.  Right lower extremity: Mild to moderate nonpitting edema noted.  Possible hematoma to the front of the shin.  Ecchymosis noted along the lateral aspect of the shin.  Skin is intact.  There is no pain to palpation.  There is no pain with dorsiflexion.  Pulmonary/Chest: Effort normal and breath sounds normal.  Neurological: She is alert and oriented to person, place, and time.  Skin: She is not diaphoretic.  Psychiatric: She has a normal mood and affect. Her behavior is normal. Judgment and thought content normal.  Vitals reviewed.  BP (!) 142/84 (BP Location: Right Arm)   Pulse 92   Resp 16   Ht 5' 4"  (1.626 m)   Wt 146 lb (66.2 kg)   BMI 25.06 kg/m   Past Medical History:  Diagnosis Date  . Arthritis   . GERD (gastroesophageal reflux disease)   . Parkinson disease [facility])    Social History   Socioeconomic History  . Marital status: Married    Spouse name: Not on file  . Number of children: Not on file  . Years of education: Not on file  . Highest education level: Not on file  Social Needs  . Financial resource strain: Not on file  . Food insecurity - worry: Not on file  . Food insecurity - inability: Not on file  . Transportation needs - medical: Not on file  . Transportation needs - non-medical: Not on file  Occupational History  . Not on file  Tobacco Use  . Smoking status: Never Smoker  . Smokeless tobacco: Never Used  Substance and Sexual Activity  . Alcohol use: Yes    Comment: occasional  . Drug use: No  . Sexual activity: Not on file  Other Topics Concern  . Not on file  Social History Narrative  . Not on file   Past Surgical History:  Procedure Laterality Date  . ABDOMINAL HYSTERECTOMY    . CHOLECYSTECTOMY    . DEEP BRAIN STIMULATOR PLACEMENT    . HEMORROIDECTOMY     Family History  Problem Relation Age of Onset  . Breast cancer Maternal Aunt   . Breast cancer Paternal Grandmother    Allergies  Allergen Reactions  . Levaquin [Levofloxacin  In D5w]   . Penicillins   . Thorazine [Chlorpromazine] Other (See Comments)  . Ceftin [Cefuroxime] Rash      Assessment & Plan:  Presents as a new patient referred by Dr. Nehemiah Massed for evaluation of right lower extremity pain and swelling.  The patient endorses a history of frequent "falls" due to her Parkinson's.  The patient uses a walker for ambulation.  The patient notes an acute swelling to her right lower extremity specifically in the right calf which started approximately 3-4 weeks ago after a fall.  The patient states her swelling has slowly improved however she does still experience some discomfort especially with ambulation.  The patient notes she bruises "easily".  The patient does not take any anticoagulation.  The patient denies any shortness of breath or chest pain.  Patient denies any rest pain or ulceration to her right lower extremity.  The patient denies any fever, nausea or vomiting.  1. Pain and swelling of right lower extremity - New Patient presents with right lower extremity edema and pain after a fall. Patient with frequent falls approximately 1 a day.  Patient has Parkinson's and uses a walker to ambulate. Patient notes the swelling has improved however she still is experiencing some right calf discomfort especially with ambulation. Patient with possible hematoma to the front of her shin. No cellulitis on examination. I would like to rule the patient out for right lower extremity DVT.  There is an appointment at 4:00 PM this afternoon.  The patient will return at this time and undergo the study.  - VAS [place] LOWER EXTREMITY VENOUS (DVT); Future  2. Parkinson's disease ([place]) - Stable Followed by neurology  Addendum: Patient underwent a right lower extremity venous duplex exam which was negative for deep vein and superficial vein thrombus.  The patient was encouraged to elevate her lower extremity and apply warm compresses to the hematoma like structure on the front of her  shin.  There is no signs of cellulitis or infection so antibiotics were not prescribed however the patient is to call the office if she should experience any erythema to the lower extremity, fever nausea or vomiting.  The patient expresses her understanding.  Current Outpatient Medications on File Prior to Visit  Medication Sig Dispense Refill  . medroxyPROGESTERone (PROVERA) 2.5 MG tablet medroxyprogesterone acetate 2.5 mgtabs    . ondansetron (ZOFRAN ODT) 4 MG disintegrating tablet Take 1 tablet (4 mg total) by mouth every 8 (eight) hours as needed for nausea or vomiting. 20 tablet 0  . ranitidine (ZANTAC) 300 MG tablet ranitidine hcl 300 mg tabs    . simethicone (GAS-X) 80 MG chewable tablet Chew 1 tablet (80 mg total) by mouth every 6 (six) hours as needed for flatulence. 12 tablet  0  . Vitamin D, Ergocalciferol, (DRISDOL) 50000 units CAPS capsule vitamin d 50000 unit caps    . HYDROcodone-acetaminophen (NORCO) 5-325 MG per tablet Take 1 tablet by mouth every 6 (six) hours as needed for moderate pain or severe pain. (Patient not taking: Reported on 01/02/2017) 12 tablet 0   No current facility-administered medications on file prior to visit.     There are no Patient Instructions on file for this visit. No Follow-up on file.   Ignazio Kincaid A Lamount Bankson, PA-C

## 2017-01-03 ENCOUNTER — Encounter (INDEPENDENT_AMBULATORY_CARE_PROVIDER_SITE_OTHER): Payer: Self-pay | Admitting: Vascular Surgery

## 2017-01-14 ENCOUNTER — Other Ambulatory Visit: Payer: Self-pay

## 2017-01-14 ENCOUNTER — Emergency Department: Payer: Medicare Other

## 2017-01-14 ENCOUNTER — Encounter: Payer: Self-pay | Admitting: Emergency Medicine

## 2017-01-14 ENCOUNTER — Emergency Department
Admission: EM | Admit: 2017-01-14 | Discharge: 2017-01-14 | Disposition: A | Discharge status: Home / Self Care | Payer: Medicare Other | Attending: Emergency Medicine | Admitting: Emergency Medicine

## 2017-01-14 DIAGNOSIS — Y999 Unspecified external cause status: Secondary | ICD-10-CM | POA: Diagnosis not present

## 2017-01-14 DIAGNOSIS — Z79899 Other long term (current) drug therapy: Secondary | ICD-10-CM | POA: Diagnosis not present

## 2017-01-14 DIAGNOSIS — S59912A Unspecified injury of left forearm, initial encounter: Secondary | ICD-10-CM | POA: Diagnosis present

## 2017-01-14 DIAGNOSIS — Y939 Activity, unspecified: Secondary | ICD-10-CM | POA: Diagnosis not present

## 2017-01-14 DIAGNOSIS — W010XXA Fall on same level from slipping, tripping and stumbling without subsequent striking against object, initial encounter: Secondary | ICD-10-CM | POA: Insufficient documentation

## 2017-01-14 DIAGNOSIS — G20 Parkinson's disease: Secondary | ICD-10-CM | POA: Insufficient documentation

## 2017-01-14 DIAGNOSIS — S52532A Colles' fracture of left radius, initial encounter for closed fracture: Secondary | ICD-10-CM | POA: Insufficient documentation

## 2017-01-14 DIAGNOSIS — Y929 Unspecified place or not applicable: Secondary | ICD-10-CM | POA: Insufficient documentation

## 2017-01-14 MED ORDER — OXYCODONE HCL 5 MG PO TABS
5.0000 mg | ORAL_TABLET | Freq: Once | ORAL | Status: AC
  Administered 2017-01-14: 5 mg via ORAL
  Filled 2017-01-14: qty 1

## 2017-01-14 NOTE — ED Triage Notes (Signed)
Pt presents to ED s/p fall. Pt states she has a hx of Parkinson's and she tripped earlier today. Pt c/o L wrist pain at this time. Pt presents with wrist wrapped and ice applied.

## 2017-01-14 NOTE — Discharge Instructions (Signed)
Please call Dr. Ammie Ferrier office in the morning to schedule an appointment.

## 2017-01-14 NOTE — ED Provider Notes (Signed)
[facility] Emergency Department Provider Note ____________________________________________  Time seen: Approximately 1:17 PM  I have reviewed the triage vital signs and the nursing notes.   HISTORY  Chief Complaint Fall and Wrist Pain    HPI Kelsey Owens is a 76 y.o. female who presents to the emergency department for evaluation after a mechanical, non-syncopal fall where she tripped and landed with her left hand outstretched.  She has pain and bruising in left wrist.  She denies striking her head or having any loss of consciousness.  She denies pain in the elbow, shoulder, or neck.  She has not taken any medications since the fall.  She has applied ice and wrapped the wrist with some relief. Past Medical History:  Diagnosis Date  . Arthritis   . GERD (gastroesophageal reflux disease)   . Parkinson disease [facility])     Patient Active Problem List   Diagnosis Date Noted  . Pain and swelling of right lower extremity 01/02/2017  . Parkinson's disease ([place]) 01/02/2017    Past Surgical History:  Procedure Laterality Date  . ABDOMINAL HYSTERECTOMY    . CHOLECYSTECTOMY    . DEEP BRAIN STIMULATOR PLACEMENT    . HEMORROIDECTOMY      Prior to Admission medications   Medication Sig Start Date End Date Taking? Authorizing Provider  almotriptan (AXERT) 12.5 MG tablet Take by mouth.    [provider]  baclofen (LIORESAL) 10 MG tablet baclofen 10 mg tablet    [provider]  Biotin 2.5 MG TABS Take by mouth.    [provider]  carbidopa-levodopa (SINEMET IR) 25-100 MG tablet 0.5 tablets 6 (six) times daily.    [provider]  clonazePAM (KLONOPIN) 0.5 MG tablet Take by mouth. 12/14/16 06/12/17  [provider]  HYDROcodone-acetaminophen (NORCO) 5-325 MG per tablet Take 1 tablet by mouth every 6 (six) hours as needed for moderate pain or severe pain. Patient not taking: Reported on 01/02/2017 06/26/14   Ruffian, III  Luanna Cole, PA-C  Lifitegrast (XIIDRA) 5 % SOLN xiidra 5 % soln    [provider]  medroxyPROGESTERone (PROVERA) 2.5 MG tablet medroxyprogesterone acetate 2.5 mgtabs    [provider]  meloxicam (MOBIC) 15 MG tablet meloxicam 15 mg tabs    [provider]  Mesalamine (DELZICOL) 400 MG CPDR DR capsule 2 (two) times daily.    [provider]  mirabegron ER (MYRBETRIQ) 25 MG TB24 tablet myrbetriq 25 mg tb24    [provider]  ondansetron (ZOFRAN ODT) 4 MG disintegrating tablet Take 1 tablet (4 mg total) by mouth every 8 (eight) hours as needed for nausea or vomiting. 01/21/15   Harvest Dark, MD  ranitidine (ZANTAC) 300 MG tablet ranitidine hcl 300 mg tabs    [provider]  rOPINIRole (REQUIP) 1 MG tablet Take by mouth. 06/19/16   [provider]  selegiline (ELDEPRYL) 5 MG capsule selegiline 5 mg capsule 04/05/16   [provider]  sertraline (ZOLOFT) 25 MG tablet sertraline 25 mg tablet 02/04/16   [provider]  simethicone (GAS-X) 80 MG chewable tablet Chew 1 tablet (80 mg total) by mouth every 6 (six) hours as needed for flatulence. 07/06/16 07/06/17  Merlyn Lot, MD  traMADol (ULTRAM) 50 MG tablet tramadol 50 mg tablet    [provider]  Vitamin D, Ergocalciferol, (DRISDOL) 50000 units CAPS capsule vitamin d 50000 unit caps    [provider]    Allergies Levaquin [levofloxacin in d5w]; Penicillins;  Thorazine [chlorpromazine]; and Ceftin [cefuroxime]  Family History  Problem Relation Age of Onset  . Breast cancer Maternal Aunt   . Breast cancer Paternal Grandmother     Social History Social History   Tobacco Use  . Smoking status: Never Smoker  . Smokeless tobacco: Never Used  Substance Use Topics  . Alcohol use: Yes    Comment: occasional  . Drug use: No    Review of Systems Constitutional: Negative for recent illness. Cardiovascular: Negative for chest  pain Respiratory: Negative for shortness of breath Musculoskeletal: Positive for left wrist pain Skin: Positive for ecchymosis over the left wrist Neurological: Negative for paresthesias, change in cognition, or loss of consciousness.  ____________________________________________   PHYSICAL EXAM:  VITAL SIGNS: ED Triage Vitals  Enc Vitals Group     BP --      Pulse Rate 01/14/17 1255 (!) 101     Resp 01/14/17 1255 16     Temp 01/14/17 1255 (!) 97.4 F (36.3 C)     Temp Source 01/14/17 1255 Oral     SpO2 01/14/17 1255 100 %     Weight 01/14/17 1256 147 lb (66.7 kg)     Height 01/14/17 1256 5' 4"  (1.626 m)     Head Circumference --      Peak Flow --      Pain Score 01/14/17 1254 7     Pain Loc --      Pain Edu? --      Excl. in [place]? --     Constitutional: Alert and oriented. Well appearing and in no acute distress. Eyes: Conjunctivae are clear without discharge or drainage Head: Atraumatic Neck: Supple Respiratory: Respirations even and unlabored. Musculoskeletal: Deformity over the radial aspect of the left wrist observed.  Radial pulses easily palpable.  Patient is able to flex and extend fingers. Neurologic: Radial, ulnar, and medial nerves of the left wrist are intact. Skin: No open wounds noted over the left wrist.  Early ecchymosis is present. Psychiatric: Awake, alert, oriented.  Affect and behavior are appropriate.  ____________________________________________   LABS (all labs ordered are listed, but only abnormal results are displayed)  Labs Reviewed - No data to display ____________________________________________  RADIOLOGY  With angulated fracture through the distal left radial metaphysis with posterior fragments no visible ulnar abnormality per radiology.  I, Sherrie George, personally viewed and evaluated these images (plain radiographs) as part of my medical decision making, as well as reviewing the written report by the  radiologist.  ___________________________________________   PROCEDURES  .Splint Application Date/Time: 32/10/5186 1:58 PM Performed by: Victorino Dike, FNP Authorized by: Victorino Dike, FNP   Consent:    Consent obtained:  Verbal   Consent given by:  Patient   Risks discussed:  Pain Pre-procedure details:    Sensation:  Normal Procedure details:    Laterality:  Left   Location:  Wrist   Wrist:  L wrist   Splint type:  Volar short arm   Supplies:  Ortho-Glass, cotton padding and elastic bandage Post-procedure details:    Pain:  Improved   Sensation:  Normal   Patient tolerance of procedure:  Tolerated well, no immediate complications    ____________________________________________   INITIAL IMPRESSION / ASSESSMENT AND PLAN / ED COURSE  Kelsey Owens is a 76 y.o. female who presents to the emergency department for evaluation after sustaining a mechanical, non-syncopal fall earlier today.  X-ray reveals an angulated distal radius fracture.  She was placed in an  OCL splint and is to schedule a follow-up appointment with Dr. Sabra Heck.  Dr. Sabra Heck has been paged so that he is aware that she will need to be seen within the next couple of days.  Patient states that she has oxycodone at home and will take it as prescribed, if needed.  Patient and husband were both made aware she should return to the ER for symptoms of concern if unable to see the PCP or orthopedic specialist.   Medications  oxyCODONE (Oxy IR/ROXICODONE) immediate release tablet 5 mg (5 mg Oral Given 01/14/17 1344)    Pertinent labs & imaging results that were available during my care of the patient were reviewed by me and considered in my medical decision making (see chart for details).  _________________________________________   FINAL CLINICAL IMPRESSION(S) / ED DIAGNOSES  Final diagnoses:  Closed Colles' fracture of left radius, initial encounter    ED Discharge Orders    None       If  controlled substance prescribed during this visit, 12 month history viewed on the [place] prior to issuing an initial prescription for Schedule II or III opiod.    Victorino Dike, FNP 01/14/17 1409    Delman Kitten, MD 01/14/17 [phone]

## 2017-07-03 ENCOUNTER — Encounter: Payer: Medicare Other | Admitting: Occupational Therapy

## 2017-07-06 ENCOUNTER — Encounter: Payer: Medicare Other | Admitting: Occupational Therapy

## 2017-07-10 ENCOUNTER — Encounter: Payer: Medicare Other | Admitting: Occupational Therapy

## 2017-07-16 ENCOUNTER — Other Ambulatory Visit: Payer: Self-pay

## 2017-07-16 ENCOUNTER — Encounter: Payer: Self-pay | Admitting: Physical Therapy

## 2017-07-16 ENCOUNTER — Ambulatory Visit: Payer: Medicare Other | Attending: Neurology | Admitting: Physical Therapy

## 2017-07-16 DIAGNOSIS — G20 Parkinson's disease: Secondary | ICD-10-CM | POA: Diagnosis present

## 2017-07-16 DIAGNOSIS — M6281 Muscle weakness (generalized): Secondary | ICD-10-CM | POA: Diagnosis present

## 2017-07-16 DIAGNOSIS — M7989 Other specified soft tissue disorders: Secondary | ICD-10-CM | POA: Diagnosis present

## 2017-07-16 DIAGNOSIS — R278 Other lack of coordination: Secondary | ICD-10-CM | POA: Insufficient documentation

## 2017-07-16 DIAGNOSIS — M79604 Pain in right leg: Secondary | ICD-10-CM | POA: Insufficient documentation

## 2017-07-16 DIAGNOSIS — R262 Difficulty in walking, not elsewhere classified: Secondary | ICD-10-CM | POA: Insufficient documentation

## 2017-07-16 DIAGNOSIS — R490 Dysphonia: Secondary | ICD-10-CM | POA: Insufficient documentation

## 2017-07-16 NOTE — Therapy (Signed)
[place] MAIN [facility] SERVICES [address] [place], [place], [address] Phone: [phone]   Fax:  [phone]  Physical Therapy Evaluation  Patient Details  Name: Kelsey Owens MRN: [id number] Date of Birth: 1940/11/27 Referring Provider:   Tempie Hoist    Encounter Date: 07/16/2017  PT End of Session - 07/16/17 1311    Visit Number  1    Number of Visits  17    Date for PT Re-Evaluation  09/10/17    PT Start Time  0100    PT Stop Time  0200    PT Time Calculation (min)  60 min    Equipment Utilized During Treatment  Gait belt    Activity Tolerance  Patient tolerated treatment well    Behavior During Therapy  [facility] for tasks assessed/performed       Past Medical History:  Diagnosis Date  . Arthritis   . GERD (gastroesophageal reflux disease)   . Parkinson disease [facility] )     Past Surgical History:  Procedure Laterality Date  . ABDOMINAL HYSTERECTOMY    . CHOLECYSTECTOMY    . DEEP BRAIN STIMULATOR PLACEMENT    . HEMORROIDECTOMY      There were no vitals filed for this visit.   Subjective Assessment - 07/16/17 1307    Subjective  Patient has pain in her right shoulder and right hip. She reports that she is not able to get up and down , get in and out of the car, walk and stand.     Pertinent History  Patient has parkinsons disease for the last 20 years. Her mobility has been getting worse and her MD recommended getting PT for strengthening.     Patient Stated Goals  Patient wants to be able to walk better and not fall as often    Currently in Pain?  Yes    Pain Score  4     Pain Location  Hip    Pain Orientation  Right    Pain Descriptors / Indicators  Aching    Pain Type  Chronic pain    Pain Frequency  Intermittent    Aggravating Factors   standing    Pain Relieving Factors  sitting    Effect of Pain on Daily Activities  unable to do activities    Multiple Pain Sites  -- R shoulder pain 5/10         [facility] PT  Assessment - 07/16/17 1312      Assessment   Medical Diagnosis  parkinsons disease    Referring Provider    SCOTT, Kalman Shan L     Onset Date/Surgical Date  06/19/15    Hand Dominance  Right      Precautions   Precautions  Fall      Restrictions   Weight Bearing Restrictions  No      Balance Screen   Has the patient fallen in the past 6 months  Yes    How many times?  12    Has the patient had a decrease in activity level because of a fear of falling?   Yes    Is the patient reluctant to leave their home because of a fear of falling?   No      Home Social worker  Private residence    Living Arrangements  Spouse/significant other    Available Help at Discharge  Family    Type of [place]  Access  Stairs to enter    Entrance Stairs-Number of Steps  1    Entrance Stairs-Rails  Right    Home Layout  Two level    Alternate Level Stairs-Number of Steps  -- she does not need to go Restaurant manager, fast food chair;Other (comment)      Prior Function   Level of Independence  Needs assistance with ADLs;Needs assistance with homemaking;Needs assistance with transfers            PROM/AROM: WFL  STRENGTH:  Graded on a 0-5 scale Muscle Group Left Right                          Hip Flex 3+/5 3+/5  Hip Abd 3/5 3/5  Hip Add 2/5 2/5  Hip Ext NT  NT  Hip IR/ER NT NT  Knee Flex 3/5 3/5  Knee Ext 3/5 3/5  Ankle DF 3/5 3/5  Ankle PF 3/5 3/5   SENSATION: WNL   FUNCTIONAL MOBILITY: slow mobility with rolling left and right, unable to get into prone  Transfers sit to stand with RW and max assist with use of LE on back of the mat for stabilization. She has decreased motor planning and freezing episodes with BLE  BALANCE: Static Standing Balance  Normal Able to maintain standing balance against maximal resistance   Good Able to maintain standing balance against moderate resistance   Good-/Fair+ Able to maintain standing balance  against minimal resistance   Fair Able to stand unsupported without UE support and without LOB for 1-2 min   Fair- Requires Min A and UE support to maintain standing without loss of balance   Poor+ Requires mod A and UE support to maintain standing without loss of balance   Poor Requires max A and UE support to maintain standing balance without loss x   Dynamic standing : Patient has no dynamic standing balance and is not able to stand without max assist support.  Static Sitting Balance  Normal Able to maintain balance against maximal resistance   Good Able to maintain balance against moderate resistance x  Good-/Fair+ Accepts minimal resistance   Fair Able to sit unsupported without balance loss and without UE support   Poor+ Able to maintain with Minimal assistance from individual or chair   Poor Unable to maintain balance-requires mod/max support from individual or chair    Dynamic Sitting Balance  Normal Able to sit unsupported and weight shift across midline maximally   Good Able to sit unsupported and weight shift across midline moderately x  Good-/Fair+ Able to sit unsupported and weight shift across midline minimally   Fair Minimal weight shifting ipsilateral/front, difficulty crossing midline   Fair- Reach to ipsilateral side and unable to weight shift   Poor + Able to sit unsupported with min A and reach to ipsilateral side, unable to weight shift   Poor Able to sit unsupported with mod A and reach ipsilateral/front-can't cross midline     GAIT: Patient was able to stand with RW for 10 seconds with SBA    Treatment:  Mobility training wc to mat and mat to wc with max assist and RW Reviewed HEP LAQ and hip flex          Objective measurements completed on examination: See above findings.              PT Education - 07/16/17 1310    Education Details  plan of care    Person(s) Educated  Patient    Methods  Explanation    Comprehension  Verbalized  understanding                    Patient will benefit from skilled therapeutic intervention in order to improve the following deficits and impairments:     Visit Diagnosis: Difficulty in walking, not elsewhere classified  Muscle weakness (generalized)     Problem List Patient Active Problem List   Diagnosis Date Noted  . Pain and swelling of right lower extremity 01/02/2017  . Parkinson's disease ([place]) 01/02/2017    Alanson Puls, PT DPT 07/16/2017, 1:18 PM  [place] MAIN [facility] SERVICES [address] [place], [place], [address] Phone: [phone]   Fax:  [phone]  Name: Kelsey Owens MRN: [id number] Date of Birth: Apr 11, 1940

## 2017-07-18 ENCOUNTER — Ambulatory Visit: Payer: Medicare Other | Admitting: Occupational Therapy

## 2017-07-18 ENCOUNTER — Other Ambulatory Visit: Payer: Self-pay

## 2017-07-18 ENCOUNTER — Encounter: Payer: Self-pay | Admitting: Occupational Therapy

## 2017-07-18 DIAGNOSIS — M6281 Muscle weakness (generalized): Secondary | ICD-10-CM

## 2017-07-18 DIAGNOSIS — R262 Difficulty in walking, not elsewhere classified: Secondary | ICD-10-CM | POA: Diagnosis not present

## 2017-07-18 DIAGNOSIS — R278 Other lack of coordination: Secondary | ICD-10-CM

## 2017-07-18 NOTE — Therapy (Signed)
[place] MAIN [facility] SERVICES [address] [place], [place], [address] Phone: [phone]   Fax:  [phone]  Occupational Therapy Evaluation  Patient Details  Name: Kelsey Owens MRN: [id number] Date of Birth: [birth year] Referring Provider: Jennelle Human   Encounter Date: 07/18/2017  OT End of Session - 07/18/17 1718    Visit Number  1    Number of Visits  24    Date for OT Re-Evaluation  10/10/17    Authorization Time Period  Visit 1 of 10 for progress report period starting 07/18/2017    OT Start Time  1300    OT Stop Time  1400    OT Time Calculation (min)  60 min    Activity Tolerance  Patient tolerated treatment well;Treatment limited secondary to agitation;Patient limited by lethargy;Patient limited by pain    Behavior During Therapy  [facility] for tasks assessed/performed       Past Medical History:  Diagnosis Date  . Arthritis   . GERD (gastroesophageal reflux disease)   . Parkinson disease [facility])     Past Surgical History:  Procedure Laterality Date  . ABDOMINAL HYSTERECTOMY    . CHOLECYSTECTOMY    . DEEP BRAIN STIMULATOR PLACEMENT    . HEMORROIDECTOMY      There were no vitals filed for this visit.  Subjective Assessment - 07/18/17 1724    Subjective   Pt. reports her husband is very helpful, and patient with her.    Patient is accompained by:  Family member    Pertinent History  Pt. is a 77 y.o. femalewith a history of Parkinsons Disease. Pt. has a Deep Brain Stimulator, and has recently had it adjusted. Pt. has had multiple falls, and has a history of multiple wrist fractures.     Currently in Pain?  No/denies        [facility] OT Assessment - 07/18/17 1313      Assessment   Medical Diagnosis  Parkinson's Disease    Referring Provider  Jennelle Human    Onset Date/Surgical Date  06/19/15    Hand Dominance  Right    Prior Therapy  Outpatient therapy      Precautions   Precautions  Fall      Restrictions   Weight  Bearing Restrictions  No      Balance Screen   Has the patient fallen in the past 6 months  Yes    How many times?  12    Has the patient had a decrease in activity level because of a fear of falling?   No    Is the patient reluctant to leave their home because of a fear of falling?   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  Sewing-hasnt sewed in over a year.      ADL   Eating/Feeding  Independent Husband assist with cutting food    Grooming  Independent    Upper Body Bathing  Independent    Lower Body Bathing  Independent    Upper Body Dressing  Independent;Increased time Husband assist with fastening a bra    Lower Body Dressing  Increased time;Independent    Armed forces technical officer  Moderate assistance    Toileting -  Hygiene  Independent      IADL   Shopping  Completely unable to shop    Light Housekeeping  Needs help with all home maintenance tasks    Meal Prep  Able to complete simple cold meal and snack prep    Medication Management  Is not capable of dispensing or managing own medication    Financial Management  Dependent      Written Expression   Dominant Hand  Right    Handwriting  25% legible      Vision - History   Baseline Vision  -- Left eye blindness. Wears glasses.      Activity Tolerance   Activity Tolerance  Tolerates < 10 min activity with changes in vital signs      Cognition   Memory  Impaired    Memory Impairment   Decreased recall of new information;Decreased long term memory;Decreased short term memory    Awareness  Appears intact    Problem Solving  Appears intact      Sensation   Light Touch  Appears Intact    Proprioception  Appears Intact      Coordination   Gross Motor Movements are Fluid and Coordinated  No    Fine Motor Movements are Fluid and Coordinated  No    Finger Nose Finger Test  Intact    Right 9 Hole Peg Test  45    Left 9 Hole Peg Test  64      Strength   Overall Strength Comments  Left UE strength 4/5 overall, RUE shoulder abduction 3+/5, flexion, elbow flexion, extension, wrist extension: 4/5      Hand Function   Right Hand Grip (lbs)  37    Right Hand Lateral Pinch  16 lbs    Right Hand 3 Point Pinch  14 lbs    Left Hand Grip (lbs)  25    Left Hand Lateral Pinch  14 lbs    Left 3 point pinch  11 lbs                      OT Education - 07/18/17 1718    Education Details  UE functioning    Person(s) Educated  Patient    Methods  Explanation    Comprehension  Verbalized understanding          OT Long Term Goals - 07/18/17 1739      OT LONG TERM GOAL #1   Title  Pt. will improve BUE strength by 2 muscle grades to improve ADL, and IADL functioning.    Baseline  Eval: Limited BUE strength: Refer to flowsheet for detailed objective measurements.    Time  12    Period  Weeks    Status  New    Target Date  10/10/17      OT LONG TERM GOAL #2   Title  Pt. will increase Left grip strength by 5# to be able to open jars, and containers.    Baseline  Eval: Limited grip strength    Time  12    Period  Weeks    Status  New    Target Date  10/10/17      OT LONG TERM GOAL #3   Title  Pt. will improve left hand [facility] skills  to be able to take items (cards/change) out of a wallet.    Baseline  Eval: limited [facility] skills    Time  12    Period  Weeks    Status  New    Target Date  10/10/17      OT LONG TERM GOAL #4   Title  Pt. will independently  write one sentence efficiently with 75% legibility.    Baseline  Eval: 25% legibility for name only.    Time  12    Period  Weeks    Status  New    Target Date  10/10/17      OT LONG TERM GOAL #5   Title  Pt. independently demonstrate work simplification strategies, and compensatory startegies for safe IADL tasks/home management tasks.       Baseline  Eval: Pt. has difficulty    Time  12    Period  Weeks    Status  New    Target Date  10/10/17      OT LONG TERM GOAL #6   Title  Pt. will independently demonstrate work simplification strategies, and compensatory startegies within the kitchen.    Baseline  Eval: Pt. is unable    Time  12    Period  Weeks    Status  New    Target Date  10/10/17            Plan - 07/18/17 1721    Clinical Impression Statement  Pt. is a 77 y.o. female who has Parkinson's Disease with a Deep Brain, Stimulator. Pt. has a history of multple falls resulting from wrist fractures. Pt. presents with impaired motor control, coordination skills, and decreased strength. Pt. requires verbal, visual, and tactile cues for motor planning movements, and requires assist to complete the full movement during transfers. Pt. scored 67/80 on the MAM-20 For Patients with Neurological Conditions. Pt. will benefit from skilled OT services to improve UE strength, motor control, and coordination skiills and  to improve safety duirng ADL/IADL tasks.    Occupational Profile and client history currently impacting functional performance  Pt. resides with her husband who is a Theme park manager. Pt. has grown children.    Occupational performance deficits (Please refer to evaluation for details):  ADL's;IADL's    Current Impairments/barriers affecting progress:  Positive Barriers: age, motivation, family support. Negative Barriers: multiple comorbidities, history of multiple falls.    OT Frequency  2x / week    OT Duration  12 weeks    OT Treatment/Interventions  Self-care/ADL  training;Electrical Stimulation;Neuromuscular education;Therapeutic exercise;DME and/or AE instruction;Visual/perceptual remediation/compensation;Patient/family education;Therapeutic activities    Clinical Decision Making  Multiple treatment options, significant modification of task necessary    Consulted and Agree with Plan of Care  Patient       Patient will benefit from skilled therapeutic intervention in order to improve the following deficits and impairments:  Abnormal gait, Decreased cognition, Pain, Decreased strength, Impaired UE functional use, Decreased balance, Decreased activity tolerance  Visit Diagnosis: Muscle weakness (generalized)  Other lack of coordination    Problem List Patient Active Problem List   Diagnosis Date Noted  . Pain and swelling of right lower extremity 01/02/2017  . Parkinson's  disease ([place]) 01/02/2017    Harrel Carina, MS, OTR/L 07/18/2017, 6:14 PM  [place] MAIN [facility] SERVICES [address] [place], [place], [address] Phone: [phone]   Fax:  [phone]  Name: Kelsey Owens MRN: [id number] Date of Birth: 11-20-1940

## 2017-07-23 ENCOUNTER — Encounter: Payer: Self-pay | Admitting: Physical Therapy

## 2017-07-23 ENCOUNTER — Ambulatory Visit: Payer: Medicare Other | Admitting: Physical Therapy

## 2017-07-23 DIAGNOSIS — R262 Difficulty in walking, not elsewhere classified: Secondary | ICD-10-CM | POA: Diagnosis not present

## 2017-07-23 DIAGNOSIS — M6281 Muscle weakness (generalized): Secondary | ICD-10-CM

## 2017-07-23 DIAGNOSIS — R490 Dysphonia: Secondary | ICD-10-CM

## 2017-07-23 DIAGNOSIS — R278 Other lack of coordination: Secondary | ICD-10-CM

## 2017-07-23 NOTE — Therapy (Signed)
[place] MAIN [facility] SERVICES [address] [place], [place], [address] Phone: [phone]   Fax:  [phone]  Physical Therapy Treatment  Patient Details  Name: Kelsey Owens MRN: [id number] Date of Birth: 1940/05/08 Referring Provider: Jennelle Human   Encounter Date: 07/23/2017  PT End of Session - 07/23/17 1450    Visit Number  2    Number of Visits  17    Date for PT Re-Evaluation  09/10/17    PT Start Time  0235    PT Stop Time  0315    PT Time Calculation (min)  40 min    Equipment Utilized During Treatment  Gait belt    Activity Tolerance  Patient tolerated treatment well    Behavior During Therapy  [facility] for tasks assessed/performed       Past Medical History:  Diagnosis Date  . Arthritis   . GERD (gastroesophageal reflux disease)   . Parkinson disease [facility])     Past Surgical History:  Procedure Laterality Date  . ABDOMINAL HYSTERECTOMY    . CHOLECYSTECTOMY    . DEEP BRAIN STIMULATOR PLACEMENT    . HEMORROIDECTOMY      There were no vitals filed for this visit.  Subjective Assessment - 07/23/17 1449    Subjective  Patient is doing ok today, hier right hip is hurting 4/10     Pertinent History  Patient has had parkinsons disease for the last 20 years. Her mobility has been getting worse after she had a left radius fx.  She was walking with RW in 10/18.  Patient has worseing loss of balance beginning 10/18.  She had HHPT 2 x week for 2 months beginining after hospitilization in Dec 2018.     Patient Stated Goals  Patient wants to be able to walk better and not fall as often    Currently in Pain?  Yes    Pain Score  4     Pain Location  Hip    Pain Orientation  Right    Pain Descriptors / Indicators  Aching      Therapeutic activities:  Transfers from wc to nu-step;  nu-step to wc ; wc to mat ; mat to wc with mod assist with max vc for sequencing and safety     Therapeutic exercise   sidelying hip abd x 10 x  2 RLE , unable to lie on right side  Supine hip abd/add x 10 x 2 BLE  Heel slides left and right x 10   sidelying hip clam x 10 x 2 RLE with left sidelying   hooklying marching x 10 x 2  Bridging x 10 x 2  SLR x 10 x 2    Patient needs max verbal cues for correct technique and positions. She reports right hip pain in right sidelying position                      PT Education - 07/23/17 1450    Education Details  HEP    Person(s) Educated  Patient    Methods  Explanation;Demonstration;Tactile cues;Verbal cues    Comprehension  Verbalized understanding;Returned demonstration;Verbal cues required       PT Short Term Goals - 07/16/17 1441      PT SHORT TERM GOAL #1   Title  Patient will be independent in home exercise program to improve strength/mobility for better functional independence with ADLs.    Time  4  Period  Weeks    Status  New    Target Date  08/13/17      PT SHORT TERM GOAL #2   Title  Patient will perofrm sit to stand with correct hand placement and without support on the back of the chair with RW  and SBA.     Baseline  Patient needs max assist and needs max assist for correct hand placement.    Time  4    Period  Weeks    Status  New    Target Date  08/13/17      PT SHORT TERM GOAL #3   Title  Patient will stand for 1 minute with UE support to be able to perform safe transfers.     Baseline  Patient can stand for 10 seconds    Time  4    Period  Weeks    Status  New    Target Date  08/13/17        PT Long Term Goals - 07/16/17 1515      PT LONG TERM GOAL #1   Title  Patient will be able to ambulate in parallel bars x 10 feet with min assist.    Baseline  unable to ambulate with AD    Time  8    Period  Weeks    Status  New    Target Date  09/10/17      PT LONG TERM GOAL #2   Title  Patient will ambulate short distances ,25 feet, with RW and mod assist.     Baseline  unable to ambulate with AD    Time  8    Period   Weeks    Status  New    Target Date  09/10/17      PT LONG TERM GOAL #3   Title  Patient will be able to ascend 1 step with min assist with LARD.    Baseline  max assist with HHA    Time  8    Period  Weeks    Status  New    Target Date  09/10/17            Plan - 07/23/17 1451    Clinical Impression Statement  Patient instructed in intermediate strengthening and mobility training.   Patient requires max assist and  Vcs for correct exercise technique including to improve LE  control with standing exercise. Patient demonstrates poor quad control with SLR and lacks 20 deg of knee extension. Patient would benefit from additional skilled PT intervention to improve mobility  safety and reduce fall risk.    Rehab Potential  Good    PT Frequency  2x / week    PT Duration  8 weeks    PT Treatment/Interventions  Therapeutic exercise;Therapeutic activities;Functional mobility training;Stair training;Balance training;Neuromuscular re-education;Patient/family education;Aquatic Therapy    PT Next Visit Plan  mobility training, strengthening, balanace training    PT Home Exercise Plan  LAQ, hip flex    Consulted and Agree with Plan of Care  Patient;Family member/caregiver       Patient will benefit from skilled therapeutic intervention in order to improve the following deficits and impairments:  Decreased balance, Decreased endurance, Decreased mobility, Decreased safety awareness, Decreased strength, Decreased activity tolerance  Visit Diagnosis: Muscle weakness (generalized)  Other lack of coordination  Difficulty in walking, not elsewhere classified  Dysphonia     Problem List Patient Active Problem List   Diagnosis Date Noted  .  Pain and swelling of right lower extremity 01/02/2017  . Parkinson's disease ([place]) 01/02/2017    Alanson Puls, PT DPT 07/23/2017, 3:02 PM  [place] MAIN [facility] SERVICES [address] [place],  [place], [address] Phone: [phone]   Fax:  [phone]  Name: Kelsey Owens MRN: [id number] Date of Birth: [birth year]

## 2017-07-26 ENCOUNTER — Ambulatory Visit: Payer: Medicare Other | Admitting: Physical Therapy

## 2017-07-26 ENCOUNTER — Encounter: Payer: Self-pay | Admitting: Physical Therapy

## 2017-07-26 DIAGNOSIS — R262 Difficulty in walking, not elsewhere classified: Secondary | ICD-10-CM | POA: Diagnosis not present

## 2017-07-26 DIAGNOSIS — R278 Other lack of coordination: Secondary | ICD-10-CM

## 2017-07-26 DIAGNOSIS — M6281 Muscle weakness (generalized): Secondary | ICD-10-CM

## 2017-07-26 NOTE — Therapy (Signed)
[place] MAIN [facility] SERVICES [address] [place], [place], [address] Phone: [phone]   Fax:  [phone]  Physical Therapy Treatment  Patient Details  Name: Kelsey Owens MRN: [id number] Date of Birth: November 15, 1940 Referring Provider: Jennelle Human   Encounter Date: 07/26/2017  PT End of Session - 07/26/17 1602    Visit Number  3    Number of Visits  17    Date for PT Re-Evaluation  09/10/17    PT Start Time  1602    PT Stop Time  1646    PT Time Calculation (min)  44 min    Equipment Utilized During Treatment  Gait belt    Activity Tolerance  Patient tolerated treatment well    Behavior During Therapy  [facility] for tasks assessed/performed       Past Medical History:  Diagnosis Date  . Arthritis   . GERD (gastroesophageal reflux disease)   . Parkinson disease [facility])     Past Surgical History:  Procedure Laterality Date  . ABDOMINAL HYSTERECTOMY    . CHOLECYSTECTOMY    . DEEP BRAIN STIMULATOR PLACEMENT    . HEMORROIDECTOMY      There were no vitals filed for this visit.  Subjective Assessment - 07/26/17 1609    Subjective  Pt reports she is doing well today.  Her husband was excited to report that the pt was able to get out of her chair independently today to crawl across the room and pull up to a different chair.  Pt reports she has had several falls since last session.  Pt is having 8/10 R shoulder pain which is chronic.     Pertinent History  Patient has had parkinsons disease for the last 20 years. Her mobility has been getting worse after she had a left radius fx.  She was walking with RW in 10/18.  Patient has worseing loss of balance beginning 10/18.  She had HHPT 2 x week for 2 months beginining after hospitilization in Dec 2018.     Patient Stated Goals  Patient wants to be able to walk better and not fall as often    Currently in Pain?  Yes    Pain Score  8     Pain Location  Shoulder    Pain Orientation  Right     Pain Descriptors / Indicators  Aching    Pain Type  Chronic pain    Pain Onset  More than a month ago        TREATMENT   Vital signs taken at start of session in sitting: 105/48, SpO2 98%, pulse 100    Therapeutic activities:   Transfers from wc to mat table with RW and max assist and then back to [facility] at end of session with same. Pt demonstrates freezing and difficulty initiating movement.   Pt requires min assist for positioning following modI sit>supine transfer. Pt demonstrates SOB with this, SpO2 94% on RA.   Sit<>stand x2 with mod assist with UE support on // bars once standing   Tactile feedback and assist provided at glutes and ischial tuberosity for upright posture with mirror feedback as pt feels like she is leaning forward   Standing weight shifts to L and R 2x10 each direction     Therapeutic Exercise:   Supine hip abd/add x10 BLE   Hooklying hip Abd/ER with RTB around knees, fatigue noted at end of each set, especially with LLE. 2x10   Hooklying marching 2x20  each LE   Heel slides with 2# ankle weights x10 each LE                         PT Education - 07/26/17 1602    Education Details  Exercise technique    Person(s) Educated  Patient    Methods  Explanation;Demonstration;Verbal cues    Comprehension  Verbalized understanding;Returned demonstration;Verbal cues required;Need further instruction       PT Short Term Goals - 07/16/17 1441      PT SHORT TERM GOAL #1   Title  Patient will be independent in home exercise program to improve strength/mobility for better functional independence with ADLs.    Time  4    Period  Weeks    Status  New    Target Date  08/13/17      PT SHORT TERM GOAL #2   Title  Patient will perofrm sit to stand with correct hand placement and without support on the back of the chair with RW  and SBA.     Baseline  Patient needs max assist and needs max assist for correct hand placement.    Time  4     Period  Weeks    Status  New    Target Date  08/13/17      PT SHORT TERM GOAL #3   Title  Patient will stand for 1 minute with UE support to be able to perform safe transfers.     Baseline  Patient can stand for 10 seconds    Time  4    Period  Weeks    Status  New    Target Date  08/13/17        PT Long Term Goals - 07/16/17 1515      PT LONG TERM GOAL #1   Title  Patient will be able to ambulate in parallel bars x 10 feet with min assist.    Baseline  unable to ambulate with AD    Time  8    Period  Weeks    Status  New    Target Date  09/10/17      PT LONG TERM GOAL #2   Title  Patient will ambulate short distances ,25 feet, with RW and mod assist.     Baseline  unable to ambulate with AD    Time  8    Period  Weeks    Status  New    Target Date  09/10/17      PT LONG TERM GOAL #3   Title  Patient will be able to ascend 1 step with min assist with LARD.    Baseline  max assist with HHA    Time  8    Period  Weeks    Status  New    Target Date  09/10/17            Plan - 07/26/17 1646    Clinical Impression Statement  Pt demonstrated fatigue at end of each set of all  therapeutic exercises and was given rest breaks between each exercise. When performing stand pivot transfer to and from [facility] the pt demonstrates difficulty with initiation and demonstrates freezing.  Practiced sit<>stand transfer in // bars with mod assist to boost to standing.  Mirror feedback utilized to weight shift forward for pt to achieve upright posture instead of her baseline posterior lean. Weight shifts performed in standing.  Pt will benefit from  skilled PT interventions for improved strength and independence.      Rehab Potential  Good    PT Frequency  2x / week    PT Duration  8 weeks    PT Treatment/Interventions  Therapeutic exercise;Therapeutic activities;Functional mobility training;Stair training;Balance training;Neuromuscular re-education;Patient/family education;Aquatic Therapy     PT Next Visit Plan  mobility training, strengthening, balanace training    PT Home Exercise Plan  LAQ, hip flex    Consulted and Agree with Plan of Care  Patient;Family member/caregiver       Patient will benefit from skilled therapeutic intervention in order to improve the following deficits and impairments:  Decreased balance, Decreased endurance, Decreased mobility, Decreased safety awareness, Decreased strength, Decreased activity tolerance  Visit Diagnosis: Muscle weakness (generalized)  Other lack of coordination  Difficulty in walking, not elsewhere classified     Problem List Patient Active Problem List   Diagnosis Date Noted  . Pain and swelling of right lower extremity 01/02/2017  . Parkinson's disease ([place]) 01/02/2017    Collie Siad PT, DPT 07/26/2017, 4:49 PM  [place] MAIN [facility] SERVICES [address] [place], [place], [address] Phone: [phone]   Fax:  [phone]  Name: Georgie Eduardo MRN: [id number] Date of Birth: 03/17/1940

## 2017-07-31 ENCOUNTER — Ambulatory Visit: Payer: Medicare Other | Admitting: Occupational Therapy

## 2017-07-31 ENCOUNTER — Encounter: Payer: Self-pay | Admitting: Physical Therapy

## 2017-07-31 ENCOUNTER — Encounter: Payer: Self-pay | Admitting: Occupational Therapy

## 2017-07-31 ENCOUNTER — Ambulatory Visit: Payer: Medicare Other | Admitting: Physical Therapy

## 2017-07-31 DIAGNOSIS — R278 Other lack of coordination: Secondary | ICD-10-CM

## 2017-07-31 DIAGNOSIS — M6281 Muscle weakness (generalized): Secondary | ICD-10-CM

## 2017-07-31 DIAGNOSIS — R262 Difficulty in walking, not elsewhere classified: Secondary | ICD-10-CM

## 2017-07-31 NOTE — Patient Instructions (Signed)
Hip Flexion / Extension: Sagittal Plane Stability    Side-lying, legs slightly forward. Lift top leg. Move leg forward and backward. Keep pelvis still. Challenge: Hand on hip not floor. Do _10__ times, each leg, _2__ times per day. Option: Double pump at each swing.  http://ss.exer.us/83   Copyright  VHI. All rights reserved.  Flexors, Supine Bridge    Lie supine, feet shoulder-width apart. Lift hips toward ceiling. Hold ___ seconds. Repeat 10___ times per session. Do _2__ sessions per day.  Copyright  VHI. All rights reserved.  Hip Abduction / Adduction: with Extended Knee (Supine)    Bring left leg out to side and return. Keep knee straight. Repeat _10___ times per set. Do __2__ sets per session. Do __7__ sessions per day.  http://orth.exer.us/681   Copyright  VHI. All rights reserved.

## 2017-07-31 NOTE — Therapy (Signed)
[place] MAIN [facility] SERVICES [address] [place], [place], [address] Phone: [phone]   Fax:  [phone]  Occupational Therapy Treatment  Patient Details  Name: Kelsey Owens MRN: [id number] Date of Birth: [birth year] Referring Provider: Jennelle Human   Encounter Date: 07/31/2017  OT End of Session - 07/31/17 1745    Visit Number  2    Number of Visits  24    Date for OT Re-Evaluation  10/10/17    Authorization Time Period  Visit 2 of 10 for progress report period starting 07/18/2017    OT Start Time  1645    OT Stop Time  1730    OT Time Calculation (min)  45 min    Activity Tolerance  Patient tolerated treatment well;Treatment limited secondary to agitation;Patient limited by lethargy;Patient limited by pain    Behavior During Therapy  [facility] for tasks assessed/performed       Past Medical History:  Diagnosis Date  . Arthritis   . GERD (gastroesophageal reflux disease)   . Parkinson disease [facility]     Past Surgical History:  Procedure Laterality Date  . ABDOMINAL HYSTERECTOMY    . CHOLECYSTECTOMY    . DEEP BRAIN STIMULATOR PLACEMENT    . HEMORROIDECTOMY      There were no vitals filed for this visit.  Subjective Assessment - 07/31/17 1740    Subjective   Pt. reports she is doing well.    Patient is accompained by:  Family member    Pertinent History  Pt. is a 77 y.o. female with a history of Parkinsons Disease. Pt. has a Deep Brain Stimulator, and has recently had it adjusted. Pt. has had multiple falls, and has a history of multiple wrist fractures.     Currently in Pain?  No/denies       OT TREATMENT    Therapeutic Exercise:  Pt. performed left hand gross gripping with grip strengthener. Pt. worked on sustaining grip while grasping pegs and reaching at various heights. Gripper was placed in the 2nd resistive slot with the white resistive spring. Pt. Worked on pinch strengthening in the left hand for lateral, and 3pt.  pinch using yellow, red, green resistive clips. Pt. worked on placing the clips at various vertical and horizontal angles. Tactile and verbal cues were required for eliciting the desired movement.                         OT Education - 07/31/17 1744    Education Details  BUE functioning    Person(s) Educated  Patient    Methods  Explanation    Comprehension  Verbalized understanding          OT Long Term Goals - 07/18/17 1739      OT LONG TERM GOAL #1   Title  Pt. will improve BUE strength by 2 muscle grades to improve ADL, and IADL functioning.    Baseline  Eval: Limited BUE strength: Refer to flowsheet for detailed objective measurements.    Time  12    Period  Weeks    Status  New    Target Date  10/10/17      OT LONG TERM GOAL #2   Title  Pt. will increase Left grip strength by 5# to be able to open jars, and containers.    Baseline  Eval: Limited grip strength    Time  12    Period  Weeks  Status  New    Target Date  10/10/17      OT LONG TERM GOAL #3   Title  Pt. will improve left hand [facility] skills  to be able to take items (cards/change) out of a wallet.    Baseline  Eval: limited [facility] skills    Time  12    Period  Weeks    Status  New    Target Date  10/10/17      OT LONG TERM GOAL #4   Title  Pt. will independently write one sentence efficiently with 75% legibility.    Baseline  Eval: 25% legibility for name only.    Time  12    Period  Weeks    Status  New    Target Date  10/10/17      OT LONG TERM GOAL #5   Title  Pt. independently demonstrate work simplification strategies, and compensatory startegies for safe IADL tasks/home management tasks.       Baseline  Eval: Pt. has difficulty    Time  12    Period  Weeks    Status  New    Target Date  10/10/17      OT LONG TERM GOAL #6   Title  Pt. will independently demonstrate work simplification strategies, and compensatory startegies within the kitchen.    Baseline  Eval: Pt. is  unable    Time  12    Period  Weeks    Status  New    Target Date  10/10/17            Plan - 07/31/17 1748    Clinical Impression Statement  Pt. reports being blind in her left eye, and limited vision in her right eye. 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 Pt. continues to work on improving LUE strength, and fine motor coordination skills in order to improve functional hnad use during ADLs, and IADLs.     Occupational Profile and client history currently impacting functional performance  Pt. resides with her husband who is a Theme park manager. Pt. has grown children.    Occupational performance deficits (Please refer to evaluation for details):  ADL's;IADL's    Current Impairments/barriers affecting progress:  Positive Barriers: age, motivation, family support. Negative Barriers: multiple comorbidities, history of multiple falls.    OT Frequency  2x / week    OT Duration  12 weeks    OT Treatment/Interventions  Self-care/ADL training;Electrical Stimulation;Neuromuscular education;Therapeutic exercise;DME and/or AE instruction;Visual/perceptual remediation/compensation;Patient/family education;Therapeutic activities    Clinical Decision Making  Multiple treatment options, significant modification of task necessary    Consulted and Agree with Plan of Care  Patient       Patient will benefit from skilled therapeutic intervention in order to improve the following deficits and impairments:  Abnormal gait, Decreased cognition, Pain, Decreased strength, Impaired UE functional use, Decreased balance, Decreased activity tolerance  Visit Diagnosis: Muscle weakness (generalized)  Other lack of coordination    Problem List Patient Active Problem List   Diagnosis Date Noted  . Pain and swelling of right lower extremity 01/02/2017  . Parkinson's disease ([place]) 01/02/2017    Harrel Carina, MS, OTR/L 07/31/2017, 6:04 PM  [place] MAIN [facility]  SERVICES [address] [place], [place], [address] Phone: [phone]   Fax:  [phone]  Name: Kelsey Owens MRN: [id number] Date of Birth: 30-Mar-1940

## 2017-08-01 NOTE — Therapy (Signed)
[place] MAIN [facility] SERVICES [address] [place], [place], [address] Phone: [phone]   Fax:  [phone]  Physical Therapy Treatment  Patient Details  Name: Kelsey Owens MRN: [id number] Date of Birth: 1940/03/20 Referring Provider: Jennelle Human   Encounter Date: 07/31/2017  PT End of Session - 07/31/17 1648    Visit Number  4    Number of Visits  17    Date for PT Re-Evaluation  09/10/17    PT Start Time  0400    PT Stop Time  0445    PT Time Calculation (min)  45 min    Equipment Utilized During Treatment  Gait belt    Activity Tolerance  Patient tolerated treatment well    Behavior During Therapy  [facility] for tasks assessed/performed       Past Medical History:  Diagnosis Date  . Arthritis   . GERD (gastroesophageal reflux disease)   . Parkinson disease [facility])     Past Surgical History:  Procedure Laterality Date  . ABDOMINAL HYSTERECTOMY    . CHOLECYSTECTOMY    . DEEP BRAIN STIMULATOR PLACEMENT    . HEMORROIDECTOMY      There were no vitals filed for this visit.  Subjective Assessment - 07/31/17 1647    Subjective  Patient is having right shoulder pain 4/10 and her hip is much better.     Pertinent History  Patient has had parkinsons disease for the last 20 years. Her mobility has been getting worse after she had a left radius fx.  She was walking with RW in 10/18.  Patient has worseing loss of balance beginning 10/18.  She had HHPT 2 x week for 2 months beginining after hospitilization in Dec 2018.     Patient Stated Goals  Patient wants to be able to walk better and not fall as often    Currently in Pain?  Yes    Pain Score  4     Pain Location  Shoulder    Pain Orientation  Right    Pain Descriptors / Indicators  Aching    Pain Onset  More than a month ago       Treatment:  Transfer training from wc to nu-step; nu-step to wc; wc to stand x 5 in parallel bars with mod assist   Gait training in parallel bars  with mod assist and difficulty with motor control RLE and knee flexion during stance phase of gait and use of BUE to assist x 30 feet, 40 feet, 60 feet with high fatigue   Therapeutic exercise: Standing hip abd x 10 BLE  Standing hip ext x 10 BLE  Patient has poor posture during exercises and has difficulty standing on single leg to perform standing exercises without min assist and parallel bars  Patient reports that her leg does not hurt during treatment today                        PT Education - 07/31/17 1648    Education Details  HEP    Person(s) Educated  Patient    Methods  Explanation    Comprehension  Verbalized understanding       PT Short Term Goals - 07/16/17 1441      PT SHORT TERM GOAL #1   Title  Patient will be independent in home exercise program to improve strength/mobility for better functional independence with ADLs.    Time  4  Period  Weeks    Status  New    Target Date  08/13/17      PT SHORT TERM GOAL #2   Title  Patient will perofrm sit to stand with correct hand placement and without support on the back of the chair with RW  and SBA.     Baseline  Patient needs max assist and needs max assist for correct hand placement.    Time  4    Period  Weeks    Status  New    Target Date  08/13/17      PT SHORT TERM GOAL #3   Title  Patient will stand for 1 minute with UE support to be able to perform safe transfers.     Baseline  Patient can stand for 10 seconds    Time  4    Period  Weeks    Status  New    Target Date  08/13/17        PT Long Term Goals - 07/16/17 1515      PT LONG TERM GOAL #1   Title  Patient will be able to ambulate in parallel bars x 10 feet with min assist.    Baseline  unable to ambulate with AD    Time  8    Period  Weeks    Status  New    Target Date  09/10/17      PT LONG TERM GOAL #2   Title  Patient will ambulate short distances ,25 feet, with RW and mod assist.     Baseline  unable to  ambulate with AD    Time  8    Period  Weeks    Status  New    Target Date  09/10/17      PT LONG TERM GOAL #3   Title  Patient will be able to ascend 1 step with min assist with LARD.    Baseline  max assist with HHA    Time  8    Period  Weeks    Status  New    Target Date  09/10/17            Plan - 07/31/17 1756    Clinical Impression Statement  Patient demonstrates deficits with postural control in gait in parallel bars and poor coordination with motor control. . Patient demonstrated hesitation with full weight shifting and on uneven surfaces but minimal cueing resulted in good technique and no LOB.  Patient improved ability to challenge  sterength with supervision and with UE assist today.   Patient will continue to benefit from skilled physical therapy to improve endurance and dynamic balance to reduce fall risk    Rehab Potential  Good    PT Frequency  2x / week    PT Duration  8 weeks    PT Treatment/Interventions  Therapeutic exercise;Therapeutic activities;Functional mobility training;Stair training;Balance training;Neuromuscular re-education;Patient/family education;Aquatic Therapy    PT Next Visit Plan  mobility training, strengthening, balanace training    PT Home Exercise Plan  LAQ, hip flex    Consulted and Agree with Plan of Care  Patient;Family member/caregiver       Patient will benefit from skilled therapeutic intervention in order to improve the following deficits and impairments:  Decreased balance, Decreased endurance, Decreased mobility, Decreased safety awareness, Decreased strength, Decreased activity tolerance  Visit Diagnosis: Muscle weakness (generalized)  Other lack of coordination  Difficulty in walking, not elsewhere classified     Problem  List Patient Active Problem List   Diagnosis Date Noted  . Pain and swelling of right lower extremity 01/02/2017  . Parkinson's disease ([place]) 01/02/2017    Alanson Puls, PT DPT 08/01/2017,  8:16 AM  [place] MAIN [facility] SERVICES [address] [place], [place], [address] Phone: [phone]   Fax:  [phone]  Name: Kelsey Owens MRN: [id number] Date of Birth: 27-Feb-1940

## 2017-08-02 ENCOUNTER — Ambulatory Visit: Payer: Medicare Other | Admitting: Occupational Therapy

## 2017-08-02 ENCOUNTER — Ambulatory Visit: Payer: Medicare Other

## 2017-08-07 ENCOUNTER — Ambulatory Visit: Payer: Medicare Other | Admitting: Occupational Therapy

## 2017-08-07 ENCOUNTER — Encounter: Payer: Self-pay | Admitting: Physical Therapy

## 2017-08-07 ENCOUNTER — Encounter: Payer: Self-pay | Admitting: Occupational Therapy

## 2017-08-07 ENCOUNTER — Ambulatory Visit: Payer: Medicare Other | Admitting: Physical Therapy

## 2017-08-07 DIAGNOSIS — M6281 Muscle weakness (generalized): Secondary | ICD-10-CM

## 2017-08-07 DIAGNOSIS — R262 Difficulty in walking, not elsewhere classified: Secondary | ICD-10-CM | POA: Diagnosis not present

## 2017-08-07 DIAGNOSIS — M79604 Pain in right leg: Secondary | ICD-10-CM

## 2017-08-07 DIAGNOSIS — M7989 Other specified soft tissue disorders: Principal | ICD-10-CM

## 2017-08-07 DIAGNOSIS — R278 Other lack of coordination: Secondary | ICD-10-CM

## 2017-08-07 DIAGNOSIS — G20 Parkinson's disease: Secondary | ICD-10-CM
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
## 2017-08-07 NOTE — Therapy (Addendum)
[place] MAIN [facility] SERVICES [address] [place], [place], [address] Phone: [phone]   Fax:  [phone]  Physical Therapy Treatment  Patient Details  Name: Kelsey Owens MRN: [id number] Date of Birth: 30-Jan-1941 Referring Provider: Jennelle Human   Encounter Date: 08/07/2017  PT End of Session - 08/07/17 1646    Visit Number  5    Number of Visits  17    Date for PT Re-Evaluation  09/10/17    PT Start Time  0400    PT Stop Time  0445    PT Time Calculation (min)  45 min    Equipment Utilized During Treatment  Gait belt    Activity Tolerance  Patient tolerated treatment well    Behavior During Therapy  [facility] for tasks assessed/performed       Past Medical History:  Diagnosis Date  . Arthritis   . GERD (gastroesophageal reflux disease)   . Parkinson disease [facility])     Past Surgical History:  Procedure Laterality Date  . ABDOMINAL HYSTERECTOMY    . CHOLECYSTECTOMY    . DEEP BRAIN STIMULATOR PLACEMENT    . HEMORROIDECTOMY      There were no vitals filed for this visit.  Subjective Assessment - 08/07/17 1635    Subjective  Patient is having bilateral groin  pain 4/10.    Pertinent History  Patient has had parkinsons disease for the last 20 years. Her mobility has been getting worse after she had a left radius fx.  She was walking with RW in 10/18.  Patient has worseing loss of balance beginning 10/18.  She had HHPT 2 x week for 2 months beginining after hospitilization in Dec 2018.     Patient Stated Goals  Patient wants to be able to walk better and not fall as often    Currently in Pain?  Yes    Pain Score  4     Pain Location  Hip    Pain Orientation  Right    Pain Descriptors / Indicators  Aching    Pain Type  Chronic pain    Pain Onset  More than a month ago    Aggravating Factors   standing    Multiple Pain Sites  -- 4/10 right shoulder       Treatment:  Transfer training from wc to nu-step; nu-step to wc; wc  to stand x 5 in parallel bars with min  assist , patient reports that her right shoulder is sore today  Gait training in parallel bars with min assist and difficulty with motor control RLE and knee flexion during stance phase of gait and use of BUE to assist x 30 feet  X 3  with less fatigue and better posture with better knee extension    Therapeutic exercise: Supine  hip abd x 10 BLE  Supine  hip flex x 10 BLE Bridging x 10   Patient has poor motor control  during exercises and has difficulty with transfers due to right UE pain  without min assist  Patient reports that both legs hurt in her groin bilaterally during treatment today                        PT Education - 08/07/17 1645    Education Details  HEP    Person(s) Educated  Patient    Methods  Explanation;Demonstration;Tactile cues    Comprehension  Returned demonstration;Verbalized understanding  PT Short Term Goals - 07/16/17 1441      PT SHORT TERM GOAL #1   Title  Patient will be independent in home exercise program to improve strength/mobility for better functional independence with ADLs.    Time  4    Period  Weeks    Status  New    Target Date  08/13/17      PT SHORT TERM GOAL #2   Title  Patient will perofrm sit to stand with correct hand placement and without support on the back of the chair with RW  and SBA.     Baseline  Patient needs max assist and needs max assist for correct hand placement.    Time  4    Period  Weeks    Status  New    Target Date  08/13/17      PT SHORT TERM GOAL #3   Title  Patient will stand for 1 minute with UE support to be able to perform safe transfers.     Baseline  Patient can stand for 10 seconds    Time  4    Period  Weeks    Status  New    Target Date  08/13/17        PT Long Term Goals - 07/16/17 1515      PT LONG TERM GOAL #1   Title  Patient will be able to ambulate in parallel bars x 10 feet with min assist.    Baseline  unable  to ambulate with AD    Time  8    Period  Weeks    Status  New    Target Date  09/10/17      PT LONG TERM GOAL #2   Title  Patient will ambulate short distances ,25 feet, with RW and mod assist.     Baseline  unable to ambulate with AD    Time  8    Period  Weeks    Status  New    Target Date  09/10/17      PT LONG TERM GOAL #3   Title  Patient will be able to ascend 1 step with min assist with LARD.    Baseline  max assist with HHA    Time  8    Period  Weeks    Status  New    Target Date  09/10/17            Plan - 08/07/17 1646    Clinical Impression Statement  Pt is making improvements in BLE strength and balance as evidenced by improvements in transfers and gait.  Patient is ambulating in the parallel bars and is able to stand for 1 min at a time, she transfers with mod assist from wc to mat. . Patient  demonstrates instability with posterior ambulation .  Patient will benefit from continued skilled PT interventions for improved strength, balance, and QOL    Rehab Potential  Good    PT Frequency  2x / week    PT Duration  8 weeks    PT Treatment/Interventions  Therapeutic exercise;Therapeutic activities;Functional mobility training;Stair training;Balance training;Neuromuscular re-education;Patient/family education;Aquatic Therapy    PT Next Visit Plan  mobility training, strengthening, balanace training    PT Home Exercise Plan  LAQ, hip flex    Consulted and Agree with Plan of Care  Patient;Family member/caregiver       Patient will benefit from skilled therapeutic intervention in order to improve the following  deficits and impairments:  Decreased balance, Decreased endurance, Decreased mobility, Decreased safety awareness, Decreased strength, Decreased activity tolerance  Visit Diagnosis: Pain and swelling of right lower extremity  Parkinson's disease [facility])     Problem List Patient Active Problem List   Diagnosis Date Noted  . Pain and swelling of right  lower extremity 01/02/2017  . Parkinson's disease ([place]) 01/02/2017    Alanson Puls, PT DPT 08/07/2017, 5:17 PM  [place] MAIN [facility] SERVICES [address] [place], [place], [address] Phone: [phone]   Fax:  [phone]  Name: Valori Hollenkamp MRN: [id number] Date of Birth: Mar 01, 1940

## 2017-08-09 ENCOUNTER — Ambulatory Visit: Payer: Medicare Other | Admitting: Physical Therapy

## 2017-08-09 ENCOUNTER — Encounter: Payer: Self-pay | Admitting: Physical Therapy

## 2017-08-09 DIAGNOSIS — R262 Difficulty in walking, not elsewhere classified: Secondary | ICD-10-CM

## 2017-08-09 DIAGNOSIS — R490 Dysphonia: Secondary | ICD-10-CM

## 2017-08-09 DIAGNOSIS — G20A1 Parkinson's disease without dyskinesia, without mention of fluctuations: Secondary | ICD-10-CM

## 2017-08-09 DIAGNOSIS — G20 Parkinson's disease: Secondary | ICD-10-CM

## 2017-08-09 DIAGNOSIS — M79604 Pain in right leg: Secondary | ICD-10-CM

## 2017-08-09 DIAGNOSIS — M6281 Muscle weakness (generalized): Secondary | ICD-10-CM

## 2017-08-09 DIAGNOSIS — M7989 Other specified soft tissue disorders: Principal | ICD-10-CM

## 2017-08-09 DIAGNOSIS — R278 Other lack of coordination: Secondary | ICD-10-CM

## 2017-08-09 NOTE — Therapy (Signed)
[place] MAIN [facility] SERVICES [address] [place], [place], [address] Phone: [phone]   Fax:  [phone]  Physical Therapy Treatment  Patient Details  Name: Kelsey Owens MRN: [id number] Date of Birth: [birth year] Referring Provider: Jennelle Human   Encounter Date: 08/09/2017  PT End of Session - 08/09/17 1440    Visit Number  6    Number of Visits  17    Date for PT Re-Evaluation  09/10/17    PT Start Time  0230    PT Stop Time  0315    PT Time Calculation (min)  45 min    Equipment Utilized During Treatment  Gait belt    Activity Tolerance  Patient tolerated treatment well    Behavior During Therapy  [facility] for tasks assessed/performed       Past Medical History:  Diagnosis Date  . Arthritis   . GERD (gastroesophageal reflux disease)   . Parkinson disease [facility])     Past Surgical History:  Procedure Laterality Date  . ABDOMINAL HYSTERECTOMY    . CHOLECYSTECTOMY    . DEEP BRAIN STIMULATOR PLACEMENT    . HEMORROIDECTOMY      There were no vitals filed for this visit.  Subjective Assessment - 08/09/17 1437    Subjective  Patient is having pain 4/10 in right shoulder    Pertinent History  Patient has had parkinsons disease for the last 20 years. Her mobility has been getting worse after she had a left radius fx.  She was walking with RW in 10/18.  Patient has worseing loss of balance beginning 10/18.  She had HHPT 2 x week for 2 months beginining after hospitilization in Dec 2018.     Patient Stated Goals  Patient wants to be able to walk better and not fall as often    Currently in Pain?  Yes    Pain Location  Shoulder    Pain Orientation  Right    Pain Descriptors / Indicators  Aching    Pain Onset  More than a month ago    Multiple Pain Sites  No         Gait training:  Patient performs gait training in parallel bars 10 feet x 4 with min assist for turns 180 deg and UE use, and cues for posture  correction  Patient has flexed knees and hips and RLE has poor coordination  Therapeutic activities: Sit to stand from wc level to nu-step , nu-step to wc, wc to stand in parallel bars x 5  Poor motor control during transfers and min assist for sequencing and direction   Therapeutic exercise: LAQ with 2 lbs x 20 BLE x 2 sets Hip flex with 2 lbs x 20 x 2 sets BLE   Cues for proper technique of exercises, slow eccentric contractions to target specific muscles and facility increased muscle building. Therapeutic rest breaks for energy conservation                        PT Education - 08/09/17 1440    Education Details  HEP    Person(s) Educated  Patient    Methods  Explanation    Comprehension  Verbalized understanding       PT Short Term Goals - 07/16/17 1441      PT SHORT TERM GOAL #1   Title  Patient will be independent in home exercise program to improve strength/mobility for better functional independence with  ADLs.    Time  4    Period  Weeks    Status  New    Target Date  08/13/17      PT SHORT TERM GOAL #2   Title  Patient will perofrm sit to stand with correct hand placement and without support on the back of the chair with RW  and SBA.     Baseline  Patient needs max assist and needs max assist for correct hand placement.    Time  4    Period  Weeks    Status  New    Target Date  08/13/17      PT SHORT TERM GOAL #3   Title  Patient will stand for 1 minute with UE support to be able to perform safe transfers.     Baseline  Patient can stand for 10 seconds    Time  4    Period  Weeks    Status  New    Target Date  08/13/17        PT Long Term Goals - 07/16/17 1515      PT LONG TERM GOAL #1   Title  Patient will be able to ambulate in parallel bars x 10 feet with min assist.    Baseline  unable to ambulate with AD    Time  8    Period  Weeks    Status  New    Target Date  09/10/17      PT LONG TERM GOAL #2   Title  Patient will  ambulate short distances ,25 feet, with RW and mod assist.     Baseline  unable to ambulate with AD    Time  8    Period  Weeks    Status  New    Target Date  09/10/17      PT LONG TERM GOAL #3   Title  Patient will be able to ascend 1 step with min assist with LARD.    Baseline  max assist with HHA    Time  8    Period  Weeks    Status  New    Target Date  09/10/17            Plan - 08/09/17 1440    Clinical Impression Statement  Patient demonstrates BLE  weakness, and impaired transfers and lack of static and dynamic standing balance   that is causing difficulty with sit to stand transfers and standing  activities.  Patient still fatigues quickly to due very weak LE's.  Patient showed poor safety  technique and is able to generate good form after heavy cueing. Patient will continue to benefit from skilled physical therapy to improve LE strength and balance to improve quality of life.     Rehab Potential  Good    PT Frequency  2x / week    PT Duration  8 weeks    PT Treatment/Interventions  Therapeutic exercise;Therapeutic activities;Functional mobility training;Stair training;Balance training;Neuromuscular re-education;Patient/family education;Aquatic Therapy    PT Next Visit Plan  mobility training, strengthening, balanace training    PT Home Exercise Plan  LAQ, hip flex    Consulted and Agree with Plan of Care  Patient;Family member/caregiver       Patient will benefit from skilled therapeutic intervention in order to improve the following deficits and impairments:  Decreased balance, Decreased endurance, Decreased mobility, Decreased safety awareness, Decreased strength, Decreased activity tolerance  Visit Diagnosis: Pain and swelling of right lower  extremity  Parkinson's disease ([place])  Muscle weakness (generalized)  Other lack of coordination  Difficulty in walking, not elsewhere classified  Dysphonia     Problem List Patient Active Problem List   Diagnosis  Date Noted  . Pain and swelling of right lower extremity 01/02/2017  . Parkinson's disease ([place]) 01/02/2017    Alanson Puls, PT DPT 08/09/2017, 2:58 PM  [place] MAIN [facility] SERVICES [address] [place], [place], [address] Phone: [phone]   Fax:  [phone]  Name: Inda Mcglothen MRN: [id number] Date of Birth: 1940/04/06

## 2017-08-13 ENCOUNTER — Ambulatory Visit: Payer: Medicare Other | Attending: Family Medicine | Admitting: Physical Therapy

## 2017-08-13 ENCOUNTER — Ambulatory Visit: Payer: Medicare Other | Admitting: Occupational Therapy

## 2017-08-13 ENCOUNTER — Encounter: Payer: Self-pay | Admitting: Physical Therapy

## 2017-08-13 ENCOUNTER — Encounter: Payer: Self-pay | Admitting: Occupational Therapy

## 2017-08-13 DIAGNOSIS — R490 Dysphonia: Secondary | ICD-10-CM | POA: Insufficient documentation

## 2017-08-13 DIAGNOSIS — M7989 Other specified soft tissue disorders: Secondary | ICD-10-CM | POA: Insufficient documentation

## 2017-08-13 DIAGNOSIS — R262 Difficulty in walking, not elsewhere classified: Secondary | ICD-10-CM | POA: Insufficient documentation

## 2017-08-13 DIAGNOSIS — G20 Parkinson's disease: Secondary | ICD-10-CM | POA: Diagnosis present

## 2017-08-13 DIAGNOSIS — M79604 Pain in right leg: Secondary | ICD-10-CM | POA: Diagnosis present

## 2017-08-13 DIAGNOSIS — M6281 Muscle weakness (generalized): Secondary | ICD-10-CM

## 2017-08-13 DIAGNOSIS — R278 Other lack of coordination: Secondary | ICD-10-CM | POA: Insufficient documentation

## 2017-08-13 NOTE — Therapy (Signed)
[place] MAIN [facility] SERVICES [address] [place], [place], [address] Phone: [phone]   Fax:  [phone]  Occupational Therapy Treatment  Patient Details  Name: Kelsey Owens MRN: [id number] Date of Birth: May 16, 1940 Referring Provider: Jennelle Human   Encounter Date: 08/13/2017  OT End of Session - 08/13/17 1443    Visit Number  4    Number of Visits  24    Date for OT Re-Evaluation  10/10/17    Authorization Time Period  Visit 4 of 10 for progress report period starting 07/18/2017    OT Start Time  1434    OT Stop Time  1515    OT Time Calculation (min)  41 min    Activity Tolerance  Patient tolerated treatment well;Treatment limited secondary to agitation;Patient limited by lethargy;Patient limited by pain    Behavior During Therapy  [facility] for tasks assessed/performed       Past Medical History:  Diagnosis Date  . Arthritis   . GERD (gastroesophageal reflux disease)   . Parkinson disease [facility])     Past Surgical History:  Procedure Laterality Date  . ABDOMINAL HYSTERECTOMY    . CHOLECYSTECTOMY    . DEEP BRAIN STIMULATOR PLACEMENT    . HEMORROIDECTOMY      There were no vitals filed for this visit.  Subjective Assessment - 08/13/17 1442    Patient is accompained by:  Family member    Pertinent History  Pt. is a 77 y.o. female with a history of Parkinsons Disease. Pt. has a Deep Brain Stimulator, and has recently had it adjusted. Pt. has had multiple falls, and has a history of multiple wrist fractures.     Currently in Pain?  Yes      OT TREATMENT    Neuro muscular re-education:  Pt. worked on grasping, flipping and stacking 2" large pegs on the [employer] board placed at a tabletop surface. Pt. Requires increased time,  verbal cues, and visual demonstration for proper technique.                        OT Education - 08/13/17 1443    Education Details  BUE functioning    Person(s) Educated   Patient    Methods  Explanation    Comprehension  Verbalized understanding          OT Long Term Goals - 07/18/17 1739      OT LONG TERM GOAL #1   Title  Pt. will improve BUE strength by 2 muscle grades to improve ADL, and IADL functioning.    Baseline  Eval: Limited BUE strength: Refer to flowsheet for detailed objective measurements.    Time  12    Period  Weeks    Status  New    Target Date  10/10/17      OT LONG TERM GOAL #2   Title  Pt. will increase Left grip strength by 5# to be able to open jars, and containers.    Baseline  Eval: Limited grip strength    Time  12    Period  Weeks    Status  New    Target Date  10/10/17      OT LONG TERM GOAL #3   Title  Pt. will improve left hand [facility] skills  to be able to take items (cards/change) out of a wallet.    Baseline  Eval: limited [facility] skills    Time  12  Period  Weeks    Status  New    Target Date  10/10/17      OT LONG TERM GOAL #4   Title  Pt. will independently write one sentence efficiently with 75% legibility.    Baseline  Eval: 25% legibility for name only.    Time  12    Period  Weeks    Status  New    Target Date  10/10/17      OT LONG TERM GOAL #5   Title  Pt. independently demonstrate work simplification strategies, and compensatory startegies for safe IADL tasks/home management tasks.       Baseline  Eval: Pt. has difficulty    Time  12    Period  Weeks    Status  New    Target Date  10/10/17      OT LONG TERM GOAL #6   Title  Pt. will independently demonstrate work simplification strategies, and compensatory startegies within the kitchen.    Baseline  Eval: Pt. is unable    Time  12    Period  Weeks    Status  New    Target Date  10/10/17            Plan - 08/13/17 1444    Occupational Profile and client history currently impacting functional performance  Pt. resides with her husband who is a Theme park manager. Pt. has grown children.    Occupational performance deficits (Please refer to  evaluation for details):  ADL's;IADL's    Current Impairments/barriers affecting progress:  Positive Barriers: age, motivation, family support. Negative Barriers: multiple comorbidities, history of multiple falls.    OT Frequency  2x / week    OT Duration  12 weeks    OT Treatment/Interventions  Self-care/ADL training;Electrical Stimulation;Neuromuscular education;Therapeutic exercise;DME and/or AE instruction;Visual/perceptual remediation/compensation;Patient/family education;Therapeutic activities    Clinical Decision Making  Multiple treatment options, significant modification of task necessary    Consulted and Agree with Plan of Care  Patient       Patient will benefit from skilled therapeutic intervention in order to improve the following deficits and impairments:  Abnormal gait, Decreased cognition, Pain, Decreased strength, Impaired UE functional use, Decreased balance, Decreased activity tolerance  Visit Diagnosis: Muscle weakness (generalized)  Other lack of coordination    Problem List Patient Active Problem List   Diagnosis Date Noted  . Pain and swelling of right lower extremity 01/02/2017  . Parkinson's disease ([place]) 01/02/2017    Harrel Carina, MS, MS, OTR/L 08/13/2017, 2:49 PM  [place] MAIN [facility] SERVICES [address] [place], [place], [address] Phone: [phone]   Fax:  [phone]  Name: Kelsey Owens MRN: [id number] Date of Birth: [birth year]

## 2017-08-13 NOTE — Therapy (Signed)
[place] MAIN [facility] SERVICES [address] [place], [place], [address] Phone: [phone]   Fax:  [phone]  Physical Therapy Treatment  Patient Details  Name: Kelsey Owens MRN: [id number] Date of Birth: 10/21/1940 Referring Provider: Jennelle Human   Encounter Date: 08/13/2017  PT End of Session - 08/13/17 1415    Visit Number  7    Number of Visits  17    Date for PT Re-Evaluation  09/10/17    PT Start Time  0145    PT Stop Time  0230    PT Time Calculation (min)  45 min    Equipment Utilized During Treatment  Gait belt    Activity Tolerance  Patient tolerated treatment well    Behavior During Therapy  [facility] for tasks assessed/performed       Past Medical History:  Diagnosis Date  . Arthritis   . GERD (gastroesophageal reflux disease)   . Parkinson disease [facility])     Past Surgical History:  Procedure Laterality Date  . ABDOMINAL HYSTERECTOMY    . CHOLECYSTECTOMY    . DEEP BRAIN STIMULATOR PLACEMENT    . HEMORROIDECTOMY      There were no vitals filed for this visit.  Subjective Assessment - 08/13/17 1357    Subjective  Patient is having pain 4/10 in right shoulder, she had another shot last friday.     Pertinent History  Patient has had parkinsons disease for the last 20 years. Her mobility has been getting worse after she had a left radius fx.  She was walking with RW in 10/18.  Patient has worseing loss of balance beginning 10/18.  She had HHPT 2 x week for 2 months beginining after hospitilization in Dec 2018.     Patient Stated Goals  Patient wants to be able to walk better and not fall as often    Currently in Pain?  Yes    Pain Score  4     Pain Location  Shoulder    Pain Orientation  Right    Pain Descriptors / Indicators  Aching    Pain Type  Acute pain    Pain Onset  More than a month ago        Treatment:  Nu-step x 5 mins BUE and BLE  Transfer training from wc to nu-step and nu-step to wc with min  assist and VC for sequencing and safety, hand placement. Patient has poor initial static standing balance that needs min assist to maintain static position.   Transfer from chair to stand in parallel bars  5 reps with rest period and VC for sequencing and UE support  Gait training in parallel bars with min assist and RLE  knee flex and heavy use of BUE support 20 feet x 3, 20 feet x 2 with poor motor control for RLE placement  Standing static standing with min assist and use of parallel bars for 5 mins and then fatigue and rest needed x 2 reps                         PT Education - 08/13/17 1359    Education Details  Patient progressed HEP    Person(s) Educated  Patient    Methods  Explanation;Demonstration    Comprehension  Verbalized understanding       PT Short Term Goals - 07/16/17 1441      PT SHORT TERM GOAL #1   Title  Patient will be independent in home exercise program to improve strength/mobility for better functional independence with ADLs.    Time  4    Period  Weeks    Status  New    Target Date  08/13/17      PT SHORT TERM GOAL #2   Title  Patient will perofrm sit to stand with correct hand placement and without support on the back of the chair with RW  and SBA.     Baseline  Patient needs max assist and needs max assist for correct hand placement.    Time  4    Period  Weeks    Status  New    Target Date  08/13/17      PT SHORT TERM GOAL #3   Title  Patient will stand for 1 minute with UE support to be able to perform safe transfers.     Baseline  Patient can stand for 10 seconds    Time  4    Period  Weeks    Status  New    Target Date  08/13/17        PT Long Term Goals - 07/16/17 1515      PT LONG TERM GOAL #1   Title  Patient will be able to ambulate in parallel bars x 10 feet with min assist.    Baseline  unable to ambulate with AD    Time  8    Period  Weeks    Status  New    Target Date  09/10/17      PT LONG TERM GOAL  #2   Title  Patient will ambulate short distances ,25 feet, with RW and mod assist.     Baseline  unable to ambulate with AD    Time  8    Period  Weeks    Status  New    Target Date  09/10/17      PT LONG TERM GOAL #3   Title  Patient will be able to ascend 1 step with min assist with LARD.    Baseline  max assist with HHA    Time  8    Period  Weeks    Status  New    Target Date  09/10/17            Plan - 08/13/17 1427    Clinical Impression Statement  Patient presents with decreased gait speed, decreased balance, and decreased BLE strength. Patient's main complaint is BLE weakness and inability to participate in desired activities such as standing and walking and transferring. . Patient wants to improve his balance and ability to ambulate  safely. Patient will benefit from skilled PT in order to increase gait speed, increase BLE strength, and improve dynamic standing balance to decrease risk for falls and enable patient to participate in desired activities.    Rehab Potential  Good    PT Frequency  2x / week    PT Duration  8 weeks    PT Treatment/Interventions  Therapeutic exercise;Therapeutic activities;Functional mobility training;Stair training;Balance training;Neuromuscular re-education;Patient/family education;Aquatic Therapy    PT Next Visit Plan  mobility training, strengthening, balanace training    PT Home Exercise Plan  LAQ, hip flex    Consulted and Agree with Plan of Care  Patient;Family member/caregiver       Patient will benefit from skilled therapeutic intervention in order to improve the following deficits and impairments:  Decreased balance, Decreased endurance, Decreased mobility, Decreased safety  awareness, Decreased strength, Decreased activity tolerance  Visit Diagnosis: Pain and swelling of right lower extremity  Parkinson's disease ([facility])  Muscle weakness (generalized)  Other lack of coordination  Difficulty in walking, not elsewhere  classified  Dysphonia     Problem List Patient Active Problem List   Diagnosis Date Noted  . Pain and swelling of right lower extremity 01/02/2017  . Parkinson's disease ([place]) 01/02/2017    Alanson Puls, PT DPT 08/13/2017, 2:35 PM  [place] MAIN [facility] SERVICES [address] [place], [place], [address] Phone: [phone]   Fax:  [phone]  Name: Irmalee Riemenschneider MRN: [id number] Date of Birth: 1940-12-07

## 2017-08-20 ENCOUNTER — Ambulatory Visit: Payer: Medicare Other | Admitting: Occupational Therapy

## 2017-08-20 ENCOUNTER — Ambulatory Visit: Payer: Medicare Other

## 2017-08-20 VITALS — BP 141/63 | HR 91

## 2017-08-20 DIAGNOSIS — M6281 Muscle weakness (generalized): Secondary | ICD-10-CM

## 2017-08-20 DIAGNOSIS — R262 Difficulty in walking, not elsewhere classified: Secondary | ICD-10-CM

## 2017-08-20 DIAGNOSIS — M79604 Pain in right leg: Secondary | ICD-10-CM | POA: Diagnosis not present

## 2017-08-20 NOTE — Therapy (Signed)
[place] MAIN [facility] SERVICES [address] [place], [place], [address] Phone: [phone]   Fax:  [phone]  Physical Therapy Treatment  Patient Details  Name: Kelsey Owens MRN: [id number] Date of Birth: 1940-11-01 Referring Provider: Jennelle Human   Encounter Date: 08/20/2017  PT End of Session - 08/20/17 1354    Visit Number  8    Number of Visits  17    Date for PT Re-Evaluation  09/10/17    PT Start Time  7371    PT Stop Time  1428    PT Time Calculation (min)  31 min    Equipment Utilized During Treatment  Gait belt    Activity Tolerance  Patient tolerated treatment well    Behavior During Therapy  [facility] for tasks assessed/performed       Past Medical History:  Diagnosis Date  . Arthritis   . GERD (gastroesophageal reflux disease)   . Parkinson disease [facility])     Past Surgical History:  Procedure Laterality Date  . ABDOMINAL HYSTERECTOMY    . CHOLECYSTECTOMY    . DEEP BRAIN STIMULATOR PLACEMENT    . HEMORROIDECTOMY      Vitals:   08/20/17 1400  BP: (!) 141/63  Pulse: 91  SpO2: 99%    Subjective Assessment - 08/20/17 1354    Subjective  Patient is having pain 5/10 in right shoulder and 4/10 in the left shoulder. She reports that the shot helped for "a day or two" and then it "came back." No specific questions or concerns at this time.     Pertinent History  Patient has had parkinsons disease for the last 20 years. Her mobility has been getting worse after she had a left radius fx.  She was walking with RW in 10/18.  Patient has worseing loss of balance beginning 10/18.  She had HHPT 2 x week for 2 months beginining after hospitilization in Dec 2018.     Patient Stated Goals  Patient wants to be able to walk better and not fall as often    Currently in Pain?  Yes    Pain Score  5     Pain Location  Shoulder    Pain Orientation  Right    Pain Descriptors / Indicators  Aching    Pain Type  Acute pain    Pain Onset   More than a month ago    Pain Frequency  Intermittent          TREATMENT  Ther-ex  Sit to stand from regular height chair with 2 Airex pads on the seat x 10, extensive transfer training required with verbal and tactile cues as well as external cues for forward trunk leaning and anterior weight shifting. Pt continues to struggle throughout following commands and shifting her weight forward; Attempted static marching with no UE support and then single UE support but pt with recurrent LE buckling and posterior LOB so discontinued; Attempted toe taps to 2" Airex with no UE support and then single UE support however pt with recurrent LE buckling, posterior LOB, and difficulty with L foot placement Seated marches x 15 bilateral; Seated clams with manual resistance x 15; Seated adductor squeeze with manual resistance x 15; Quantum leg press 90# 2 x 15;  Gait Training Gait training in parallel bars with min assist intermittently for balance. Cues for increased step length bilaterally. Pt initially with BUE support but progressed to no UE for majority of ambulation distance. Decreased toe  to floor clearance noted during ambulation.                       PT Education - 08/20/17 1354    Education Details  exercise form/technique    Person(s) Educated  Patient    Methods  Explanation    Comprehension  Verbalized understanding       PT Short Term Goals - 07/16/17 1441      PT SHORT TERM GOAL #1   Title  Patient will be independent in home exercise program to improve strength/mobility for better functional independence with ADLs.    Time  4    Period  Weeks    Status  New    Target Date  08/13/17      PT SHORT TERM GOAL #2   Title  Patient will perofrm sit to stand with correct hand placement and without support on the back of the chair with RW  and SBA.     Baseline  Patient needs max assist and needs max assist for correct hand placement.    Time  4    Period   Weeks    Status  New    Target Date  08/13/17      PT SHORT TERM GOAL #3   Title  Patient will stand for 1 minute with UE support to be able to perform safe transfers.     Baseline  Patient can stand for 10 seconds    Time  4    Period  Weeks    Status  New    Target Date  08/13/17        PT Long Term Goals - 07/16/17 1515      PT LONG TERM GOAL #1   Title  Patient will be able to ambulate in parallel bars x 10 feet with min assist.    Baseline  unable to ambulate with AD    Time  8    Period  Weeks    Status  New    Target Date  09/10/17      PT LONG TERM GOAL #2   Title  Patient will ambulate short distances ,25 feet, with RW and mod assist.     Baseline  unable to ambulate with AD    Time  8    Period  Weeks    Status  New    Target Date  09/10/17      PT LONG TERM GOAL #3   Title  Patient will be able to ascend 1 step with min assist with LARD.    Baseline  max assist with HHA    Time  8    Period  Weeks    Status  New    Target Date  09/10/17            Plan - 08/20/17 1355    Clinical Impression Statement  Pt arrived late so her appointment was shorter than normal. Patient continues to demonstrate BLE weakness and poor static and dynamic balance. She struggles to correct for weight shifting outside of her base of support. She is able to perform leg press today without any increase in knee pain. Patient showed poor safety technique and requires repeated cueing. Patient will continue to benefit from skilled physical therapy to improve LE strength and balance to improve quality of life.     Rehab Potential  Good    PT Frequency  2x / week  PT Duration  8 weeks    PT Treatment/Interventions  Therapeutic exercise;Therapeutic activities;Functional mobility training;Stair training;Balance training;Neuromuscular re-education;Patient/family education;Aquatic Therapy    PT Next Visit Plan  mobility training, strengthening, balanace training    PT Home Exercise  Plan  LAQ, hip flex    Consulted and Agree with Plan of Care  Patient;Family member/caregiver       Patient will benefit from skilled therapeutic intervention in order to improve the following deficits and impairments:  Decreased balance, Decreased endurance, Decreased mobility, Decreased safety awareness, Decreased strength, Decreased activity tolerance  Visit Diagnosis: Muscle weakness (generalized)  Difficulty in walking, not elsewhere classified     Problem List Patient Active Problem List   Diagnosis Date Noted  . Pain and swelling of right lower extremity 01/02/2017  . Parkinson's disease ([place]) 01/02/2017    Jentri Aye 08/21/2017, 1:23 PM  [place] MAIN [facility] SERVICES [address] [place], [place], [address] Phone: [phone]   Fax:  [phone]  Name: Kelsey Owens MRN: [id number] Date of Birth: 15-Nov-1940

## 2017-08-22 ENCOUNTER — Ambulatory Visit: Payer: Medicare Other | Admitting: Physical Therapy

## 2017-08-22 ENCOUNTER — Ambulatory Visit: Payer: Medicare Other | Admitting: Occupational Therapy

## 2017-08-22 ENCOUNTER — Encounter: Payer: Self-pay | Admitting: Occupational Therapy

## 2017-08-22 ENCOUNTER — Encounter: Payer: Self-pay | Admitting: Physical Therapy

## 2017-08-22 DIAGNOSIS — R262 Difficulty in walking, not elsewhere classified: Secondary | ICD-10-CM

## 2017-08-22 DIAGNOSIS — M79604 Pain in right leg: Secondary | ICD-10-CM | POA: Diagnosis not present

## 2017-08-22 DIAGNOSIS — R278 Other lack of coordination: Secondary | ICD-10-CM

## 2017-08-22 DIAGNOSIS — M6281 Muscle weakness (generalized): Secondary | ICD-10-CM

## 2017-08-22 DIAGNOSIS — G20 Parkinson's disease: Secondary | ICD-10-CM

## 2017-08-22 DIAGNOSIS — M7989 Other specified soft tissue disorders: Secondary | ICD-10-CM

## 2017-08-22 NOTE — Therapy (Signed)
[place] MAIN [facility] SERVICES [address] [place], [place], [address] Phone: [phone]   Fax:  [phone]  Occupational Therapy Treatment  Patient Details  Name: Kelsey Owens MRN: [id number] Date of Birth: [birth year] Referring Provider: Jennelle Human   Encounter Date: 08/22/2017  OT End of Session - 08/22/17 1442    Visit Number  5    Number of Visits  24    Date for OT Re-Evaluation  10/10/17    Authorization Time Period  Visit 4 of 10 for progress report period starting 07/18/2017    OT Start Time  1430    OT Stop Time  1515    OT Time Calculation (min)  45 min    Activity Tolerance  Patient tolerated treatment well;Treatment limited secondary to agitation;Patient limited by lethargy;Patient limited by pain    Behavior During Therapy  [facility] for tasks assessed/performed       Past Medical History:  Diagnosis Date  . Arthritis   . GERD (gastroesophageal reflux disease)   . Parkinson disease [facility])     Past Surgical History:  Procedure Laterality Date  . ABDOMINAL HYSTERECTOMY    . CHOLECYSTECTOMY    . DEEP BRAIN STIMULATOR PLACEMENT    . HEMORROIDECTOMY      There were no vitals filed for this visit.  Subjective Assessment - 08/22/17 1434    Subjective   Pt. reports she is doing well.     Patient is accompained by:  Family member    Pertinent History  Pt. is a 77 y.o. female with a history of Parkinsons Disease. Pt. has a Deep Brain Stimulator, and has recently had it adjusted. Pt. has had multiple falls, and has a history of multiple wrist fractures.     Currently in Pain?  Yes       OT TREATMENT    Neuro muscular re-education:  Pt. worked on [facility], and Special educational needs teacher. Pt. Worked on grasping 1/4" objects, and placing storing them in the palm of her hand, and translatory skills moving them to the tip of her 2nd digit, and thumb. Pt. performed [facility] tasks using the Grooved pegboard. Pt. worked on grasping the grooved  pegs from a horizontal position, and moving the pegs to a vertical position in the hand to prepare for placing them in the grooved slot. Pt. worked on manipulating knots of varying sizes.                          OT Education - 08/22/17 1438    Education Details  BUE functioning    Person(s) Educated  Patient    Methods  Explanation    Comprehension  Verbalized understanding          OT Long Term Goals - 07/18/17 1739      OT LONG TERM GOAL #1   Title  Pt. will improve BUE strength by 2 muscle grades to improve ADL, and IADL functioning.    Baseline  Eval: Limited BUE strength: Refer to flowsheet for detailed objective measurements.    Time  12    Period  Weeks    Status  New    Target Date  10/10/17      OT LONG TERM GOAL #2   Title  Pt. will increase Left grip strength by 5# to be able to open jars, and containers.    Baseline  Eval: Limited grip strength    Time  12  Period  Weeks    Status  New    Target Date  10/10/17      OT LONG TERM GOAL #3   Title  Pt. will improve left hand [facility] skills  to be able to take items (cards/change) out of a wallet.    Baseline  Eval: limited [facility] skills    Time  12    Period  Weeks    Status  New    Target Date  10/10/17      OT LONG TERM GOAL #4   Title  Pt. will independently write one sentence efficiently with 75% legibility.    Baseline  Eval: 25% legibility for name only.    Time  12    Period  Weeks    Status  New    Target Date  10/10/17      OT LONG TERM GOAL #5   Title  Pt. independently demonstrate work simplification strategies, and compensatory startegies for safe IADL tasks/home management tasks.       Baseline  Eval: Pt. has difficulty    Time  12    Period  Weeks    Status  New    Target Date  10/10/17      OT LONG TERM GOAL #6   Title  Pt. will independently demonstrate work simplification strategies, and compensatory startegies within the kitchen.    Baseline  Eval: Pt. is unable     Time  12    Period  Weeks    Status  New    Target Date  10/10/17            Plan - 08/22/17 1443    Clinical Impression Statement  Pt. reports that things are getting easier at home. Pt. continues to present with impaired fine motor coordination skills. Pt. continues to work on improving [facility], and dexterity skills in order improve functional engagement in daily ADL, and IADL tasks.     Occupational Profile and client history currently impacting functional performance  Pt. resides with her husband who is a Theme park manager. Pt. has grown children.    Occupational performance deficits (Please refer to evaluation for details):  ADL's;IADL's    Current Impairments/barriers affecting progress:  Positive Barriers: age, motivation, family support. Negative Barriers: multiple comorbidities, history of multiple falls.    OT Frequency  2x / week    OT Duration  12 weeks    OT Treatment/Interventions  Self-care/ADL training;Electrical Stimulation;Neuromuscular education;Therapeutic exercise;DME and/or AE instruction;Visual/perceptual remediation/compensation;Patient/family education;Therapeutic activities    Clinical Decision Making  Multiple treatment options, significant modification of task necessary    Consulted and Agree with Plan of Care  Patient       Patient will benefit from skilled therapeutic intervention in order to improve the following deficits and impairments:  Abnormal gait, Decreased cognition, Pain, Decreased strength, Impaired UE functional use, Decreased balance, Decreased activity tolerance  Visit Diagnosis: Muscle weakness (generalized)  Other lack of coordination    Problem List Patient Active Problem List   Diagnosis Date Noted  . Pain and swelling of right lower extremity 01/02/2017  . Parkinson's disease ([place]) 01/02/2017    Kelsey Owens 08/22/2017, 2:56 PM  [place] MAIN [facility] SERVICES [address] [place], [place],  [address] Phone: [phone]   Fax:  [phone]  Name: Ameya Vowell MRN: [id number] Date of Birth: [birth year]

## 2017-08-22 NOTE — Therapy (Signed)
[place] MAIN [facility] SERVICES [address] [place], [place], [address] Phone: [phone]   Fax:  [phone]  Physical Therapy Treatment  Patient Details  Name: Kelsey Owens MRN: [id number] Date of Birth: October 17, 1940 Referring Provider: Jennelle Human   Encounter Date: 08/22/2017  PT End of Session - 08/22/17 1356    Visit Number  9    Number of Visits  17    Date for PT Re-Evaluation  09/10/17    PT Start Time  0145    PT Stop Time  0230    PT Time Calculation (min)  45 min    Equipment Utilized During Treatment  Gait belt    Activity Tolerance  Patient tolerated treatment well    Behavior During Therapy  [facility] for tasks assessed/performed       Past Medical History:  Diagnosis Date  . Arthritis   . GERD (gastroesophageal reflux disease)   . Parkinson disease [facility])     Past Surgical History:  Procedure Laterality Date  . ABDOMINAL HYSTERECTOMY    . CHOLECYSTECTOMY    . DEEP BRAIN STIMULATOR PLACEMENT    . HEMORROIDECTOMY      There were no vitals filed for this visit.  Subjective Assessment - 08/22/17 1354    Subjective  Patient is having pain 4/10 right shoulder.   No specific questions or concerns at this time.     Pertinent History  Patient has had parkinsons disease for the last 20 years. Her mobility has been getting worse after she had a left radius fx.  She was walking with RW in 10/18.  Patient has worseing loss of balance beginning 10/18.  She had HHPT 2 x week for 2 months beginining after hospitilization in Dec 2018.     Patient Stated Goals  Patient wants to be able to walk better and not fall as often    Pain Score  4     Pain Location  Shoulder    Pain Orientation  Right    Pain Descriptors / Indicators  Aching    Pain Type  Chronic pain    Pain Onset  More than a month ago       Treatment: Gait training with rollator 100 feet with min assist , shuffled feet and flexed knees and hips  Transfer training  sit to stand from mat to rollator with min assist and vc for sequencing and hand placement. 3 x 2 sets   Therapeutic exercise:  Bridges x 15 x 2  hooklying marching  2   hooklying ER/abd x 20 with YTB   SAQ with 3 lbs x 20 BLE  SLR x 15 BLE   CGA and Min to mod verbal cues used throughout with increased in postural sway and LOB most seen with narrow base of support and while on uneven surfaces. Continues to have balance deficits typical with diagnosis. Patient performs intermediate level exercises without pain behaviors and needs verbal cuing for postural alignment and head positioning                        PT Education - 08/22/17 1355    Education Details  HEP    Person(s) Educated  Patient    Methods  Explanation    Comprehension  Verbalized understanding;Returned demonstration       PT Short Term Goals - 07/16/17 1441      PT SHORT TERM GOAL #1   Title  Patient will be independent in home exercise program to improve strength/mobility for better functional independence with ADLs.    Time  4    Period  Weeks    Status  New    Target Date  08/13/17      PT SHORT TERM GOAL #2   Title  Patient will perofrm sit to stand with correct hand placement and without support on the back of the chair with RW  and SBA.     Baseline  Patient needs max assist and needs max assist for correct hand placement.    Time  4    Period  Weeks    Status  New    Target Date  08/13/17      PT SHORT TERM GOAL #3   Title  Patient will stand for 1 minute with UE support to be able to perform safe transfers.     Baseline  Patient can stand for 10 seconds    Time  4    Period  Weeks    Status  New    Target Date  08/13/17        PT Long Term Goals - 08/22/17 1357      PT LONG TERM GOAL #1   Title  Patient will be able to ambulate in parallel bars x 10 feet with min assist.    Baseline  unable to ambulate with AD    Time  8    Period  Weeks    Status  Partially Met     Target Date  09/10/17      PT LONG TERM GOAL #2   Title  Patient will ambulate short distances ,25 feet, with RW and mod assist.     Baseline  : 08/22/17 patient is ambulating 15 feet with RW    Time  8    Period  Weeks    Status  Partially Met    Target Date  09/10/17      PT LONG TERM GOAL #3   Title  Patient will be able to ascend 1 step with min assist with LARD.    Baseline  max assist with HHA    Time  8    Period  Weeks    Status  Partially Met    Target Date  09/10/17            Plan - 08/22/17 1356    Clinical Impression Statement  Patient demonstrates BLE weakness, and impaired transfers and lack of static and dynamic standing balance that is causing difficulty with sit to stand transfers and standing activities. Patient still fatigues quickly to due very weak LE's. Patient showed poor safety technique and is able to generate good form after heavy cueing. Patient will continue to benefit from skilled physical therapy to improve LE strength and balance to improve quality of life.     Rehab Potential  Good    PT Frequency  2x / week    PT Duration  8 weeks    PT Treatment/Interventions  Therapeutic exercise;Therapeutic activities;Functional mobility training;Stair training;Balance training;Neuromuscular re-education;Patient/family education;Aquatic Therapy    PT Next Visit Plan  mobility training, strengthening, balanace training    PT Home Exercise Plan  LAQ, hip flex    Consulted and Agree with Plan of Care  Patient;Family member/caregiver       Patient will benefit from skilled therapeutic intervention in order to improve the following deficits and impairments:  Decreased balance, Decreased endurance, Decreased mobility, Decreased  safety awareness, Decreased strength, Decreased activity tolerance  Visit Diagnosis: Muscle weakness (generalized)  Difficulty in walking, not elsewhere classified  Other lack of coordination  Pain and swelling of right lower  extremity  Parkinson's disease [facility])     Problem List Patient Active Problem List   Diagnosis Date Noted  . Pain and swelling of right lower extremity 01/02/2017  . Parkinson's disease ([place]) 01/02/2017    Kelsey Owens, [place]. PT DPT 08/22/2017, 1:59 PM  [place] MAIN [facility] SERVICES [address] [place], [place], [address] Phone: [phone]   Fax:  [phone]  Name: Kelsey Owens MRN: [id number] Date of Birth: 07-Sep-1940

## 2017-08-27 ENCOUNTER — Ambulatory Visit: Payer: Medicare Other | Admitting: Physical Therapy

## 2017-08-27 ENCOUNTER — Ambulatory Visit: Payer: Medicare Other | Admitting: Occupational Therapy

## 2017-08-27 ENCOUNTER — Encounter: Payer: Medicare Other | Admitting: Occupational Therapy

## 2017-08-29 ENCOUNTER — Encounter: Payer: Self-pay | Admitting: Occupational Therapy

## 2017-08-29 ENCOUNTER — Ambulatory Visit: Payer: Medicare Other | Admitting: Physical Therapy

## 2017-08-29 ENCOUNTER — Encounter: Payer: Self-pay | Admitting: Physical Therapy

## 2017-08-29 ENCOUNTER — Ambulatory Visit: Payer: Medicare Other | Admitting: Occupational Therapy

## 2017-08-29 DIAGNOSIS — M79604 Pain in right leg: Secondary | ICD-10-CM | POA: Diagnosis not present

## 2017-08-29 DIAGNOSIS — R278 Other lack of coordination: Secondary | ICD-10-CM

## 2017-08-29 DIAGNOSIS — M6281 Muscle weakness (generalized): Secondary | ICD-10-CM

## 2017-08-29 DIAGNOSIS — R262 Difficulty in walking, not elsewhere classified: Secondary | ICD-10-CM

## 2017-08-29 DIAGNOSIS — R490 Dysphonia: Secondary | ICD-10-CM

## 2017-08-29 DIAGNOSIS — G20 Parkinson's disease: Secondary | ICD-10-CM

## 2017-08-29 DIAGNOSIS — M7989 Other specified soft tissue disorders: Secondary | ICD-10-CM

## 2017-08-29 NOTE — Therapy (Signed)
[place] MAIN [facility] SERVICES [address] [place], [place], [address] Phone: [phone]   Fax:  [phone]  Occupational Therapy Treatment  Patient Details  Name: Kelsey Owens MRN: [id number] Date of Birth: Dec 07, 1940 Referring Provider: Jennelle Human   Encounter Date: 08/29/2017  OT End of Session - 08/29/17 1447    Visit Number  6    Number of Visits  24    Date for OT Re-Evaluation  10/10/17    Authorization Time Period  Visit 5 of 10 for progress report period starting 07/18/2017    OT Start Time  1430    OT Stop Time  1515    OT Time Calculation (min)  45 min    Activity Tolerance  Patient tolerated treatment well;Treatment limited secondary to agitation;Patient limited by lethargy;Patient limited by pain    Behavior During Therapy  [facility] for tasks assessed/performed       Past Medical History:  Diagnosis Date  . Arthritis   . GERD (gastroesophageal reflux disease)   . Parkinson disease [facility])     Past Surgical History:  Procedure Laterality Date  . ABDOMINAL HYSTERECTOMY    . CHOLECYSTECTOMY    . DEEP BRAIN STIMULATOR PLACEMENT    . HEMORROIDECTOMY      There were no vitals filed for this visit.  Subjective Assessment - 08/29/17 1442    Subjective   Pt. continues to work on improving UE strength, and [facility] skill.     Patient is accompained by:  Family member    Pertinent History  Pt. is a 77 y.o. female with a history of Parkinsons Disease. Pt. has a Deep Brain Stimulator, and has recently had it adjusted. Pt. has had multiple falls, and has a history of multiple wrist fractures.     Currently in Pain?  Yes      OT TREATMENT    Neuro muscular re-education:  Pt. worked on [facility], and prehension skills grasping coins from a tabletop surface, placing them into a resistive container, and pushing them through the slot while isolating her 2nd digit. Pt. worked on grasping 1" resistive cubes alternating thumb opposition to  the tip of the 2nd through 5th digits while the board is placed at a vertical angle. Pt. worked on pressing the cubes back into place while isolating her 2nd digit.  Therapeutic Exercise:  Pt. worked on pinch strengthening in the left hand for lateral, and 3pt. pinch using yellow, red, green, and blue resistive clips. Pt. worked on placing the clips at various vertical and horizontal angles. Tactile and verbal cues were required for motor planning, and eliciting the desired movement.                       OT Education - 08/29/17 1446    Education Details  BUE functioning    Person(s) Educated  Patient    Methods  Explanation    Comprehension  Verbalized understanding          OT Long Term Goals - 07/18/17 1739      OT LONG TERM GOAL #1   Title  Pt. will improve BUE strength by 2 muscle grades to improve ADL, and IADL functioning.    Baseline  Eval: Limited BUE strength: Refer to flowsheet for detailed objective measurements.    Time  12    Period  Weeks    Status  New    Target Date  10/10/17  OT LONG TERM GOAL #2   Title  Pt. will increase Left grip strength by 5# to be able to open jars, and containers.    Baseline  Eval: Limited grip strength    Time  12    Period  Weeks    Status  New    Target Date  10/10/17      OT LONG TERM GOAL #3   Title  Pt. will improve left hand [facility] skills  to be able to take items (cards/change) out of a wallet.    Baseline  Eval: limited [facility] skills    Time  12    Period  Weeks    Status  New    Target Date  10/10/17      OT LONG TERM GOAL #4   Title  Pt. will independently write one sentence efficiently with 75% legibility.    Baseline  Eval: 25% legibility for name only.    Time  12    Period  Weeks    Status  New    Target Date  10/10/17      OT LONG TERM GOAL #5   Title  Pt. independently demonstrate work simplification strategies, and compensatory startegies for safe IADL tasks/home management tasks.        Baseline  Eval: Pt. has difficulty    Time  12    Period  Weeks    Status  New    Target Date  10/10/17      OT LONG TERM GOAL #6   Title  Pt. will independently demonstrate work simplification strategies, and compensatory startegies within the kitchen.    Baseline  Eval: Pt. is unable    Time  12    Period  Weeks    Status  New    Target Date  10/10/17            Plan - 08/29/17 1450    Clinical Impression Statement  Pt. reports that she feels like she is improving overall with UE functioning. Pt. continues to present with limited UE strength, and [facility] coordination skills. Pt. requires visual cues, verbal cues, as well as visual demonstration for motor planning skills.    Occupational Profile and client history currently impacting functional performance  Pt. resides with her husband who is a Theme park manager. Pt. has grown children.    Occupational performance deficits (Please refer to evaluation for details):  ADL's;IADL's    Current Impairments/barriers affecting progress:  Positive Barriers: age, motivation, family support. Negative Barriers: multiple comorbidities, history of multiple falls.    OT Frequency  2x / week    OT Duration  12 weeks    OT Treatment/Interventions  Self-care/ADL training;Electrical Stimulation;Neuromuscular education;Therapeutic exercise;DME and/or AE instruction;Visual/perceptual remediation/compensation;Patient/family education;Therapeutic activities    Clinical Decision Making  Multiple treatment options, significant modification of task necessary    Consulted and Agree with Plan of Care  Patient       Patient will benefit from skilled therapeutic intervention in order to improve the following deficits and impairments:  Abnormal gait, Decreased cognition, Pain, Decreased strength, Impaired UE functional use, Decreased balance, Decreased activity tolerance  Visit Diagnosis: Muscle weakness (generalized)  Other lack of coordination    Problem  List Patient Active Problem List   Diagnosis Date Noted  . Pain and swelling of right lower extremity 01/02/2017  . Parkinson's disease ([place]) 01/02/2017    Harrel Carina, MS, OTR/L 08/29/2017, 3:05 PM  [place] MAIN [facility] SERVICES  [place], [place], [address] Phone: [phone]   Fax:  [phone]  Name: Kelsey Owens MRN: [id number] Date of Birth: [birth year]

## 2017-08-29 NOTE — Therapy (Signed)
[place] MAIN [facility] SERVICES [address] [place], [place], [address] Phone: [phone]   Fax:  [phone]  Physical Therapy Treatment  Patient Details  Name: Kelsey Owens MRN: [id number] Date of Birth: [birth year] Referring Provider: Jennelle Human   Encounter Date: 08/29/2017  PT End of Session - 08/29/17 1353    Visit Number  10    Number of Visits  17    Date for PT Re-Evaluation  09/10/17    PT Start Time  0146    PT Stop Time  0230    PT Time Calculation (min)  44 min    Equipment Utilized During Treatment  Gait belt    Activity Tolerance  Patient tolerated treatment well    Behavior During Therapy  [facility] for tasks assessed/performed       Past Medical History:  Diagnosis Date  . Arthritis   . GERD (gastroesophageal reflux disease)   . Parkinson disease [facility])     Past Surgical History:  Procedure Laterality Date  . ABDOMINAL HYSTERECTOMY    . CHOLECYSTECTOMY    . DEEP BRAIN STIMULATOR PLACEMENT    . HEMORROIDECTOMY      There were no vitals filed for this visit.  Subjective Assessment - 08/29/17 1352    Subjective  Patient is having pain 8/10 right shoulder.   No specific questions or concerns at this time.     Pertinent History  Patient has had parkinsons disease for the last 20 years. Her mobility has been getting worse after she had a left radius fx.  She was walking with RW in 10/18.  Patient has worseing loss of balance beginning 10/18.  She had HHPT 2 x week for 2 months beginining after hospitilization in Dec 2018.     Patient Stated Goals  Patient wants to be able to walk better and not fall as often    Currently in Pain?  Yes    Pain Score  8     Pain Location  Shoulder    Pain Orientation  Right    Pain Descriptors / Indicators  Aching    Pain Type  Chronic pain    Pain Onset  More than a month ago    Pain Frequency  Constant    Aggravating Factors   using her arm    Pain Relieving Factors  rest    Effect of Pain on Daily Activities  difficult to perform activities       Therapeutic exercise:  Supine hip flex x 10  Supine bridging x 10 Hip abd/ER x 10 x 2   Therapeutic activities:  Transfer training with max assist for correct sequencing and mod assist for transfer; patient has uncontrolled stand ot sit 50% of the time.  Gait training with rollator 80 feet x 2 with min assist and vc for posture and sequencing   Standing tolerance for 20 seconds with poor standing posture and flexed hips and flexed knees  Standing with support of mod assist and rollator for 2 mins , 1 min 1/2                           PT Education - 08/29/17 1353    Education Details  HEP    Person(s) Educated  Patient    Methods  Explanation    Comprehension  Verbalized understanding;Returned demonstration       PT Short Term Goals - 07/16/17 1441  PT SHORT TERM GOAL #1   Title  Patient will be independent in home exercise program to improve strength/mobility for better functional independence with ADLs.    Time  4    Period  Weeks    Status  New    Target Date  08/13/17      PT SHORT TERM GOAL #2   Title  Patient will perofrm sit to stand with correct hand placement and without support on the back of the chair with RW  and SBA.     Baseline  Patient needs max assist and needs max assist for correct hand placement.    Time  4    Period  Weeks    Status  New    Target Date  08/13/17      PT SHORT TERM GOAL #3   Title  Patient will stand for 1 minute with UE support to be able to perform safe transfers.     Baseline  Patient can stand for 10 seconds    Time  4    Period  Weeks    Status  New    Target Date  08/13/17        PT Long Term Goals - 08/22/17 1357      PT LONG TERM GOAL #1   Title  Patient will be able to ambulate in parallel bars x 10 feet with min assist.    Baseline  unable to ambulate with AD    Time  8    Period  Weeks    Status  Partially  Met    Target Date  09/10/17      PT LONG TERM GOAL #2   Title  Patient will ambulate short distances ,25 feet, with RW and mod assist.     Baseline  : 08/22/17 patient is ambulating 15 feet with RW    Time  8    Period  Weeks    Status  Partially Met    Target Date  09/10/17      PT LONG TERM GOAL #3   Title  Patient will be able to ascend 1 step with min assist with LARD.    Baseline  max assist with HHA    Time  8    Period  Weeks    Status  Partially Met    Target Date  09/10/17            Plan - 08/29/17 1355    Clinical Impression Statement Patient's condition has the potential to improve in response to therapy. Maximum improvement is yet to be obtained. The anticipated improvement is attainable and reasonable in a generally predictable time. Start date of reporting period 07/16/17 end date of reporting period 08/29/17. Marland Kitchen Patient reports that she is doing better with her mobility.Patient demonstrates BLE weakness, and impaired transfers and lack of static and dynamic standing balance that is causing difficulty with sit to stand transfers and standing activities. Patient still fatigues quickly to due very weak LE's. Patient showed poor safety technique and is able to generate good form after heavy cueing. Patient will continue to benefit from skilled physical therapy to improve LE strength and balance to improve quality of life.   Rehab Potential  Good    PT Frequency  2x / week    PT Duration  8 weeks    PT Treatment/Interventions  Therapeutic exercise;Therapeutic activities;Functional mobility training;Stair training;Balance training;Neuromuscular re-education;Patient/family education;Aquatic Therapy    PT Next Visit Plan  mobility  training, strengthening, balanace training    PT [place], hip flex    Consulted and Agree with Plan of Care  Patient;Family member/caregiver       Patient will benefit from skilled therapeutic intervention in order to improve the  following deficits and impairments:  Decreased balance, Decreased endurance, Decreased mobility, Decreased safety awareness, Decreased strength, Decreased activity tolerance  Visit Diagnosis: Muscle weakness (generalized)  Other lack of coordination  Difficulty in walking, not elsewhere classified  Pain and swelling of right lower extremity  Parkinson's disease ([place])  Dysphonia     Problem List Patient Active Problem List   Diagnosis Date Noted  . Pain and swelling of right lower extremity 01/02/2017  . Parkinson's disease ([place]) 01/02/2017     Alanson Puls, PT DPT 08/29/2017, 1:56 PM  [place] MAIN [facility] SERVICES [address] [place], [place], [address] Phone: [phone]   Fax:  [phone]  Name: Tvisha Schwoerer MRN: [id number] Date of Birth: 04/19/1940

## 2017-09-03 ENCOUNTER — Ambulatory Visit: Payer: Medicare Other | Admitting: Occupational Therapy

## 2017-09-03 ENCOUNTER — Encounter: Payer: Self-pay | Admitting: Physical Therapy

## 2017-09-03 ENCOUNTER — Ambulatory Visit: Payer: Medicare Other | Admitting: Physical Therapy

## 2017-09-03 ENCOUNTER — Encounter: Payer: Medicare Other | Admitting: Occupational Therapy

## 2017-09-03 DIAGNOSIS — M7989 Other specified soft tissue disorders: Secondary | ICD-10-CM

## 2017-09-03 DIAGNOSIS — R262 Difficulty in walking, not elsewhere classified: Secondary | ICD-10-CM

## 2017-09-03 DIAGNOSIS — R278 Other lack of coordination: Secondary | ICD-10-CM

## 2017-09-03 DIAGNOSIS — G20 Parkinson's disease: Secondary | ICD-10-CM

## 2017-09-03 DIAGNOSIS — M79604 Pain in right leg: Secondary | ICD-10-CM

## 2017-09-03 DIAGNOSIS — M6281 Muscle weakness (generalized): Secondary | ICD-10-CM

## 2017-09-03 NOTE — Therapy (Signed)
[place] MAIN [facility] SERVICES [address] [place], [place], [address] Phone: [phone]   Fax:  [phone]  Physical Therapy Treatment  Patient Details  Name: Kelsey Owens MRN: [id number] Date of Birth: 10/13/1940 Referring Provider: Jennelle Human   Encounter Date: 09/03/2017  PT End of Session - 09/03/17 1713    Visit Number  11    Number of Visits  17    Date for PT Re-Evaluation  09/10/17    PT Start Time  0230    PT Stop Time  0315    PT Time Calculation (min)  45 min    Equipment Utilized During Treatment  Gait belt    Activity Tolerance  Patient tolerated treatment well    Behavior During Therapy  [facility] for tasks assessed/performed       Past Medical History:  Diagnosis Date  . Arthritis   . GERD (gastroesophageal reflux disease)   . Parkinson disease [facility])     Past Surgical History:  Procedure Laterality Date  . ABDOMINAL HYSTERECTOMY    . CHOLECYSTECTOMY    . DEEP BRAIN STIMULATOR PLACEMENT    . HEMORROIDECTOMY      There were no vitals filed for this visit.  Subjective Assessment - 09/03/17 1623    Subjective  Patient is having pain 4/10  right foot. and he is having numbness and burning.   No specific questions or concerns at this time.     Pertinent History  Patient has had parkinsons disease for the last 20 years. Her mobility has been getting worse after she had a left radius fx.  She was walking with RW in 10/18.  Patient has worseing loss of balance beginning 10/18.  She had HHPT 2 x week for 2 months beginining after hospitilization in Dec 2018.     Patient Stated Goals  Patient wants to be able to walk better and not fall as often    Currently in Pain?  Yes    Pain Score  4     Pain Location  Foot    Pain Orientation  Right    Pain Descriptors / Indicators  Burning    Pain Onset  More than a month ago    Pain Frequency  Constant    Aggravating Factors   standing , walking    Effect of Pain on Daily  Activities  needs to rest more    Multiple Pain Sites  -- shoulder 3/10 left      Treatment: Gait training with rollator 100 feet , 75 feet, 40 feet with min assist.with small shuffling steps and poor motor control and flexed posture in hips and knees Stepping fwd and bwd single leg x 10 in parallel bars Transfer training sit to stand with min assist and max vc for cueing and sequencing and safety and cuing for hand placement  Therapeutic exercise: BLE hip flex with 2 lbs x 15 x 2  BLE LAQ with 2 lbs x 15 x 2  Bridges x 10 x 2 hooklying marching with 2 lbs x 15 x 2    CGA and  mod verbal cues used throughout with increased in postural sway and LOB most seen with narrow base of support and while on uneven surfaces. Continues to have balance deficits typical with diagnosis. Patient performs intermediate level exercises without pain behaviors and needs verbal cuing for postural alignment and head positioning  PT Education - 09/03/17 1625    Education Details  HEP  (Pended)     Person(s) Educated  Patient  (Pended)     Methods  Explanation  (Pended)     Comprehension  Verbalized understanding  (Pended)        PT Short Term Goals - 07/16/17 1441      PT SHORT TERM GOAL #1   Title  Patient will be independent in home exercise program to improve strength/mobility for better functional independence with ADLs.    Time  4    Period  Weeks    Status  New    Target Date  08/13/17      PT SHORT TERM GOAL #2   Title  Patient will perofrm sit to stand with correct hand placement and without support on the back of the chair with RW  and SBA.     Baseline  Patient needs max assist and needs max assist for correct hand placement.    Time  4    Period  Weeks    Status  New    Target Date  08/13/17      PT SHORT TERM GOAL #3   Title  Patient will stand for 1 minute with UE support to be able to perform safe transfers.     Baseline  Patient can stand for 10  seconds    Time  4    Period  Weeks    Status  New    Target Date  08/13/17        PT Long Term Goals - 08/22/17 1357      PT LONG TERM GOAL #1   Title  Patient will be able to ambulate in parallel bars x 10 feet with min assist.    Baseline  unable to ambulate with AD    Time  8    Period  Weeks    Status  Partially Met    Target Date  09/10/17      PT LONG TERM GOAL #2   Title  Patient will ambulate short distances ,25 feet, with RW and mod assist.     Baseline  : 08/22/17 patient is ambulating 15 feet with RW    Time  8    Period  Weeks    Status  Partially Met    Target Date  09/10/17      PT LONG TERM GOAL #3   Title  Patient will be able to ascend 1 step with min assist with LARD.    Baseline  max assist with HHA    Time  8    Period  Weeks    Status  Partially Met    Target Date  09/10/17            Plan - 09/03/17 1714    Clinical Impression Statement  Patient presents with decreased gait speed, decreased balance, and decreased BLE strength. Patient's main complaint is BLE weakness and inability to participate in desired activities such as standing and walking and transferring. . Patient wants to improve his balance and ability to ambulate safely. Patient will benefit from skilled PT in order to increase gait speed, increase BLE strength, and improve dynamic standing balance to decrease risk for falls and enable patient to participate in desired activities.    Rehab Potential  Good    PT Frequency  2x / week    PT Duration  8 weeks    PT Treatment/Interventions  Therapeutic  exercise;Therapeutic activities;Functional mobility training;Stair training;Balance training;Neuromuscular re-education;Patient/family education;Aquatic Therapy    PT Next Visit Plan  mobility training, strengthening, balanace training    PT Home Exercise Plan  LAQ, hip flex    Consulted and Agree with Plan of Care  Patient;Family member/caregiver       Patient will benefit from skilled  therapeutic intervention in order to improve the following deficits and impairments:  Decreased balance, Decreased endurance, Decreased mobility, Decreased safety awareness, Decreased strength, Decreased activity tolerance  Visit Diagnosis: Muscle weakness (generalized)  Other lack of coordination  Difficulty in walking, not elsewhere classified  Pain and swelling of right lower extremity  Parkinson's disease [facility])     Problem List Patient Active Problem List   Diagnosis Date Noted  . Pain and swelling of right lower extremity 01/02/2017  . Parkinson's disease ([place]) 01/02/2017    Alanson Puls, PT DPT  09/03/2017, 5:17 PM  [place] MAIN [facility] SERVICES [address] [place], [place], [address] Phone: [phone]   Fax:  [phone]  Name: Kelsey Owens MRN: [id number] Date of Birth: 1940/09/26

## 2017-09-05 ENCOUNTER — Ambulatory Visit: Payer: Medicare Other | Admitting: Occupational Therapy

## 2017-09-05 ENCOUNTER — Encounter: Payer: Self-pay | Admitting: Occupational Therapy

## 2017-09-05 ENCOUNTER — Encounter: Payer: Self-pay | Admitting: Physical Therapy

## 2017-09-05 ENCOUNTER — Ambulatory Visit: Payer: Medicare Other | Admitting: Physical Therapy

## 2017-09-05 ENCOUNTER — Other Ambulatory Visit: Payer: Self-pay

## 2017-09-05 DIAGNOSIS — R278 Other lack of coordination: Secondary | ICD-10-CM

## 2017-09-05 DIAGNOSIS — G20 Parkinson's disease: Secondary | ICD-10-CM

## 2017-09-05 DIAGNOSIS — M6281 Muscle weakness (generalized): Secondary | ICD-10-CM

## 2017-09-05 DIAGNOSIS — M7989 Other specified soft tissue disorders: Secondary | ICD-10-CM

## 2017-09-05 DIAGNOSIS — M79604 Pain in right leg: Secondary | ICD-10-CM | POA: Diagnosis not present

## 2017-09-05 DIAGNOSIS — R262 Difficulty in walking, not elsewhere classified: Secondary | ICD-10-CM

## 2017-09-05 NOTE — Therapy (Signed)
[place] MAIN [facility] SERVICES [address] [place], [place], [address] Phone: [phone]   Fax:  [phone]  Occupational Therapy Treatment  Patient Details  Name: Kelsey Owens MRN: [id number] Date of Birth: May 17, 1940 Referring Provider: Jennelle Human   Encounter Date: 09/05/2017  OT End of Session - 09/05/17 1727    Visit Number  8    Number of Visits  24    Date for OT Re-Evaluation  10/10/17    Authorization Time Period  Visit 7 of 10 for progress report period starting 07/18/2017    OT Start Time  1430    OT Stop Time  1515    OT Time Calculation (min)  45 min    Activity Tolerance  Patient tolerated treatment well;Treatment limited secondary to agitation;Patient limited by lethargy;Patient limited by pain    Behavior During Therapy  [facility] for tasks assessed/performed       Past Medical History:  Diagnosis Date  . Arthritis   . GERD (gastroesophageal reflux disease)   . Parkinson disease [facility])     Past Surgical History:  Procedure Laterality Date  . ABDOMINAL HYSTERECTOMY    . CHOLECYSTECTOMY    . DEEP BRAIN STIMULATOR PLACEMENT    . HEMORROIDECTOMY      There were no vitals filed for this visit.  Subjective Assessment - 09/05/17 1725    Subjective   Pt. brought in pictures of her daughter, and granddaughter.    Patient is accompained by:  Family member    Pertinent History  Pt. is a 77 y.o. female with a history of Parkinsons Disease. Pt. has a Deep Brain Stimulator, and has recently had it adjusted. Pt. has had multiple falls, and has a history of multiple wrist fractures.     Currently in Pain?  No/denies    Pain Score  4       OT TREATMENT    Therapeutic Exercise:  Pt. Performed hand strengthening with green theraputty. Pt. required cues for proper technique. Pt. worked on gross grip, lateral pinch, 3pt. pinch, gross digit extension, digit abduction loop, and thumb opposition.  Pt. required verbal cues,  tactile cues, and visual demonstration for proper technique.                         OT Education - 09/05/17 1727    Education Details  BUE functioning    Person(s) Educated  Patient    Methods  Explanation    Comprehension  Verbalized understanding          OT Long Term Goals - 07/18/17 1739      OT LONG TERM GOAL #1   Title  Pt. will improve BUE strength by 2 muscle grades to improve ADL, and IADL functioning.    Baseline  Eval: Limited BUE strength: Refer to flowsheet for detailed objective measurements.    Time  12    Period  Weeks    Status  New    Target Date  10/10/17      OT LONG TERM GOAL #2   Title  Pt. will increase Left grip strength by 5# to be able to open jars, and containers.    Baseline  Eval: Limited grip strength    Time  12    Period  Weeks    Status  New    Target Date  10/10/17      OT LONG TERM GOAL #3   Title  Pt. will improve left hand [facility] skills  to be able to take items (cards/change) out of a wallet.    Baseline  Eval: limited [facility] skills    Time  12    Period  Weeks    Status  New    Target Date  10/10/17      OT LONG TERM GOAL #4   Title  Pt. will independently write one sentence efficiently with 75% legibility.    Baseline  Eval: 25% legibility for name only.    Time  12    Period  Weeks    Status  New    Target Date  10/10/17      OT LONG TERM GOAL #5   Title  Pt. independently demonstrate work simplification strategies, and compensatory startegies for safe IADL tasks/home management tasks.       Baseline  Eval: Pt. has difficulty    Time  12    Period  Weeks    Status  New    Target Date  10/10/17      OT LONG TERM GOAL #6   Title  Pt. will independently demonstrate work simplification strategies, and compensatory startegies within the kitchen.    Baseline  Eval: Pt. is unable    Time  12    Period  Weeks    Status  New    Target Date  10/10/17            Plan - 09/05/17 1727    Clinical  Impression Statement  Pt. is making progress overall. Pt. requires verbal cues, tactile cues, visual demonstration, and assist for motor planning through the movement patterns. Pt. continues to work on improving ADL, and IADL functioning.    Occupational Profile and client history currently impacting functional performance  Pt. resides with her husband who is a Theme park manager. Pt. has grown children.    Occupational performance deficits (Please refer to evaluation for details):  ADL's;IADL's    Current Impairments/barriers affecting progress:  Positive Barriers: age, motivation, family support. Negative Barriers: multiple comorbidities, history of multiple falls.    OT Frequency  2x / week    OT Duration  12 weeks    OT Treatment/Interventions  Self-care/ADL training;Electrical Stimulation;Neuromuscular education;Therapeutic exercise;DME and/or AE instruction;Visual/perceptual remediation/compensation;Patient/family education;Therapeutic activities    Clinical Decision Making  Multiple treatment options, significant modification of task necessary    Consulted and Agree with Plan of Care  Patient       Patient will benefit from skilled therapeutic intervention in order to improve the following deficits and impairments:  Abnormal gait, Decreased cognition, Pain, Decreased strength, Impaired UE functional use, Decreased balance, Decreased activity tolerance  Visit Diagnosis: Muscle weakness (generalized)  Other lack of coordination    Problem List Patient Active Problem List   Diagnosis Date Noted  . Pain and swelling of right lower extremity 01/02/2017  . Parkinson's disease ([place]) 01/02/2017    Harrel Carina, MS, OTR/L 09/05/2017, 5:34 PM  [place] MAIN [facility] SERVICES [address] [place], [place], [address] Phone: [phone]   Fax:  [phone]  Name: Faatima Tench MRN: [id number] Date of Birth: 28-Dec-1940

## 2017-09-05 NOTE — Therapy (Signed)
[place] MAIN [facility] SERVICES [address] [place], [place], [address] Phone: [phone]   Fax:  [phone]  Physical Therapy Treatment  Patient Details  Name: Kelsey Owens MRN: [id number] Date of Birth: 1940-04-12 Referring Provider: Jennelle Human   Encounter Date: 09/05/2017  PT End of Session - 09/05/17 1422    Visit Number  11    Number of Visits  17    Date for PT Re-Evaluation  09/10/17    PT Start Time  0154    PT Stop Time  0232    PT Time Calculation (min)  38 min    Equipment Utilized During Treatment  Gait belt    Activity Tolerance  Patient tolerated treatment well    Behavior During Therapy  [facility] for tasks assessed/performed       Past Medical History:  Diagnosis Date  . Arthritis   . GERD (gastroesophageal reflux disease)   . Parkinson disease [facility])     Past Surgical History:  Procedure Laterality Date  . ABDOMINAL HYSTERECTOMY    . CHOLECYSTECTOMY    . DEEP BRAIN STIMULATOR PLACEMENT    . HEMORROIDECTOMY      There were no vitals filed for this visit.  Subjective Assessment - 09/05/17 1420    Subjective  Patient is having no pain    No specific questions or concerns at this time.     Pertinent History  Patient has had parkinsons disease for the last 20 years. Her mobility has been getting worse after she had a left radius fx.  She was walking with RW in 10/18.  Patient has worseing loss of balance beginning 10/18.  She had HHPT 2 x week for 2 months beginining after hospitilization in Dec 2018.     Patient Stated Goals  Patient wants to be able to walk better and not fall as often    Currently in Pain?  No/denies    Pain Onset  More than a month ago        Therapeutic exercise:   Transfer training sit to stand from mat to rollator with min assist and vc for sequencing and hand placement. 3 x 2 sets   Bridges x 15 x 2  hooklying marching  2   hooklying ER/abd x 20 with YTB   SAQ with 3 lbs  x 20 BLE  SLR x 15 BLE   Leg press 90 lbs x 20 x 3 sets   CGA and Min to mod verbal cues used throughout with increased in postural sway and LOB most seen with narrow base of support and while on uneven surfaces. Continues to have balance deficits typical with diagnosis. Patient performs intermediate level exercises without pain behaviors and needs verbal cuing for postural alignment and head positioning                         PT Education - 09/05/17 1422    Education Details  HEP    Person(s) Educated  Patient    Methods  Explanation    Comprehension  Verbalized understanding       PT Short Term Goals - 07/16/17 1441      PT SHORT TERM GOAL #1   Title  Patient will be independent in home exercise program to improve strength/mobility for better functional independence with ADLs.    Time  4    Period  Weeks    Status  New  Target Date  08/13/17      PT SHORT TERM GOAL #2   Title  Patient will perofrm sit to stand with correct hand placement and without support on the back of the chair with RW  and SBA.     Baseline  Patient needs max assist and needs max assist for correct hand placement.    Time  4    Period  Weeks    Status  New    Target Date  08/13/17      PT SHORT TERM GOAL #3   Title  Patient will stand for 1 minute with UE support to be able to perform safe transfers.     Baseline  Patient can stand for 10 seconds    Time  4    Period  Weeks    Status  New    Target Date  08/13/17        PT Long Term Goals - 08/22/17 1357      PT LONG TERM GOAL #1   Title  Patient will be able to ambulate in parallel bars x 10 feet with min assist.    Baseline  unable to ambulate with AD    Time  8    Period  Weeks    Status  Partially Met    Target Date  09/10/17      PT LONG TERM GOAL #2   Title  Patient will ambulate short distances ,25 feet, with RW and mod assist.     Baseline  : 08/22/17 patient is ambulating 15 feet with RW    Time  8     Period  Weeks    Status  Partially Met    Target Date  09/10/17      PT LONG TERM GOAL #3   Title  Patient will be able to ascend 1 step with min assist with LARD.    Baseline  max assist with HHA    Time  8    Period  Weeks    Status  Partially Met    Target Date  09/10/17            Plan - 09/05/17 1423    Clinical Impression Statement  Patient demonstrates BLE weakness, and impaired transfers and lack of static and dynamic standing balance that is causing difficulty with sit to stand transfers and standing activities. Patient still fatigues quickly to due very weak LE's. Patient showed poor safety technique and is able to generate good form after heavy cueing. Patient will continue to benefit from skilled physical therapy to improve LE strength and balance to improve quality of life    Rehab Potential  Good    PT Frequency  2x / week    PT Duration  8 weeks    PT Treatment/Interventions  Therapeutic exercise;Therapeutic activities;Functional mobility training;Stair training;Balance training;Neuromuscular re-education;Patient/family education;Aquatic Therapy    PT Next Visit Plan  mobility training, strengthening, balanace training    PT Home Exercise Plan  LAQ, hip flex    Consulted and Agree with Plan of Care  Patient;Family member/caregiver       Patient will benefit from skilled therapeutic intervention in order to improve the following deficits and impairments:  Decreased balance, Decreased endurance, Decreased mobility, Decreased safety awareness, Decreased strength, Decreased activity tolerance  Visit Diagnosis: Muscle weakness (generalized)  Other lack of coordination  Difficulty in walking, not elsewhere classified  Pain and swelling of right lower extremity  Parkinson's disease ([place])  Problem List Patient Active Problem List   Diagnosis Date Noted  . Pain and swelling of right lower extremity 01/02/2017  . Parkinson's disease ([place]) 01/02/2017     Alanson Puls, PT DPT 09/05/2017, 2:24 PM  [place] MAIN [facility] SERVICES [address] [place], [place], [address] Phone: [phone]   Fax:  [phone]  Name: Kelsey Owens MRN: [id number] Date of Birth: 29-Jun-1940

## 2017-09-10 ENCOUNTER — Ambulatory Visit: Payer: Medicare Other | Admitting: Physical Therapy

## 2017-09-10 ENCOUNTER — Ambulatory Visit: Payer: Medicare Other | Admitting: Occupational Therapy

## 2017-09-12 ENCOUNTER — Ambulatory Visit: Payer: Medicare Other | Admitting: Physical Therapy

## 2017-09-12 ENCOUNTER — Encounter: Payer: Self-pay | Admitting: Physical Therapy

## 2017-09-12 DIAGNOSIS — M79604 Pain in right leg: Secondary | ICD-10-CM

## 2017-09-12 DIAGNOSIS — R278 Other lack of coordination: Secondary | ICD-10-CM

## 2017-09-12 DIAGNOSIS — M6281 Muscle weakness (generalized): Secondary | ICD-10-CM

## 2017-09-12 DIAGNOSIS — R262 Difficulty in walking, not elsewhere classified: Secondary | ICD-10-CM

## 2017-09-12 DIAGNOSIS — G20 Parkinson's disease: Secondary | ICD-10-CM

## 2017-09-12 DIAGNOSIS — M7989 Other specified soft tissue disorders: Secondary | ICD-10-CM

## 2017-09-12 NOTE — Therapy (Signed)
[place] [facility] SERVICES [address] [place], [place], [address] Phone: [phone]   Fax:  [phone]  Physical Therapy Treatment  Patient Details  Name: Kelsey Owens MRN: [id number] Date of Birth: 1940-12-16 Referring Provider: Jennelle Human   Encounter Date: 09/12/2017  PT End of Session - 09/12/17 1421    Visit Number  12    Number of Visits  17    Date for PT Re-Evaluation  12/03/17    PT Start Time  0145    PT Stop Time  0230    PT Time Calculation (min)  45 min    Equipment Utilized During Treatment  Gait belt    Activity Tolerance  Patient tolerated treatment well    Behavior During Therapy  Kelsey Owens for tasks assessed/performed       Past Medical History:  Diagnosis Date  . Arthritis   . GERD (gastroesophageal reflux disease)   . Parkinson disease [facility])     Past Surgical History:  Procedure Laterality Date  . ABDOMINAL HYSTERECTOMY    . CHOLECYSTECTOMY    . DEEP BRAIN STIMULATOR PLACEMENT    . HEMORROIDECTOMY      There were no vitals filed for this visit.  Subjective Assessment - 09/12/17 1420    Subjective  Patient is having no pain    No specific questions or concerns at this time.     Pertinent History  Patient has had parkinsons disease for Kelsey last 20 years. Her mobility has been getting worse after she had a left radius fx.  She was walking with RW in 10/18.  Patient has worseing loss of balance beginning 10/18.  She had HHPT 2 x week for 2 months beginining after hospitilization in Dec 2018.     Patient Stated Goals  Patient wants to be able to walk better and not fall as often    Currently in Pain?  Yes    Pain Score  8     Pain Location  Shoulder    Pain Orientation  Right    Pain Descriptors / Indicators  Aching    Pain Onset  More than a month ago    Multiple Pain Sites  No         Exercise:   Sit<>Stand  x10 reps with min VCs to increase forward lean for better transfer  ability;  hooklying marching x 20 x 2 hooklying hip ER/abd x 20 x 2 Supine march 2x10 with cues for increased hip flexion 2x10 Supine bridge with cues to stabilize pelvis 2x10 Supine SLR 4x5 BLE (assist for LLE) sidelying clamshell 3x5 BLE, cue to perform slowly sidelying hip abduction with assist 3x5 BLE  Gait training with Rolator and min assist 25 feet x 3   CGA and  mod verbal cues used throughout with increased in postural sway and LOB most seen with narrow base of support and while on uneven surfaces. Patient needs UE support during 50 % of  balance activities and has back pain and fatigue and needs a seated rest period.                     PT Education - 09/12/17 1420    Education Details  HEP    Person(s) Educated  Patient    Methods  Explanation    Comprehension  Verbalized understanding       PT Short Term Goals - 09/12/17 1423      PT SHORT TERM  GOAL #1   Title  Patient will be independent in home exercise program to improve strength/mobility for better functional independence with ADLs.    Time  4    Period  Weeks    Status  On-going    Target Date  12/03/17      PT SHORT TERM GOAL #2   Title  Patient will perofrm sit to stand with correct hand placement and without support on Kelsey back of Kelsey chair with RW  and SBA.     Baseline  Patient needs max assist and needs max assist for correct hand placement.    Time  4    Period  Weeks    Status  Unable to assess    Target Date  12/03/17      PT SHORT TERM GOAL #3   Title  Patient will stand for 1 minute with UE support to be able to perform safe transfers.     Baseline  Patient can stand for 10 seconds    Time  4    Period  Weeks    Status  New        PT Long Term Goals - 09/12/17 1422      PT LONG TERM GOAL #1   Title  Patient will be able to ambulate in parallel bars x 10 feet with min assist.    Baseline  ; Patient is ambulating wiht rollator 25 feet and min assist    Time  12     Period  Weeks    Status  Partially Met    Target Date  12/03/17      PT LONG TERM GOAL #2   Title  Patient will ambulate short distances ,25 feet, with RW and mod assist.     Baseline  : 08/22/17 patient is ambulating 15 feet with RW:09/12/17 25 feet with min assist and rollator    Time  12    Period  Weeks    Status  Partially Met    Target Date  12/03/17      PT LONG TERM GOAL #3   Title  Patient will be able to ascend 1 step with min assist with LARD.    Baseline  max assist with HHA    Time  12    Period  Weeks    Status  Partially Met    Target Date  12/03/17            Plan - 09/12/17 1427    Clinical Impression Statement  Patient demonstrates improved stability and strength allowing patient to perform short duration standing interventions with rest periods.  Patient performs beginning gait training  to shift weight and perform standing activities with min assist. Patient fatigues quickly with exercises requiring rest breaks at this time. Patient will continue to benefit from skilled physical therapy to improve pain and mobility.    Rehab Potential  Good    PT Frequency  2x / week    PT Duration  12 weeks    PT Treatment/Interventions  Therapeutic exercise;Therapeutic activities;Functional mobility training;Stair training;Balance training;Neuromuscular re-education;Patient/family education;Aquatic Therapy    PT Next Visit Plan  mobility training, strengthening, balanace training    PT Home Exercise Plan  LAQ, hip flex    Consulted and Agree with Plan of Care  Patient;Family member/caregiver       Patient will benefit from skilled therapeutic intervention in order to improve Kelsey following deficits and impairments:  Decreased balance, Decreased endurance, Decreased mobility,  Decreased safety awareness, Decreased strength, Decreased activity tolerance  Visit Diagnosis: Muscle weakness (generalized)  Other lack of coordination  Difficulty in walking, not elsewhere  classified  Pain and swelling of right lower extremity  Parkinson's disease [facility])     Problem List Patient Active Problem List   Diagnosis Date Noted  . Pain and swelling of right lower extremity 01/02/2017  . Parkinson's disease ([place]) 01/02/2017    Alanson Puls, PT DPT 09/12/2017, 2:41 PM  [place] [facility] SERVICES [address] [place], [place], [address] Phone: [phone]   Fax:  [phone]  Name: Audie Wieser MRN: [id number] Date of Birth: 10-16-1940

## 2017-09-17 ENCOUNTER — Encounter: Payer: Self-pay | Admitting: Physical Therapy

## 2017-09-17 ENCOUNTER — Ambulatory Visit: Payer: Medicare Other | Admitting: Physical Therapy

## 2017-09-17 ENCOUNTER — Ambulatory Visit: Payer: Medicare Other | Attending: Family Medicine | Admitting: Occupational Therapy

## 2017-09-17 ENCOUNTER — Encounter: Payer: Self-pay | Admitting: Occupational Therapy

## 2017-09-17 DIAGNOSIS — M79604 Pain in right leg: Secondary | ICD-10-CM | POA: Insufficient documentation

## 2017-09-17 DIAGNOSIS — R278 Other lack of coordination: Secondary | ICD-10-CM | POA: Diagnosis present

## 2017-09-17 DIAGNOSIS — M7989 Other specified soft tissue disorders: Secondary | ICD-10-CM | POA: Insufficient documentation

## 2017-09-17 DIAGNOSIS — R262 Difficulty in walking, not elsewhere classified: Secondary | ICD-10-CM | POA: Diagnosis present

## 2017-09-17 DIAGNOSIS — R490 Dysphonia: Secondary | ICD-10-CM | POA: Insufficient documentation

## 2017-09-17 DIAGNOSIS — M6281 Muscle weakness (generalized): Secondary | ICD-10-CM

## 2017-09-17 DIAGNOSIS — G20A1 Parkinson's disease without dyskinesia, without mention of fluctuations: Secondary | ICD-10-CM

## 2017-09-17 DIAGNOSIS — G20 Parkinson's disease: Secondary | ICD-10-CM | POA: Diagnosis present

## 2017-09-17 NOTE — Therapy (Signed)
[place] MAIN [facility] SERVICES [address] [place], [place], [address] Phone: [phone]   Fax:  [phone]  Occupational Therapy Treatment  Patient Details  Name: Eleena Grater MRN: [id number] Date of Birth: October 29, 1940 Referring Provider: Jennelle Human   Encounter Date: 09/17/2017  OT End of Session - 09/17/17 1415    Visit Number  9    Number of Visits  24    Date for OT Re-Evaluation  10/10/17    OT Start Time  1345    OT Stop Time  1430    OT Time Calculation (min)  45 min    Activity Tolerance  Patient tolerated treatment well    Behavior During Therapy  [facility] for tasks assessed/performed       Past Medical History:  Diagnosis Date  . Arthritis   . GERD (gastroesophageal reflux disease)   . Parkinson disease [facility])     Past Surgical History:  Procedure Laterality Date  . ABDOMINAL HYSTERECTOMY    . CHOLECYSTECTOMY    . DEEP BRAIN STIMULATOR PLACEMENT    . HEMORROIDECTOMY      There were no vitals filed for this visit.  Subjective Assessment - 09/17/17 1359    Subjective   Pt stated she was doing well.    Patient is accompained by:  Family member    Pertinent History  Pt. is a 77 y.o. female with a history of Parkinsons Disease. Pt. has a Deep Brain Stimulator, and has recently had it adjusted. Pt. has had multiple falls, and has a history of multiple wrist fractures.     Currently in Pain?  No/denies       Pt. performed hand strengthening with medium soft red theraputty. Pt. required cues for proper technique. Pt. worked on gross grip loop, lateral pinch, 3pt. pinch, and used Puttycise tools for lateral turning motion and depression with medium and large tool. Pt. required verbal and tactile cues for proper technique.She stated she had pain in left palm with sustained grasp or flat hand movement but it was only present for a few seconds and then went away.  Discussed how she can start to help with meal preparation and  given shelf liner to help stabilize large bowls and plates to help with stirring items needed for meals.  Also recommended she start to help with folding laundry but she stated that her husband wants to do everything for her.  Will coninue to work on this with her husband and how to help facilitate this.    Worked on 2 point pinch using pegboard with assist for colors since she is color blind and cannot tell the difference from blue and green.  Mod assist and cues to follow simple instructions for following a design pattern.  Max cues and hand over hand to follow alternation of right and left hand.                      OT Education - 09/17/17 1414    Education Details  BUE functioning    Person(s) Educated  Patient    Methods  Explanation    Comprehension  Verbalized understanding;Returned demonstration          OT Long Term Goals - 07/18/17 1739      OT LONG TERM GOAL #1   Title  Pt. will improve BUE strength by 2 muscle grades to improve ADL, and IADL functioning.    Baseline  Eval: Limited BUE strength:  Refer to flowsheet for detailed objective measurements.    Time  12    Period  Weeks    Status  New    Target Date  10/10/17      OT LONG TERM GOAL #2   Title  Pt. will increase Left grip strength by 5# to be able to open jars, and containers.    Baseline  Eval: Limited grip strength    Time  12    Period  Weeks    Status  New    Target Date  10/10/17      OT LONG TERM GOAL #3   Title  Pt. will improve left hand [facility] skills  to be able to take items (cards/change) out of a wallet.    Baseline  Eval: limited [facility] skills    Time  12    Period  Weeks    Status  New    Target Date  10/10/17      OT LONG TERM GOAL #4   Title  Pt. will independently write one sentence efficiently with 75% legibility.    Baseline  Eval: 25% legibility for name only.    Time  12    Period  Weeks    Status  New    Target Date  10/10/17      OT LONG TERM GOAL #5   Title   Pt. independently demonstrate work simplification strategies, and compensatory startegies for safe IADL tasks/home management tasks.       Baseline  Eval: Pt. has difficulty    Time  12    Period  Weeks    Status  New    Target Date  10/10/17      OT LONG TERM GOAL #6   Title  Pt. will independently demonstrate work simplification strategies, and compensatory startegies within the kitchen.    Baseline  Eval: Pt. is unable    Time  12    Period  Weeks    Status  New    Target Date  10/10/17            Plan - 09/17/17 1416    Clinical Impression Statement  Pt is motivated to continue to increase function to participate in ADLs.  Discussed having her start to participate in meal preparation from a sitting position to feel like she is more purposeful and her husband is not doing everything for her.  She feels he does not want her to participate in anything standing since she has had falls in the past.  Given  piece of shelf liner to help stabilize bowls on table to help with stirring items needed for meal prep.    Occupational Profile and client history currently impacting functional performance  Pt. resides with her husband who is a Theme park manager. Pt. has grown children.    Occupational performance deficits (Please refer to evaluation for details):  ADL's;IADL's    Current Impairments/barriers affecting progress:  Positive Barriers: age, motivation, family support. Negative Barriers: multiple comorbidities, history of multiple falls.    OT Frequency  2x / week    OT Duration  12 weeks    OT Treatment/Interventions  Self-care/ADL training;Neuromuscular education;Therapeutic exercise;DME and/or AE instruction;Visual/perceptual remediation/compensation;Patient/family education;Therapeutic activities    Clinical Decision Making  Several treatment options, min-mod task modification necessary    Consulted and Agree with Plan of Care  Patient       Patient will benefit from skilled therapeutic  intervention in order to improve the following deficits  and impairments:  Abnormal gait, Decreased cognition, Pain, Decreased strength, Impaired UE functional use, Decreased balance, Decreased activity tolerance  Visit Diagnosis: Muscle weakness (generalized)  Other lack of coordination    Problem List Patient Active Problem List   Diagnosis Date Noted  . Pain and swelling of right lower extremity 01/02/2017  . Parkinson's disease [facility]) 01/02/2017    Chrys Racer, OTR/L ascom [phone] 09/17/17, 2:31 PM [place] MAIN [facility] SERVICES [address] [place], [place], [address] Phone: [phone]   Fax:  [phone]  Name: Willamae Demby MRN: [id number] Date of Birth: 11/28/1940

## 2017-09-17 NOTE — Therapy (Signed)
[place] MAIN [facility] SERVICES [address] [place], [place], [address] Phone: [phone]   Fax:  [phone]  Physical Therapy Treatment  Patient Details  Name: Kelsey Owens MRN: [id number] Date of Birth: 09-24-1940 Referring Provider: Jennelle Human   Encounter Date: 09/17/2017  PT End of Session - 09/17/17 1437    Visit Number  13    Number of Visits  17    Date for PT Re-Evaluation  12/03/17    PT Start Time  0230    PT Stop Time  0310    PT Time Calculation (min)  40 min    Equipment Utilized During Treatment  Gait belt    Activity Tolerance  Patient tolerated treatment well    Behavior During Therapy  [facility] for tasks assessed/performed       Past Medical History:  Diagnosis Date  . Arthritis   . GERD (gastroesophageal reflux disease)   . Parkinson disease [facility])     Past Surgical History:  Procedure Laterality Date  . ABDOMINAL HYSTERECTOMY    . CHOLECYSTECTOMY    . DEEP BRAIN STIMULATOR PLACEMENT    . HEMORROIDECTOMY      There were no vitals filed for this visit.  Subjective Assessment - 09/17/17 1442    Subjective  Patient is having 5/10 pain to her shoulder.  No specific questions or concerns at this time.     Pertinent History  Patient has had parkinsons disease for the last 20 years. Her mobility has been getting worse after she had a left radius fx.  She was walking with RW in 10/18.  Patient has worseing loss of balance beginning 10/18.  She had HHPT 2 x week for 2 months beginining after hospitilization in Dec 2018.     Patient Stated Goals  Patient wants to be able to walk better and not fall as often    Currently in Pain?  Yes    Pain Score  5     Pain Location  Shoulder    Pain Orientation  Right    Pain Descriptors / Indicators  Aching    Pain Onset  More than a month ago      Transfer training from wc to nu-step; nu-step to wc; wc to stand x 5 in parallel bars with min assist and max cues for sequencing  due to poor motor control and also due to poor posture.  Gait training with rollator and min assist and better motor control RLE and better knee extension during stance phase of gait and use of BUE to assist x 30 feet, 40 feet, 85 feet with low fatigue   Therapeutic exercise: Standing hip abd x 10 BLE  Standing hip ext x 10 BLE  Patient has better  posture during exercises and has less difficulty standing on single leg to perform standing exercises without min assist and parallel bars  Patient reports that her leg does not hurt during treatment today                          PT Education - 09/17/17 1437    Education Details  HEP    Person(s) Educated  Patient    Methods  Explanation;Tactile cues;Demonstration;Verbal cues    Comprehension  Verbalized understanding;Returned demonstration       PT Short Term Goals - 09/12/17 1423      PT SHORT TERM GOAL #1   Title  Patient will be  independent in home exercise program to improve strength/mobility for better functional independence with ADLs.    Time  4    Period  Weeks    Status  On-going    Target Date  12/03/17      PT SHORT TERM GOAL #2   Title  Patient will perofrm sit to stand with correct hand placement and without support on the back of the chair with RW  and SBA.     Baseline  Patient needs max assist and needs max assist for correct hand placement.    Time  4    Period  Weeks    Status  Unable to assess    Target Date  12/03/17      PT SHORT TERM GOAL #3   Title  Patient will stand for 1 minute with UE support to be able to perform safe transfers.     Baseline  Patient can stand for 10 seconds    Time  4    Period  Weeks    Status  New        PT Long Term Goals - 09/12/17 1422      PT LONG TERM GOAL #1   Title  Patient will be able to ambulate in parallel bars x 10 feet with min assist.    Baseline  ; Patient is ambulating wiht rollator 25 feet and min assist    Time  12     Period  Weeks    Status  Partially Met    Target Date  12/03/17      PT LONG TERM GOAL #2   Title  Patient will ambulate short distances ,25 feet, with RW and mod assist.     Baseline  : 08/22/17 patient is ambulating 15 feet with RW:09/12/17 25 feet with min assist and rollator    Time  12    Period  Weeks    Status  Partially Met    Target Date  12/03/17      PT LONG TERM GOAL #3   Title  Patient will be able to ascend 1 step with min assist with LARD.    Baseline  max assist with HHA    Time  12    Period  Weeks    Status  Partially Met    Target Date  12/03/17            Plan - 09/17/17 1438    Clinical Impression Statement  Patient demonstrates BLE weakness, and impaired transfers and lack of static and dynamic standing balance that is causing difficulty with sit to stand transfers and standing activities. Patient still fatigues quickly to due very weak LE's. Patient showed poor safety technique and is able to generate good form after heavy cueing. Patient will continue to benefit from skilled physical therapy to improve LE strength and balance to improve quality of life    Rehab Potential  Good    PT Frequency  2x / week    PT Duration  12 weeks    PT Treatment/Interventions  Therapeutic exercise;Therapeutic activities;Functional mobility training;Stair training;Balance training;Neuromuscular re-education;Patient/family education;Aquatic Therapy    PT Next Visit Plan  mobility training, strengthening, balanace training    PT Home Exercise Plan  LAQ, hip flex    Consulted and Agree with Plan of Care  Patient;Family member/caregiver       Patient will benefit from skilled therapeutic intervention in order to improve the following deficits and impairments:  Decreased balance, Decreased  endurance, Decreased mobility, Decreased safety awareness, Decreased strength, Decreased activity tolerance  Visit Diagnosis: Muscle weakness (generalized)  Other lack of  coordination  Difficulty in walking, not elsewhere classified  Pain and swelling of right lower extremity  Parkinson's disease [facility])     Problem List Patient Active Problem List   Diagnosis Date Noted  . Pain and swelling of right lower extremity 01/02/2017  . Parkinson's disease ([place]) 01/02/2017    Alanson Puls, PT DPT 09/17/2017, 4:14 PM  [place] MAIN [facility] SERVICES [address] [place], [place], [address] Phone: [phone]   Fax:  [phone]  Name: [place] MRN: [id number] Date of Birth: [birth year]

## 2017-09-19 ENCOUNTER — Encounter: Payer: Self-pay | Admitting: Occupational Therapy

## 2017-09-19 ENCOUNTER — Encounter: Payer: Self-pay | Admitting: Physical Therapy

## 2017-09-19 ENCOUNTER — Ambulatory Visit: Payer: Medicare Other | Admitting: Occupational Therapy

## 2017-09-19 ENCOUNTER — Ambulatory Visit: Payer: Medicare Other | Admitting: Physical Therapy

## 2017-09-19 DIAGNOSIS — G20 Parkinson's disease: Secondary | ICD-10-CM

## 2017-09-19 DIAGNOSIS — G20A1 Parkinson's disease without dyskinesia, without mention of fluctuations: Secondary | ICD-10-CM

## 2017-09-19 DIAGNOSIS — M6281 Muscle weakness (generalized): Secondary | ICD-10-CM | POA: Diagnosis not present

## 2017-09-19 DIAGNOSIS — M7989 Other specified soft tissue disorders: Secondary | ICD-10-CM

## 2017-09-19 DIAGNOSIS — R262 Difficulty in walking, not elsewhere classified: Secondary | ICD-10-CM

## 2017-09-19 DIAGNOSIS — M79604 Pain in right leg: Secondary | ICD-10-CM

## 2017-09-19 DIAGNOSIS — R278 Other lack of coordination: Secondary | ICD-10-CM

## 2017-09-19 NOTE — Therapy (Signed)
[place], [place], [place] Phone: [phone]   Fax:  [phone]  Physical Therapy Treatment  Patient Details  Name: [place] Owens MRN: [id number] Date of Birth: [birth year] Referring Provider: Jennelle Human   Encounter Date: 09/19/2017  PT End of Session - 09/19/17 1501    Visit Number  14    Number of Visits  17    Date for PT Re-Evaluation  12/03/17    PT Start Time  0230    PT Stop Time  0310    PT Time Calculation (min)  40 min    Equipment Utilized During Treatment  Gait belt    Activity Tolerance  Patient tolerated treatment well    Behavior During Therapy  [facility] for tasks assessed/performed       Past Medical History:  Diagnosis Date  . Arthritis   . GERD (gastroesophageal reflux disease)   . Parkinson disease [facility])     Past Surgical History:  Procedure Laterality Date  . ABDOMINAL HYSTERECTOMY    . CHOLECYSTECTOMY    . DEEP BRAIN STIMULATOR PLACEMENT    . HEMORROIDECTOMY      There were no vitals filed for this visit.  Subjective Assessment - 09/19/17 1500    Subjective  Patient reports that she is walking better but she is having difficulty with getting up from a chair.     Pertinent History  Patient has had parkinsons disease for the last 20 years. Her mobility has been getting worse after she had a left radius fx.  She was walking with RW in 10/18.  Patient has worseing loss of balance beginning 10/18.  She had HHPT 2 x week for 2 months beginining after hospitilization in Dec 2018.     Patient Stated Goals  Patient wants to be able to walk better and not fall as often    Currently in Pain?  Yes    Pain Score  3     Pain Location  Shoulder    Pain Orientation  Right    Pain Descriptors / Indicators  Aching    Pain Type  Chronic pain    Pain Onset  More than a month ago    Multiple Pain Sites  No         Treatment:  Nu-step x 5 mins (warm up)  Gait training  with rollator 165 feet with CGA   Sit to stand x 10 with vc for sequencing and safety cues and for correct hand placement.  Patient performs static standing with RW x 45 sec x 3 stands   Patient has fatigue after sx 3 tanding for 45 seconds     CGA and  max verbal cues used throughout Continues to have balance deficits typical with diagnosis. Patient performs intermediate level exercises without pain behaviors and needs verbal cuing for postural alignment and head positioning Tactile cues and assistance needed to keep trunk forward during transfers.                  PT Education - 09/19/17 1501    Education Details  sit to stand training    Person(s) Educated  Patient    Methods  Explanation;Demonstration;Tactile cues    Comprehension  Verbalized understanding;Verbal cues required;Returned demonstration       PT Short Term Goals - 09/12/17 1423      PT SHORT TERM GOAL #1   Title  Patient will be independent in home  exercise program to improve strength/mobility for better functional independence with ADLs.    Time  4    Period  Weeks    Status  On-going    Target Date  12/03/17      PT SHORT TERM GOAL #2   Title  Patient will perofrm sit to stand with correct hand placement and without support on the back of the chair with RW  and SBA.     Baseline  Patient needs max assist and needs max assist for correct hand placement.    Time  4    Period  Weeks    Status  Unable to assess    Target Date  12/03/17      PT SHORT TERM GOAL #3   Title  Patient will stand for 1 minute with UE support to be able to perform safe transfers.     Baseline  Patient can stand for 10 seconds    Time  4    Period  Weeks    Status  New        PT Long Term Goals - 09/12/17 1422      PT LONG TERM GOAL #1   Title  Patient will be able to ambulate in parallel bars x 10 feet with min assist.    Baseline  ; Patient is ambulating wiht rollator 25 feet and min assist    Time  12     Period  Weeks    Status  Partially Met    Target Date  12/03/17      PT LONG TERM GOAL #2   Title  Patient will ambulate short distances ,25 feet, with RW and mod assist.     Baseline  : 08/22/17 patient is ambulating 15 feet with RW:09/12/17 25 feet with min assist and rollator    Time  12    Period  Weeks    Status  Partially Met    Target Date  12/03/17      PT LONG TERM GOAL #3   Title  Patient will be able to ascend 1 step with min assist with LARD.    Baseline  max assist with HHA    Time  12    Period  Weeks    Status  Partially Met    Target Date  12/03/17            Plan - 09/19/17 1501    Clinical Impression Statement  Patient performs sit to stand transfer wiht max verbal cuing for leaning fwd and correct hand placement. She performs gait training with rollator for 160 feet with CGA. She has decreased motor planning and gets frozen during transfer training. She will conitineu to benefit from skilled PT to improve mobility and strength.     Rehab Potential  Good    PT Frequency  2x / week    PT Duration  12 weeks    PT Treatment/Interventions  Therapeutic exercise;Therapeutic activities;Functional mobility training;Stair training;Balance training;Neuromuscular re-education;Patient/family education;Aquatic Therapy    PT Next Visit Plan  mobility training, strengthening, balanace training    PT Home Exercise Plan  LAQ, hip flex    Consulted and Agree with Plan of Care  Patient;Family member/caregiver       Patient will benefit from skilled therapeutic intervention in order to improve the following deficits and impairments:  Decreased balance, Decreased endurance, Decreased mobility, Decreased safety awareness, Decreased strength, Decreased activity tolerance  Visit Diagnosis: Muscle weakness (generalized)  Other lack of  coordination  Difficulty in walking, not elsewhere classified  Pain and swelling of right lower extremity  Parkinson's disease  [facility])     Problem List Patient Active Problem List   Diagnosis Date Noted  . Pain and swelling of right lower extremity 01/02/2017  . Parkinson's disease ([place]) 01/02/2017    Alanson Puls, PT DPT 09/19/2017, 3:09 PM  [place] MAIN [facility] SERVICES [address] [place], [place], [address] Phone: [phone]   Fax:  [phone]  Name: Taunya Goral MRN: [id number] Date of Birth: [birth year]

## 2017-09-19 NOTE — Therapy (Signed)
[place] MAIN [facility] SERVICES [address] [place], [place], [address] Phone: [phone]   Fax:  [phone]  Occupational Therapy Treatment  Patient Details  Name: Kelsey Owens MRN: [id number] Date of Birth: 11-25-1940 Referring Provider: Jennelle Human   Encounter Date: 09/19/2017  OT End of Session - 09/19/17 1359    Visit Number  10    Number of Visits  24    Date for OT Re-Evaluation  10/10/17    Authorization Time Period  Visit 8 of 10 for progress report period starting 07/18/2017    OT Start Time  1353    Activity Tolerance  Patient tolerated treatment well    Behavior During Therapy  [facility] for tasks assessed/performed       Past Medical History:  Diagnosis Date  . Arthritis   . GERD (gastroesophageal reflux disease)   . Parkinson disease [facility])     Past Surgical History:  Procedure Laterality Date  . ABDOMINAL HYSTERECTOMY    . CHOLECYSTECTOMY    . DEEP BRAIN STIMULATOR PLACEMENT    . HEMORROIDECTOMY      There were no vitals filed for this visit.  Subjective Assessment - 09/19/17 1358    Subjective   Patient late today due to rain and inclement weather. Has been using putty at home for hand exercises.     Patient is accompained by:  Family member    Pertinent History  Pt. is a 77 y.o. female with a history of Parkinsons Disease. Pt. has a Deep Brain Stimulator, and has recently had it adjusted. Pt. has had multiple falls, and has a history of multiple wrist fractures.     Patient Stated Goals  To be as independent as possible.     Currently in Pain?  No/denies    Pain Score  0-No pain         Patient seen for right hand and digit strengthening with red putty, pinching, forming small balls and flattening with index and thumb, 2 point pinch with cues.  Manipulation of glass beads with right hand, cues for prehension patterns to pick up, moderate cues to move to the palm, using the hand for storage and then dropping  one by one into  Container with translatory movements.  Finger strengthening with push pins into moderate resistive board with right hand, cues for prehension patterns and to push in all the way.  Removing without difficulty.     Handwriting with right dominant hand with regular pen, print and script, micrographia present, cues to print with letters reaching top to bottom of the lines.                OT Education - 09/19/17 1359    Education Details  fine motor skills     Person(s) Educated  Patient    Methods  Explanation;Demonstration    Comprehension  Verbalized understanding;Returned demonstration          OT Long Term Goals - 07/18/17 1739      OT LONG TERM GOAL #1   Title  Pt. will improve BUE strength by 2 muscle grades to improve ADL, and IADL functioning.    Baseline  Eval: Limited BUE strength: Refer to flowsheet for detailed objective measurements.    Time  12    Period  Weeks    Status  New    Target Date  10/10/17      OT LONG TERM GOAL #2   Title  Pt.  will increase Left grip strength by 5# to be able to open jars, and containers.    Baseline  Eval: Limited grip strength    Time  12    Period  Weeks    Status  New    Target Date  10/10/17      OT LONG TERM GOAL #3   Title  Pt. will improve left hand [facility] skills  to be able to take items (cards/change) out of a wallet.    Baseline  Eval: limited [facility] skills    Time  12    Period  Weeks    Status  New    Target Date  10/10/17      OT LONG TERM GOAL #4   Title  Pt. will independently write one sentence efficiently with 75% legibility.    Baseline  Eval: 25% legibility for name only.    Time  12    Period  Weeks    Status  New    Target Date  10/10/17      OT LONG TERM GOAL #5   Title  Pt. independently demonstrate work simplification strategies, and compensatory startegies for safe IADL tasks/home management tasks.       Baseline  Eval: Pt. has difficulty    Time  12    Period  Weeks     Status  New    Target Date  10/10/17      OT LONG TERM GOAL #6   Title  Pt. will independently demonstrate work simplification strategies, and compensatory startegies within the kitchen.    Baseline  Eval: Pt. is unable    Time  12    Period  Weeks    Status  New    Target Date  10/10/17            Plan - 09/19/17 1406    Occupational Profile and client history currently impacting functional performance  Pt. resides with her husband who is a Theme park manager. Pt. has grown children.    Occupational performance deficits (Please refer to evaluation for details):  ADL's;IADL's    Current Impairments/barriers affecting progress:  Positive Barriers: age, motivation, family support. Negative Barriers: multiple comorbidities, history of multiple falls.    OT Frequency  2x / week    OT Duration  12 weeks    OT Treatment/Interventions  Self-care/ADL training;Neuromuscular education;Therapeutic exercise;DME and/or AE instruction;Visual/perceptual remediation/compensation;Patient/family education;Therapeutic activities    Consulted and Agree with Plan of Care  Patient       Patient will benefit from skilled therapeutic intervention in order to improve the following deficits and impairments:  Abnormal gait, Decreased cognition, Pain, Decreased strength, Impaired UE functional use, Decreased balance, Decreased activity tolerance  Visit Diagnosis: Muscle weakness (generalized)  Other lack of coordination    Problem List Patient Active Problem List   Diagnosis Date Noted  . Pain and swelling of right lower extremity 01/02/2017  . Parkinson's disease ([place]) 01/02/2017   Todrick Siedschlag T Tomasita Morrow, OTR/L, CLT  Eesha Schmaltz 09/19/2017, 2:07 PM  [place] MAIN [facility] SERVICES [address] [place], [place], [address] Phone: [phone]   Fax:  [phone]  Name: Kelsey Owens MRN: [id number] Date of Birth: [birth year]

## 2017-09-24 ENCOUNTER — Encounter: Payer: Self-pay | Admitting: Physical Therapy

## 2017-09-24 ENCOUNTER — Ambulatory Visit: Payer: Medicare Other | Admitting: Physical Therapy

## 2017-09-24 DIAGNOSIS — G20A1 Parkinson's disease without dyskinesia, without mention of fluctuations: Secondary | ICD-10-CM

## 2017-09-24 DIAGNOSIS — M6281 Muscle weakness (generalized): Secondary | ICD-10-CM

## 2017-09-24 DIAGNOSIS — M79604 Pain in right leg: Secondary | ICD-10-CM

## 2017-09-24 DIAGNOSIS — M7989 Other specified soft tissue disorders: Secondary | ICD-10-CM

## 2017-09-24 DIAGNOSIS — G20 Parkinson's disease: Secondary | ICD-10-CM

## 2017-09-24 DIAGNOSIS — R278 Other lack of coordination: Secondary | ICD-10-CM

## 2017-09-24 DIAGNOSIS — R262 Difficulty in walking, not elsewhere classified: Secondary | ICD-10-CM

## 2017-09-24 NOTE — Therapy (Addendum)
[place] MAIN [facility] SERVICES [address] [place], [place], [address] Phone: [phone]   Fax:  [phone]  Physical Therapy Treatment  Patient Details  Name: Kelsey Owens MRN: [id number] Date of Birth: January 30, 1941 Referring Provider: Jennelle Human   Encounter Date: 09/24/2017  PT End of Session - 09/24/17 1405    Visit Number  15    Number of Visits  33    Date for PT Re-Evaluation  12/03/17    PT Start Time  0150    PT Stop Time  0230    PT Time Calculation (min)  40 min    Equipment Utilized During Treatment  Gait belt    Activity Tolerance  Patient tolerated treatment well    Behavior During Therapy  [facility] for tasks assessed/performed       Past Medical History:  Diagnosis Date  . Arthritis   . GERD (gastroesophageal reflux disease)   . Parkinson disease [facility])     Past Surgical History:  Procedure Laterality Date  . ABDOMINAL HYSTERECTOMY    . CHOLECYSTECTOMY    . DEEP BRAIN STIMULATOR PLACEMENT    . HEMORROIDECTOMY      There were no vitals filed for this visit. Treatment:    Transfer training from wc to nu-step; nu-step to wc; wc to stand x 5 in parallel bars with min assist and max cues for sequencing due to poor motor control and also due to poor posture.  Gait training with rollator and min assist and better motor control RLE and better knee extension during stance phase of gait and use of BUE to assist x  85 feet , 45 feet with low fatigue   Therapeutic exercise: Leg press 75 lbs x 15 x 3, heel raises x 15 x 2 Standing hip abd x 10 BLE  Standing hip ext x 10 BLE  Patient has better  posture during exercises and has less difficulty standing on single leg to perform standing exercises without min assist and parallel bars  Patient reports that her leg does not hurt during treatment today                         PT Education - 09/24/17 1358    Education Details  HEP    Person(s)  Educated  Patient    Methods  Explanation;Demonstration    Comprehension  Verbalized understanding;Returned demonstration       PT Short Term Goals - 09/12/17 1423      PT SHORT TERM GOAL #1   Title  Patient will be independent in home exercise program to improve strength/mobility for better functional independence with ADLs.    Time  4    Period  Weeks    Status  On-going    Target Date  12/03/17      PT SHORT TERM GOAL #2   Title  Patient will perofrm sit to stand with correct hand placement and without support on the back of the chair with RW  and SBA.     Baseline  Patient needs max assist and needs max assist for correct hand placement.    Time  4    Period  Weeks    Status  Unable to assess    Target Date  12/03/17      PT SHORT TERM GOAL #3   Title  Patient will stand for 1 minute with UE support to be able to perform safe transfers.  Baseline  Patient can stand for 10 seconds    Time  4    Period  Weeks    Status  New        PT Long Term Goals - 09/12/17 1422      PT LONG TERM GOAL #1   Title  Patient will be able to ambulate in parallel bars x 10 feet with min assist.    Baseline  ; Patient is ambulating wiht rollator 25 feet and min assist    Time  12    Period  Weeks    Status  Partially Met    Target Date  12/03/17      PT LONG TERM GOAL #2   Title  Patient will ambulate short distances ,25 feet, with RW and mod assist.     Baseline  : 08/22/17 patient is ambulating 15 feet with RW:09/12/17 25 feet with min assist and rollator    Time  12    Period  Weeks    Status  Partially Met    Target Date  12/03/17      PT LONG TERM GOAL #3   Title  Patient will be able to ascend 1 step with min assist with LARD.    Baseline  max assist with HHA    Time  12    Period  Weeks    Status  Partially Met    Target Date  12/03/17            Plan - 09/24/17 1407    Clinical Impression Statement  Patient demonstrates improved stability and strength allowing  patient to perform short duration standing interventions with rest periods. Patient performs beginning gait training to shift weight and perform standing activities with min assist. Patient fatigues quickly with exercises requiring rest breaks at this time. Patient will continue to benefit from skilled physical therapy to improve pain and mobility    Rehab Potential  Good    PT Frequency  2x / week    PT Duration  12 weeks    PT Treatment/Interventions  Therapeutic exercise;Therapeutic activities;Functional mobility training;Stair training;Balance training;Neuromuscular re-education;Patient/family education;Aquatic Therapy    PT Next Visit Plan  mobility training, strengthening, balanace training    PT Home Exercise Plan  LAQ, hip flex    Consulted and Agree with Plan of Care  Patient;Family member/caregiver       Patient will benefit from skilled therapeutic intervention in order to improve the following deficits and impairments:  Decreased balance, Decreased endurance, Decreased mobility, Decreased safety awareness, Decreased strength, Decreased activity tolerance  Visit Diagnosis: Muscle weakness (generalized)  Other lack of coordination  Difficulty in walking, not elsewhere classified  Pain and swelling of right lower extremity  Parkinson's disease [facility])     Problem List Patient Active Problem List   Diagnosis Date Noted  . Pain and swelling of right lower extremity 01/02/2017  . Parkinson's disease ([place]) 01/02/2017    Alanson Puls , PT DPT 09/25/2017, 10:57 AM  [place] MAIN [facility] SERVICES [address] [place], [place], [address] Phone: [phone]   Fax:  [phone]  Name: Kelsey Owens MRN: [id number] Date of Birth: 23-Nov-1940

## 2017-09-26 ENCOUNTER — Encounter: Payer: Self-pay | Admitting: Occupational Therapy

## 2017-09-26 ENCOUNTER — Ambulatory Visit: Payer: Medicare Other | Admitting: Occupational Therapy

## 2017-09-26 ENCOUNTER — Ambulatory Visit: Payer: Medicare Other

## 2017-09-26 VITALS — BP 125/54 | HR 98

## 2017-09-26 DIAGNOSIS — R278 Other lack of coordination: Secondary | ICD-10-CM

## 2017-09-26 DIAGNOSIS — M6281 Muscle weakness (generalized): Secondary | ICD-10-CM

## 2017-09-26 DIAGNOSIS — R262 Difficulty in walking, not elsewhere classified: Secondary | ICD-10-CM

## 2017-09-26 NOTE — Therapy (Signed)
[place] MAIN [facility] SERVICES [address] [place], [place], [address] Phone: [phone]   Fax:  [phone]  Physical Therapy Treatment  Patient Details  Name: Kelsey Owens MRN: [id number] Date of Birth: May 26, 1940 Referring Provider: Jennelle Human   Encounter Date: 09/26/2017  PT End of Session - 09/26/17 1711    Visit Number  16    Number of Visits  33    Date for PT Re-Evaluation  12/03/17    PT Start Time  1430    PT Stop Time  1515    PT Time Calculation (min)  45 min    Equipment Utilized During Treatment  Gait belt    Activity Tolerance  Patient tolerated treatment well    Behavior During Therapy  [facility] for tasks assessed/performed       Past Medical History:  Diagnosis Date  . Arthritis   . GERD (gastroesophageal reflux disease)   . Parkinson disease [facility])     Past Surgical History:  Procedure Laterality Date  . ABDOMINAL HYSTERECTOMY    . CHOLECYSTECTOMY    . DEEP BRAIN STIMULATOR PLACEMENT    . HEMORROIDECTOMY      Vitals:   09/26/17 1432  BP: (!) 125/54  Pulse: 98  SpO2: 98%    Subjective Assessment - 09/26/17 1432    Subjective  Pt reports that she is doing well today. She has had two falls since her last therapy visit. No injuries reported. She states that she was successfully treated for a bladder infection recently. No specific questions or concerns at this time.     Pertinent History  Patient has had parkinsons disease for the last 20 years. Her mobility has been getting worse after she had a left radius fx.  She was walking with RW in 10/18.  Patient has worseing loss of balance beginning 10/18.  She had HHPT 2 x week for 2 months beginining after hospitilization in Dec 2018.     Patient Stated Goals  Patient wants to be able to walk better and not fall as often    Currently in Pain?  Yes    Pain Score  3     Pain Location  Hip    Pain Orientation  Right    Pain Descriptors / Indicators  Throbbing     Pain Type  Chronic pain    Pain Onset  More than a month ago              TREATMENT  Ther-ex  NuStep L2 x 4 minutes for strength and endurance, fatigue monitored throughout; Sit to stand in parallel bars from regular height chair with Airex pad on seat 2 x 5, heavy external cues for anterior trunk lean. Improved sequencing with repetition however pt continues to have significant postierior trunk lean; Standing high knee hip marching x 15 bilateral with UE support; Standing hip abduction x 15 bilateral with UE support; Seated clams with manual resistance x 15; Leg press 75# x 20, 90# x 20, Gait trainingwith front wheeled walker intermittently when moving between NuStep and // bars and then // bars and leg press. Pt requires moderate cues for proper weight shift and to stand within confines of walker. Min/modA+1 support for balance;                          PT Education - 09/26/17 1433    Education Details  Exercise form/technique    Person(s)  Educated  Patient    Methods  Explanation    Comprehension  Verbalized understanding       PT Short Term Goals - 09/12/17 1423      PT SHORT TERM GOAL #1   Title  Patient will be independent in home exercise program to improve strength/mobility for better functional independence with ADLs.    Time  4    Period  Weeks    Status  On-going    Target Date  12/03/17      PT SHORT TERM GOAL #2   Title  Patient will perofrm sit to stand with correct hand placement and without support on the back of the chair with RW  and SBA.     Baseline  Patient needs max assist and needs max assist for correct hand placement.    Time  4    Period  Weeks    Status  Unable to assess    Target Date  12/03/17      PT SHORT TERM GOAL #3   Title  Patient will stand for 1 minute with UE support to be able to perform safe transfers.     Baseline  Patient can stand for 10 seconds    Time  4    Period  Weeks    Status  New         PT Long Term Goals - 09/12/17 1422      PT LONG TERM GOAL #1   Title  Patient will be able to ambulate in parallel bars x 10 feet with min assist.    Baseline  ; Patient is ambulating wiht rollator 25 feet and min assist    Time  12    Period  Weeks    Status  Partially Met    Target Date  12/03/17      PT LONG TERM GOAL #2   Title  Patient will ambulate short distances ,25 feet, with RW and mod assist.     Baseline  : 08/22/17 patient is ambulating 15 feet with RW:09/12/17 25 feet with min assist and rollator    Time  12    Period  Weeks    Status  Partially Met    Target Date  12/03/17      PT LONG TERM GOAL #3   Title  Patient will be able to ascend 1 step with min assist with LARD.    Baseline  max assist with HHA    Time  12    Period  Weeks    Status  Partially Met    Target Date  12/03/17            Plan - 09/26/17 1711    Clinical Impression Statement  Pt with excellent motivation during therapy session today. She requires repeated cues for adequate anterior trunk lean to perform safe sit to stand transfers. She demonstrates some bilateral LE buckling during gait in gym with rolling walker when moving between surfaces. Patient will continue to benefit from skilled physical therapy to improve pain and mobility    Rehab Potential  Good    PT Frequency  2x / week    PT Duration  12 weeks    PT Treatment/Interventions  Therapeutic exercise;Therapeutic activities;Functional mobility training;Stair training;Balance training;Neuromuscular re-education;Patient/family education;Aquatic Therapy    PT Next Visit Plan  mobility training, strengthening, balanace training    PT Home Exercise Plan  LAQ, hip flex    Consulted and Agree with Plan of  Care  Patient;Family member/caregiver       Patient will benefit from skilled therapeutic intervention in order to improve the following deficits and impairments:  Decreased balance, Decreased endurance, Decreased mobility,  Decreased safety awareness, Decreased strength, Decreased activity tolerance  Visit Diagnosis: Muscle weakness (generalized)  Difficulty in walking, not elsewhere classified     Problem List Patient Active Problem List   Diagnosis Date Noted  . Pain and swelling of right lower extremity 01/02/2017  . Parkinson's disease ([place]) 01/02/2017   Phillips Grout PT, DPT, GCS  Keyera Hattabaugh 09/26/2017, 5:16 PM  [place] MAIN [facility] SERVICES [address] [place], [place], [address] Phone: [phone]   Fax:  [phone]  Name: Kelsey Owens MRN: [id number] Date of Birth: 1940/08/29

## 2017-09-26 NOTE — Therapy (Signed)
[place] MAIN [facility] SERVICES [address] [place], [place], [address] Phone: [phone]   Fax:  [phone]  Occupational Therapy Treatment  Patient Details  Name: Kelsey Owens MRN: [id number] Date of Birth: Jun 07, 1940 Referring Provider: Jennelle Human   Encounter Date: 09/26/2017  OT End of Session - 09/26/17 1400    Visit Number  11    Number of Visits  24    Date for OT Re-Evaluation  10/10/17    Authorization Time Period  Visit 9 of 10 for progress report period starting 07/18/2017    OT Start Time  1347    OT Stop Time  1430    OT Time Calculation (min)  43 min    Activity Tolerance  Patient tolerated treatment well    Behavior During Therapy  [facility] for tasks assessed/performed       Past Medical History:  Diagnosis Date  . Arthritis   . GERD (gastroesophageal reflux disease)   . Parkinson disease [facility])     Past Surgical History:  Procedure Laterality Date  . ABDOMINAL HYSTERECTOMY    . CHOLECYSTECTOMY    . DEEP BRAIN STIMULATOR PLACEMENT    . HEMORROIDECTOMY      There were no vitals filed for this visit.  Subjective Assessment - 09/26/17 1357    Subjective   Pt. reports that the theraputty at home stuck to her tablecloth.    Patient is accompained by:  Family member    Pertinent History  Pt. is a 77 y.o. female with a history of Parkinsons Disease. Pt. has a Deep Brain Stimulator, and has recently had it adjusted. Pt. has had multiple falls, and has a history of multiple wrist fractures.     Patient Stated Goals  To be as independent as possible.       OT TREATMENT    Neuro muscular re-education:  Pt. performed [facility] skills training to improve speed and dexterity needed for ADL tasks and writing. Pt. demonstrated grasping 1 inch sticks,  inch cylindrical collars, and  inch flat washers on the Purdue pegboard. Pt. performed grasping each item with her 2nd digit and thumb. Pt. required consistent verbal cues for  redirection, and hand patterns. Pt. worked on grasping, and sorting various sized of earring backings.                          OT Education - 09/26/17 1359    Education Details  [facility] skills    Person(s) Educated  Patient    Methods  Explanation;Demonstration    Comprehension  Verbalized understanding;Returned demonstration          OT Long Term Goals - 07/18/17 1739      OT LONG TERM GOAL #1   Title  Pt. will improve BUE strength by 2 muscle grades to improve ADL, and IADL functioning.    Baseline  Eval: Limited BUE strength: Refer to flowsheet for detailed objective measurements.    Time  12    Period  Weeks    Status  New    Target Date  10/10/17      OT LONG TERM GOAL #2   Title  Pt. will increase Left grip strength by 5# to be able to open jars, and containers.    Baseline  Eval: Limited grip strength    Time  12    Period  Weeks    Status  New    Target Date  10/10/17      OT LONG TERM GOAL #3   Title  Pt. will improve left hand [facility] skills  to be able to take items (cards/change) out of a wallet.    Baseline  Eval: limited [facility] skills    Time  12    Period  Weeks    Status  New    Target Date  10/10/17      OT LONG TERM GOAL #4   Title  Pt. will independently write one sentence efficiently with 75% legibility.    Baseline  Eval: 25% legibility for name only.    Time  12    Period  Weeks    Status  New    Target Date  10/10/17      OT LONG TERM GOAL #5   Title  Pt. independently demonstrate work simplification strategies, and compensatory startegies for safe IADL tasks/home management tasks.       Baseline  Eval: Pt. has difficulty    Time  12    Period  Weeks    Status  New    Target Date  10/10/17      OT LONG TERM GOAL #6   Title  Pt. will independently demonstrate work simplification strategies, and compensatory startegies within the kitchen.    Baseline  Eval: Pt. is unable    Time  12    Period  Weeks    Status  New     Target Date  10/10/17            Plan - 09/26/17 1400    Clinical Impression Statement  Pt. presents with limited UE strength, and [facility] skills. Pt. reports that she is improving with motor control, and fine coordination skills during tasks at home.  Pt. continues to present with impaired motor control, and coordination skills.     Occupational Profile and client history currently impacting functional performance  Pt. resides with her husband who is a Theme park manager. Pt. has grown children.    Occupational performance deficits (Please refer to evaluation for details):  ADL's;IADL's    Current Impairments/barriers affecting progress:  Positive Barriers: age, motivation, family support. Negative Barriers: multiple comorbidities, history of multiple falls.    OT Frequency  2x / week    OT Duration  12 weeks    OT Treatment/Interventions  Self-care/ADL training;Neuromuscular education;Therapeutic exercise;DME and/or AE instruction;Visual/perceptual remediation/compensation;Patient/family education;Therapeutic activities    Clinical Decision Making  Several treatment options, min-mod task modification necessary    Consulted and Agree with Plan of Care  Patient       Patient will benefit from skilled therapeutic intervention in order to improve the following deficits and impairments:  Abnormal gait, Decreased cognition, Pain, Decreased strength, Impaired UE functional use, Decreased activity tolerance, Decreased balance  Visit Diagnosis: Muscle weakness (generalized)  Other lack of coordination    Problem List Patient Active Problem List   Diagnosis Date Noted  . Pain and swelling of right lower extremity 01/02/2017  . Parkinson's disease ([place]) 01/02/2017    Harrel Carina, MS, OTR/L 09/26/2017, 2:16 PM  [place] MAIN [facility] SERVICES [address] [place], [place], [address] Phone: [phone]   Fax:  [phone]  Name: Vieva Brummitt MRN:  [id number] Date of Birth: [birth year]

## 2017-10-01 ENCOUNTER — Encounter: Payer: Self-pay | Admitting: Occupational Therapy

## 2017-10-01 ENCOUNTER — Ambulatory Visit: Payer: Medicare Other | Admitting: Physical Therapy

## 2017-10-01 ENCOUNTER — Encounter: Payer: Self-pay | Admitting: Physical Therapy

## 2017-10-01 ENCOUNTER — Ambulatory Visit: Payer: Medicare Other | Admitting: Occupational Therapy

## 2017-10-01 DIAGNOSIS — M79604 Pain in right leg: Secondary | ICD-10-CM

## 2017-10-01 DIAGNOSIS — R278 Other lack of coordination: Secondary | ICD-10-CM

## 2017-10-01 DIAGNOSIS — R262 Difficulty in walking, not elsewhere classified: Secondary | ICD-10-CM

## 2017-10-01 DIAGNOSIS — G20A1 Parkinson's disease without dyskinesia, without mention of fluctuations: Secondary | ICD-10-CM

## 2017-10-01 DIAGNOSIS — M6281 Muscle weakness (generalized): Secondary | ICD-10-CM | POA: Diagnosis not present

## 2017-10-01 DIAGNOSIS — M7989 Other specified soft tissue disorders: Secondary | ICD-10-CM

## 2017-10-01 DIAGNOSIS — G20 Parkinson's disease: Secondary | ICD-10-CM

## 2017-10-01 NOTE — Therapy (Signed)
[place] MAIN [facility] SERVICES [address] [place], [place], [address] Phone: [phone]   Fax:  [phone]  Occupational Therapy Progress Note  Dates of reporting period  07/18/2017   to   10/01/2017  Patient Details  Name: Kelsey Owens MRN: [id number] Date of Birth: [birth year] Referring Provider: Jennelle Human   Encounter Date: 10/01/2017  OT End of Session - 10/01/17 1459    Visit Number  12    Number of Visits  24    Date for OT Re-Evaluation  10/10/17    Authorization Time Period  Visit 10 of 10 for progress report period starting 07/18/2017    OT Start Time  1452    OT Stop Time  1530    OT Time Calculation (min)  38 min    Activity Tolerance  Patient tolerated treatment well    Behavior During Therapy  [facility] for tasks assessed/performed       Past Medical History:  Diagnosis Date  . Arthritis   . GERD (gastroesophageal reflux disease)   . Parkinson disease [facility])     Past Surgical History:  Procedure Laterality Date  . ABDOMINAL HYSTERECTOMY    . CHOLECYSTECTOMY    . DEEP BRAIN STIMULATOR PLACEMENT    . HEMORROIDECTOMY      There were no vitals filed for this visit.  Subjective Assessment - 10/01/17 1458    Subjective   Pt. reports that she is doing well.    Patient is accompained by:  Family member    Pertinent History  Pt. is a 77 y.o. female with a history of Parkinsons Disease. Pt. has a Deep Brain Stimulator, and has recently had it adjusted. Pt. has had multiple falls, and has a history of multiple wrist fractures.     Currently in Pain?  Yes    Pain Score  8     Pain Location  Shoulder    Pain Orientation  Right      OT TREATMENT    Neuro muscular re-education:  Pt. worked on [facility] skills manipulating small magnetic pushpins, and placing them onto a vertical Nurse, children's. Pt. worked on Copy, and Engineer, site. Pt. worked on writing longer words with emphasis on size,  and spacing. Pt. Was able to maintain a mature tripod grasp on a standard pen throughout writing.                           OT Education - 10/01/17 1459    Education Details  [facility] skills    Person(s) Educated  Patient    Methods  Explanation;Demonstration    Comprehension  Verbalized understanding;Returned demonstration          OT Long Term Goals - 10/01/17 1508      OT LONG TERM GOAL #1   Title  Pt. will improve BUE strength by 2 muscle grades to improve ADL, and IADL functioning.    Baseline  continue    Time  12    Period  Weeks    Status  On-going    Target Date  10/10/17      OT LONG TERM GOAL #2   Title  Pt. will increase Left grip strength by 5# to be able to open jars, and containers.    Baseline  Limited grip strength    Time  12    Period  Weeks    Status  On-going  Target Date  10/10/17      OT LONG TERM GOAL #3   Title  Pt. will improve left hand [facility] skills  to be able to take items (cards/change) out of a wallet.    Baseline  Limited [facility] skills    Time  12    Period  Weeks    Status  On-going    Target Date  10/10/17      OT LONG TERM GOAL #4   Title  Pt. will independently write one sentence efficiently with 75% legibility.    Baseline  25% legibility for name only.    Time  12    Period  Weeks    Status  On-going    Target Date  10/10/17      OT LONG TERM GOAL #5   Title  Pt. independently demonstrate work simplification strategies, and compensatory startegies for safe IADL tasks/home management tasks.       Baseline  Pt. continues to have difficulty    Time  12    Period  Weeks    Status  On-going    Target Date  10/10/17      OT LONG TERM GOAL #6   Title  Pt. will independently demonstrate work simplification strategies, and compensatory startegies within the kitchen.    Baseline  Continue    Time  12    Period  Weeks    Status  On-going    Target Date  10/10/17            Plan - 10/01/17 1500     Clinical Impression Statement  Pt. is now able to assist more with toileting tasks at home. Pt. continues to have difficulty opening jars, manipulating objects, and writing. Pt. continues to work on improving UE strength, and [facility] skills in order to improve arm, and hand function during ADLS, and IADLs.    Occupational Profile and client history currently impacting functional performance  Pt. resides with her husband who is a Theme park manager. Pt. has grown children.    Occupational performance deficits (Please refer to evaluation for details):  ADL's;IADL's    Current Impairments/barriers affecting progress:  Positive Barriers: age, motivation, family support. Negative Barriers: multiple comorbidities, history of multiple falls.    OT Frequency  2x / week    OT Duration  12 weeks    OT Treatment/Interventions  Self-care/ADL training;Neuromuscular education;Therapeutic exercise;DME and/or AE instruction;Visual/perceptual remediation/compensation;Patient/family education;Therapeutic activities    Clinical Decision Making  Several treatment options, min-mod task modification necessary    Consulted and Agree with Plan of Care  Patient       Patient will benefit from skilled therapeutic intervention in order to improve the following deficits and impairments:  Abnormal gait, Decreased cognition, Pain, Decreased strength, Impaired UE functional use, Decreased activity tolerance, Decreased balance  Visit Diagnosis: Muscle weakness (generalized)    Problem List Patient Active Problem List   Diagnosis Date Noted  . Pain and swelling of right lower extremity 01/02/2017  . Parkinson's disease ([place]) 01/02/2017    Harrel Carina, MS, OTR/L 10/01/2017, 3:14 PM  [place] MAIN [facility] SERVICES [address] [place], [place], [address] Phone: [phone]   Fax:  [phone]  Name: Kelsey Owens MRN: [id number] Date of Birth: Sep 25, 1940

## 2017-10-01 NOTE — Therapy (Addendum)
[place] MAIN [facility] SERVICES [address] [place], [place], [address] Phone: [phone]   Fax:  [phone]  Physical Therapy Treatment  Patient Details  Name: Kelsey Owens MRN: [id number] Date of Birth: November 26, 1940 Referring Provider: Jennelle Human   Encounter Date: 10/01/2017  PT End of Session - 10/01/17 1433    Visit Number  17    Number of Visits  33    Date for PT Re-Evaluation  12/03/17    PT Start Time  0200    PT Stop Time  0240    PT Time Calculation (min)  40 min    Equipment Utilized During Treatment  Gait belt    Activity Tolerance  Patient tolerated treatment well    Behavior During Therapy  [facility] for tasks assessed/performed       Past Medical History:  Diagnosis Date  . Arthritis   . GERD (gastroesophageal reflux disease)   . Parkinson disease [facility])     Past Surgical History:  Procedure Laterality Date  . ABDOMINAL HYSTERECTOMY    . CHOLECYSTECTOMY    . DEEP BRAIN STIMULATOR PLACEMENT    . HEMORROIDECTOMY      There were no vitals filed for this visit.  Subjective Assessment - 10/01/17 1430    Subjective  Pt reports that she is having 8/10 pain in her left shoulder today.   No specific questions or concerns at this time.     Patient is accompained by:  Family member    Pertinent History  Patient has had parkinsons disease for the last 20 years. Her mobility has been getting worse after she had a left radius fx.  She was walking with RW in 10/18.  Patient has worseing loss of balance beginning 10/18.  She had HHPT 2 x week for 2 months beginining after hospitilization in Dec 2018.     Patient Stated Goals  Patient wants to be able to walk better and not fall as often    Currently in Pain?  Yes    Pain Score  8     Pain Location  Shoulder    Pain Orientation  Right    Pain Descriptors / Indicators  Aching    Pain Onset  More than a month ago    Pain Frequency  Intermittent    Aggravating Factors   weight  bearing    Pain Relieving Factors  nothing    Effect of Pain on Daily Activities  inability to use her right UE    Multiple Pain Sites  No       Treatment:  Nu-step x 5 mins (warm up)  Gait training with rollator 165 feet with CGA   Sit to stand x 10 with vc for sequencing and safety cues and for correct hand placement.  Patient performs static standing with RW x 45 sec x 3 stands   Patient has fatigue after sx 3 tanding for 45 seconds   CGA and  max verbal cues used throughout Continues to have balance deficits typical with diagnosis. Patient performs intermediate level exercises without pain behaviors and needs verbal cuing for postural alignment and head positioning Tactile cues and assistance needed to keep trunk forward during transfers.                        PT Education - 10/01/17 1433    Education Details  HEP    Person(s) Educated  Patient    Methods  Explanation    Comprehension  Verbalized understanding;Returned demonstration       PT Short Term Goals - 09/12/17 1423      PT SHORT TERM GOAL #1   Title  Patient will be independent in home exercise program to improve strength/mobility for better functional independence with ADLs.    Time  4    Period  Weeks    Status  On-going    Target Date  12/03/17      PT SHORT TERM GOAL #2   Title  Patient will perofrm sit to stand with correct hand placement and without support on the back of the chair with RW  and SBA.     Baseline  Patient needs max assist and needs max assist for correct hand placement.    Time  4    Period  Weeks    Status  Unable to assess    Target Date  12/03/17      PT SHORT TERM GOAL #3   Title  Patient will stand for 1 minute with UE support to be able to perform safe transfers.     Baseline  Patient can stand for 10 seconds    Time  4    Period  Weeks    Status  New        PT Long Term Goals - 09/12/17 1422      PT LONG TERM GOAL #1   Title  Patient will be  able to ambulate in parallel bars x 10 feet with min assist.    Baseline  ; Patient is ambulating wiht rollator 25 feet and min assist    Time  12    Period  Weeks    Status  Partially Met    Target Date  12/03/17      PT LONG TERM GOAL #2   Title  Patient will ambulate short distances ,25 feet, with RW and mod assist.     Baseline  : 08/22/17 patient is ambulating 15 feet with RW:09/12/17 25 feet with min assist and rollator    Time  12    Period  Weeks    Status  Partially Met    Target Date  12/03/17      PT LONG TERM GOAL #3   Title  Patient will be able to ascend 1 step with min assist with LARD.    Baseline  max assist with HHA    Time  12    Period  Weeks    Status  Partially Met    Target Date  12/03/17            Plan - 10/01/17 1434    Clinical Impression Statement  Patient demonstrates LE and core weakness, and impaired mobility and lack of LE strength that is causing difficulty with gait activities.  Patient still fatigues quickly to due weak  hip and ad and core muscles. Patient is able to perform exercises that were difficult several weeks ago. Patient will continue to benefit from skilled PT to improve with gait and mobility.     Rehab Potential  Good    PT Frequency  2x / week    PT Duration  12 weeks    PT Treatment/Interventions  Therapeutic exercise;Therapeutic activities;Functional mobility training;Stair training;Balance training;Neuromuscular re-education;Patient/family education;Aquatic Therapy    PT Next Visit Plan  mobility training, strengthening, balanace training    PT Home Exercise Plan  LAQ, hip flex    Consulted and Agree with Plan  of Care  Patient;Family member/caregiver       Patient will benefit from skilled therapeutic intervention in order to improve the following deficits and impairments:  Decreased balance, Decreased endurance, Decreased mobility, Decreased safety awareness, Decreased strength, Decreased activity tolerance  Visit  Diagnosis: Muscle weakness (generalized)  Difficulty in walking, not elsewhere classified  Other lack of coordination  Pain and swelling of right lower extremity  Parkinson's disease [facility])     Problem List Patient Active Problem List   Diagnosis Date Noted  . Pain and swelling of right lower extremity 01/02/2017  . Parkinson's disease ([place]) 01/02/2017    Alanson Puls, PT DPT 10/01/2017, 3:09 PM  [place] MAIN [facility] SERVICES [address] [place], [place], [address] Phone: [phone]   Fax:  [phone]  Name: Kelsey Owens MRN: [id number] Date of Birth: 1940-06-30

## 2017-10-03 ENCOUNTER — Ambulatory Visit: Payer: Medicare Other | Admitting: Physical Therapy

## 2017-10-03 ENCOUNTER — Encounter: Payer: Self-pay | Admitting: Occupational Therapy

## 2017-10-03 ENCOUNTER — Encounter: Payer: Self-pay | Admitting: Physical Therapy

## 2017-10-03 ENCOUNTER — Ambulatory Visit: Payer: Medicare Other | Admitting: Occupational Therapy

## 2017-10-03 DIAGNOSIS — M6281 Muscle weakness (generalized): Secondary | ICD-10-CM

## 2017-10-03 DIAGNOSIS — R490 Dysphonia: Secondary | ICD-10-CM

## 2017-10-03 DIAGNOSIS — M7989 Other specified soft tissue disorders: Secondary | ICD-10-CM

## 2017-10-03 DIAGNOSIS — G20 Parkinson's disease: Secondary | ICD-10-CM

## 2017-10-03 DIAGNOSIS — M79604 Pain in right leg: Secondary | ICD-10-CM

## 2017-10-03 DIAGNOSIS — R262 Difficulty in walking, not elsewhere classified: Secondary | ICD-10-CM

## 2017-10-03 DIAGNOSIS — R278 Other lack of coordination: Secondary | ICD-10-CM

## 2017-10-03 NOTE — Therapy (Signed)
[place] MAIN [facility] SERVICES [address] [place], [place], [address] Phone: [phone]   Fax:  [phone]  Occupational Therapy Treatment  Patient Details  Name: Kelsey Owens MRN: [id number] Date of Birth: 23-Dec-1940 Referring Provider: Jennelle Human   Encounter Date: 10/03/2017  OT End of Session - 10/03/17 1356    Visit Number  13    Number of Visits  24    Date for OT Re-Evaluation  10/10/17    Authorization Time Period  Visit 1 of 10 for progress report period starting 10/03/2017    OT Start Time  1345    OT Stop Time  1430    OT Time Calculation (min)  45 min    Activity Tolerance  Patient tolerated treatment well    Behavior During Therapy  [facility] for tasks assessed/performed       Past Medical History:  Diagnosis Date  . Arthritis   . GERD (gastroesophageal reflux disease)   . Parkinson disease [facility])     Past Surgical History:  Procedure Laterality Date  . ABDOMINAL HYSTERECTOMY    . CHOLECYSTECTOMY    . DEEP BRAIN STIMULATOR PLACEMENT    . HEMORROIDECTOMY      There were no vitals filed for this visit.  Subjective Assessment - 10/03/17 1355    Subjective   Pt. reports that she is doing well.    Patient is accompained by:  Family member    Pertinent History  Pt. is a 77 y.o. female with a history of Parkinsons Disease. Pt. has a Deep Brain Stimulator, and has recently had it adjusted. Pt. has had multiple falls, and has a history of multiple wrist fractures.     Patient Stated Goals  To be as independent as possible.     Currently in Pain?  Yes      OT TREATMENT    Neuro muscular re-education:  Pt. worked on grasping small magnetic pegs using a 2pt. grasp, and reaching up to place them on a vertical angle. Pt. worked on tasks to sustain lateral pinch on resistive tweezers while grasping and moving 2" toothpick sticks from a horizontal flat position to a vertical position in order to place it in the holder. Pt.  was able to sustain grasp while positioning and extending the wrist/hand to place them vertically into a container. The task was modified to placing the toothpicks horizontally in a larger container. Pt. worked on grasping, and prehension skills manipulating nuts, and bolts on a vertical tower.                          OT Education - 10/03/17 1356    Education Details  [facility] Skills    Person(s) Educated  Patient    Methods  Explanation;Demonstration    Comprehension  Verbalized understanding;Returned demonstration          OT Long Term Goals - 10/01/17 1508      OT LONG TERM GOAL #1   Title  Pt. will improve BUE strength by 2 muscle grades to improve ADL, and IADL functioning.    Baseline  continue    Time  12    Period  Weeks    Status  On-going    Target Date  10/10/17      OT LONG TERM GOAL #2   Title  Pt. will increase Left grip strength by 5# to be able to open jars, and containers.  Baseline  Limited grip strength    Time  12    Period  Weeks    Status  On-going    Target Date  10/10/17      OT LONG TERM GOAL #3   Title  Pt. will improve left hand [facility] skills  to be able to take items (cards/change) out of a wallet.    Baseline  Limited [facility] skills    Time  12    Period  Weeks    Status  On-going    Target Date  10/10/17      OT LONG TERM GOAL #4   Title  Pt. will independently write one sentence efficiently with 75% legibility.    Baseline  25% legibility for name only.    Time  12    Period  Weeks    Status  On-going    Target Date  10/10/17      OT LONG TERM GOAL #5   Title  Pt. independently demonstrate work simplification strategies, and compensatory startegies for safe IADL tasks/home management tasks.       Baseline  Pt. continues to have difficulty    Time  12    Period  Weeks    Status  On-going    Target Date  10/10/17      OT LONG TERM GOAL #6   Title  Pt. will independently demonstrate work simplification strategies, and  compensatory startegies within the kitchen.    Baseline  Continue    Time  12    Period  Weeks    Status  On-going    Target Date  10/10/17            Plan - 10/03/17 1357    Clinical Impression Statement  Pt. has made steady progress overall, and is engaging in more ADL, and IADL tasks at home. Pt. continues to work on improving motor control, coordination, bilateral hand coordination, and fine motor coordination skills in order to improve UE functining for ADLs, and IADLs. Pt. requires verbal cues for redirection.     Occupational Profile and client history currently impacting functional performance  Pt. resides with her husband who is a Theme park manager. Pt. has grown children.    Occupational performance deficits (Please refer to evaluation for details):  ADL's;IADL's    Current Impairments/barriers affecting progress:  Positive Barriers: age, motivation, family support. Negative Barriers: multiple comorbidities, history of multiple falls.    OT Frequency  2x / week    OT Duration  12 weeks    OT Treatment/Interventions  Self-care/ADL training;Neuromuscular education;Therapeutic exercise;DME and/or AE instruction;Visual/perceptual remediation/compensation;Patient/family education;Therapeutic activities    Clinical Decision Making  Several treatment options, min-mod task modification necessary    Consulted and Agree with Plan of Care  Patient       Patient will benefit from skilled therapeutic intervention in order to improve the following deficits and impairments:  Abnormal gait, Decreased cognition, Pain, Decreased strength, Impaired UE functional use, Decreased activity tolerance, Decreased balance  Visit Diagnosis: Muscle weakness (generalized)  Other lack of coordination    Problem List Patient Active Problem List   Diagnosis Date Noted  . Pain and swelling of right lower extremity 01/02/2017  . Parkinson's disease ([place]) 01/02/2017    Harrel Carina, MS, OTR/L 10/03/2017,  2:11 PM  [place] MAIN [facility] SERVICES [address] [place], [place], [address] Phone: [phone]   Fax:  [phone]  Name: Kelsey Owens MRN: [id number] Date of Birth:  08/30/1940 

## 2017-10-03 NOTE — Therapy (Signed)
[place] MAIN [facility] SERVICES [address] [place], [place], [address] Phone: [phone]   Fax:  [phone]  Physical Therapy Treatment  Patient Details  Name: Kelsey Owens MRN: [id number] Date of Birth: 29-May-1940 Referring Provider: Jennelle Human   Encounter Date: 10/03/2017  PT End of Session - 10/03/17 1440    Visit Number  18    Number of Visits  33    Date for PT Re-Evaluation  12/03/17    PT Start Time  0230    PT Stop Time  0310    PT Time Calculation (min)  40 min    Equipment Utilized During Treatment  Gait belt    Activity Tolerance  Patient tolerated treatment well    Behavior During Therapy  [facility] for tasks assessed/performed       Past Medical History:  Diagnosis Date  . Arthritis   . GERD (gastroesophageal reflux disease)   . Parkinson disease [facility])     Past Surgical History:  Procedure Laterality Date  . ABDOMINAL HYSTERECTOMY    . CHOLECYSTECTOMY    . DEEP BRAIN STIMULATOR PLACEMENT    . HEMORROIDECTOMY      There were no vitals filed for this visit.  Subjective Assessment - 10/03/17 1438    Subjective  Pt reports that she is having 6/10 pain in her left shoulder today.   No specific questions or concerns at this time.     Patient is accompained by:  Family member    Pertinent History  Patient has had parkinsons disease for the last 20 years. Her mobility has been getting worse after she had a left radius fx.  She was walking with RW in 10/18.  Patient has worseing loss of balance beginning 10/18.  She had HHPT 2 x week for 2 months beginining after hospitilization in Dec 2018.     Patient Stated Goals  Patient wants to be able to walk better and not fall as often    Currently in Pain?  Yes    Pain Score  6     Pain Location  Shoulder    Pain Descriptors / Indicators  Aching    Pain Type  Chronic pain    Pain Onset  More than a month ago    Aggravating Factors   weight bearing    Pain Relieving  Factors  rest    Effect of Pain on Daily Activities  makes it worse    Multiple Pain Sites  No       Ther-ex NuStep L2 x 4 minutes for strength and endurance, fatigue monitored throughout  Sitto stand in parallel bars from regular height chair  2 x 5  Needed Heavy external cues for anterior trunk lean and hand placement. Improved sequencing with repetition however pt continues to have significant postierior trunk lean;  Standing high knee hip marching x 15 bilateral with UE support Standing hip abduction x 15 bilateral with UE support Seated clams with manual resistance x 15 Seated LAQ x 15 x 2 3 lbs Leg press 75#x 20, 90# x 20,  Gait trainingwithfront wheeled walker intermittently when moving between NuStep and // bars and then // bars and leg press.  Pt requires moderate cues for proper weight shift and to stand within confines of walker. Min/modA+1 support for balance                          PT Education -  10/03/17 1440    Education Details  HEP    Person(s) Educated  Patient    Methods  Explanation;Demonstration;Tactile cues    Comprehension  Verbalized understanding;Returned demonstration       PT Short Term Goals - 09/12/17 1423      PT SHORT TERM GOAL #1   Title  Patient will be independent in home exercise program to improve strength/mobility for better functional independence with ADLs.    Time  4    Period  Weeks    Status  On-going    Target Date  12/03/17      PT SHORT TERM GOAL #2   Title  Patient will perofrm sit to stand with correct hand placement and without support on the back of the chair with RW  and SBA.     Baseline  Patient needs max assist and needs max assist for correct hand placement.    Time  4    Period  Weeks    Status  Unable to assess    Target Date  12/03/17      PT SHORT TERM GOAL #3   Title  Patient will stand for 1 minute with UE support to be able to perform safe transfers.     Baseline  Patient can  stand for 10 seconds    Time  4    Period  Weeks    Status  New        PT Long Term Goals - 09/12/17 1422      PT LONG TERM GOAL #1   Title  Patient will be able to ambulate in parallel bars x 10 feet with min assist.    Baseline  ; Patient is ambulating wiht rollator 25 feet and min assist    Time  12    Period  Weeks    Status  Partially Met    Target Date  12/03/17      PT LONG TERM GOAL #2   Title  Patient will ambulate short distances ,25 feet, with RW and mod assist.     Baseline  : 08/22/17 patient is ambulating 15 feet with RW:09/12/17 25 feet with min assist and rollator    Time  12    Period  Weeks    Status  Partially Met    Target Date  12/03/17      PT LONG TERM GOAL #3   Title  Patient will be able to ascend 1 step with min assist with LARD.    Baseline  max assist with HHA    Time  12    Period  Weeks    Status  Partially Met    Target Date  12/03/17            Plan - 10/03/17 1441    Clinical Impression Statement  Patient performs sit to stand transfer wiht max verbal cuing for leaning fwd and correct hand placement. She performs gait training with rollator for 160 feet with CGA. She has decreased motor planning and gets frozen during transfer training. She will conitineu to benefit from skilled PT to improve mobility and strength.    Rehab Potential  Good    PT Frequency  2x / week    PT Duration  12 weeks    PT Treatment/Interventions  Therapeutic exercise;Therapeutic activities;Functional mobility training;Stair training;Balance training;Neuromuscular re-education;Patient/family education;Aquatic Therapy    PT Next Visit Plan  mobility training, strengthening, balanace training    PT Home Exercise Plan  LAQ, hip flex    Consulted and Agree with Plan of Care  Patient;Family member/caregiver       Patient will benefit from skilled therapeutic intervention in order to improve the following deficits and impairments:  Decreased balance, Decreased  endurance, Decreased mobility, Decreased safety awareness, Decreased strength, Decreased activity tolerance  Visit Diagnosis: Muscle weakness (generalized)  Other lack of coordination  Difficulty in walking, not elsewhere classified  Pain and swelling of right lower extremity  Parkinson's disease ([place])  Dysphonia     Problem List Patient Active Problem List   Diagnosis Date Noted  . Pain and swelling of right lower extremity 01/02/2017  . Parkinson's disease ([place]) 01/02/2017    Alanson Puls , PT DPT 10/03/2017, 2:42 PM  [place] MAIN [facility] SERVICES [address] [place], [place], [address] Phone: [phone]   Fax:  [phone]  Name: Kelsey Owens MRN: [id number] Date of Birth: 1940/07/17

## 2017-10-08 ENCOUNTER — Encounter: Payer: Medicare Other | Admitting: Occupational Therapy

## 2017-10-08 ENCOUNTER — Ambulatory Visit: Payer: Medicare Other | Admitting: Physical Therapy

## 2017-10-10 ENCOUNTER — Encounter: Payer: Self-pay | Admitting: Occupational Therapy

## 2017-10-10 ENCOUNTER — Ambulatory Visit: Payer: Medicare Other | Admitting: Physical Therapy

## 2017-10-10 ENCOUNTER — Encounter: Payer: Self-pay | Admitting: Physical Therapy

## 2017-10-10 ENCOUNTER — Ambulatory Visit: Payer: Medicare Other | Admitting: Occupational Therapy

## 2017-10-10 DIAGNOSIS — M79604 Pain in right leg: Secondary | ICD-10-CM

## 2017-10-10 DIAGNOSIS — M6281 Muscle weakness (generalized): Secondary | ICD-10-CM

## 2017-10-10 DIAGNOSIS — R278 Other lack of coordination: Secondary | ICD-10-CM

## 2017-10-10 DIAGNOSIS — R262 Difficulty in walking, not elsewhere classified: Secondary | ICD-10-CM

## 2017-10-10 DIAGNOSIS — G20 Parkinson's disease: Secondary | ICD-10-CM

## 2017-10-10 DIAGNOSIS — G20A1 Parkinson's disease without dyskinesia, without mention of fluctuations: Secondary | ICD-10-CM

## 2017-10-10 DIAGNOSIS — M7989 Other specified soft tissue disorders: Secondary | ICD-10-CM

## 2017-10-10 NOTE — Therapy (Signed)
[place] MAIN [facility] SERVICES [address] [place], [place], [address] Phone: [phone]   Fax:  [phone]  Physical Therapy Treatment  Patient Details  Name: Kelsey Owens MRN: [id number] Date of Birth: 06-Sep-1940 Referring Provider: Jennelle Human   Encounter Date: 10/10/2017  PT End of Session - 10/10/17 1554    Visit Number  19    Number of Visits  33    Date for PT Re-Evaluation  12/03/17    PT Start Time  0315    PT Stop Time  0400    PT Time Calculation (min)  45 min    Equipment Utilized During Treatment  Gait belt    Activity Tolerance  Patient tolerated treatment well    Behavior During Therapy  [facility] for tasks assessed/performed       Past Medical History:  Diagnosis Date  . Arthritis   . GERD (gastroesophageal reflux disease)   . Parkinson disease [facility])     Past Surgical History:  Procedure Laterality Date  . ABDOMINAL HYSTERECTOMY    . CHOLECYSTECTOMY    . DEEP BRAIN STIMULATOR PLACEMENT    . HEMORROIDECTOMY      There were no vitals filed for this visit.  Subjective Assessment - 10/10/17 1553    Subjective  Pt reports that she is having 6/10 pain in her left shoulder today.   No specific questions or concerns at this time.     Patient is accompained by:  Family member    Pertinent History  Patient has had parkinsons disease for the last 20 years. Her mobility has been getting worse after she had a left radius fx.  She was walking with RW in 10/18.  Patient has worseing loss of balance beginning 10/18.  She had HHPT 2 x week for 2 months beginining after hospitilization in Dec 2018.     Patient Stated Goals  Patient wants to be able to walk better and not fall as often    Currently in Pain?  No/denies    Pain Score  0-No pain    Pain Onset  More than a month ago         Therapeutic exercise:  Bridges x 15 x 2  hooklying marching  2   hooklying ER/abd x 20 with YTB   SAQ with 3 lbs x 20  BLE  SLR x 15 BLE   LAQ x 15 x 2 bLE  Transfer training sit to stand from mat to rollator with min assist and vc for sequencing and hand placement. 3 x 2 sets   CGA and Min to mod verbal cues used throughout with increased in postural sway and LOB most seen with narrow base of support and while on uneven surfaces. Continues to have balance deficits typical with diagnosis. Patient performs intermediate level exercises without pain behaviors and needs verbal cuing for postural alignment and head positioning                         PT Education - 10/10/17 1553    Education Details  HEP    Person(s) Educated  Patient    Methods  Explanation;Tactile cues    Comprehension  Verbalized understanding;Returned demonstration       PT Short Term Goals - 09/12/17 1423      PT SHORT TERM GOAL #1   Title  Patient will be independent in home exercise program to improve strength/mobility for better functional independence  with ADLs.    Time  4    Period  Weeks    Status  On-going    Target Date  12/03/17      PT SHORT TERM GOAL #2   Title  Patient will perofrm sit to stand with correct hand placement and without support on the back of the chair with RW  and SBA.     Baseline  Patient needs max assist and needs max assist for correct hand placement.    Time  4    Period  Weeks    Status  Unable to assess    Target Date  12/03/17      PT SHORT TERM GOAL #3   Title  Patient will stand for 1 minute with UE support to be able to perform safe transfers.     Baseline  Patient can stand for 10 seconds    Time  4    Period  Weeks    Status  New        PT Long Term Goals - 09/12/17 1422      PT LONG TERM GOAL #1   Title  Patient will be able to ambulate in parallel bars x 10 feet with min assist.    Baseline  ; Patient is ambulating wiht rollator 25 feet and min assist    Time  12    Period  Weeks    Status  Partially Met    Target Date  12/03/17      PT LONG TERM  GOAL #2   Title  Patient will ambulate short distances ,25 feet, with RW and mod assist.     Baseline  : 08/22/17 patient is ambulating 15 feet with RW:09/12/17 25 feet with min assist and rollator    Time  12    Period  Weeks    Status  Partially Met    Target Date  12/03/17      PT LONG TERM GOAL #3   Title  Patient will be able to ascend 1 step with min assist with LARD.    Baseline  max assist with HHA    Time  12    Period  Weeks    Status  Partially Met    Target Date  12/03/17            Plan - 10/10/17 1554    Clinical Impression Statement  Pt with excellent motivation during therapy session today. She requires repeated cues for adequate anterior trunk lean to perform safe sit to stand transfers. She demonstrates some bilateral LE buckling during gait in gym with rolling walker when moving between surfaces. Patient will continue to benefit from skilled physical therapy to improve pain and mobility    Rehab Potential  Good    PT Frequency  2x / week    PT Duration  12 weeks    PT Treatment/Interventions  Therapeutic exercise;Therapeutic activities;Functional mobility training;Stair training;Balance training;Neuromuscular re-education;Patient/family education;Aquatic Therapy    PT Next Visit Plan  mobility training, strengthening, balanace training    PT Home Exercise Plan  LAQ, hip flex    Consulted and Agree with Plan of Care  Patient;Family member/caregiver       Patient will benefit from skilled therapeutic intervention in order to improve the following deficits and impairments:  Decreased balance, Decreased endurance, Decreased mobility, Decreased safety awareness, Decreased strength, Decreased activity tolerance  Visit Diagnosis: Other lack of coordination  Difficulty in walking, not elsewhere classified  Muscle weakness (  generalized)  Pain and swelling of right lower extremity  Parkinson's disease [facility])     Problem List Patient Active Problem List    Diagnosis Date Noted  . Pain and swelling of right lower extremity 01/02/2017  . Parkinson's disease ([place]) 01/02/2017    Alanson Puls, PT DPT 10/10/2017, 3:55 PM  [place] MAIN [facility] SERVICES [address] [place], [place], [address] Phone: [phone]   Fax:  [phone]  Name: Kelsey Owens MRN: [id number] Date of Birth: Feb 19, 1940

## 2017-10-10 NOTE — Therapy (Signed)
[place] MAIN [facility] SERVICES [address] [place], [place], [address] Phone: [phone]   Fax:  [phone]  Occupational Therapy Treatment  Patient Details  Name: Kelsey Owens MRN: [id number] Date of Birth: [birth year] Referring Provider: Jennelle Human   Encounter Date: 10/10/2017  OT End of Session - 10/10/17 1359    Visit Number  14    Number of Visits  24    Date for OT Re-Evaluation  10/10/17    Authorization Time Period  Visit 2 of 10 for progress report period starting 10/03/2017    OT Start Time  1347    OT Stop Time  1430    OT Time Calculation (min)  43 min    Activity Tolerance  Patient tolerated treatment well    Behavior During Therapy  [facility] for tasks assessed/performed       Past Medical History:  Diagnosis Date  . Arthritis   . GERD (gastroesophageal reflux disease)   . Parkinson disease [facility])     Past Surgical History:  Procedure Laterality Date  . ABDOMINAL HYSTERECTOMY    . CHOLECYSTECTOMY    . DEEP BRAIN STIMULATOR PLACEMENT    . HEMORROIDECTOMY      There were no vitals filed for this visit.  Subjective Assessment - 10/10/17 1357    Subjective   Pt. reports that she wants to continue working on [facility] skills.    Patient is accompained by:  Family member    Pertinent History  Pt. is a 77 y.o. female with a history of Parkinsons Disease. Pt. has a Deep Brain Stimulator, and has recently had it adjusted. Pt. has had multiple falls, and has a history of multiple wrist fractures.     Patient Stated Goals  To be as independent as possible.     Currently in Pain?  Yes      OT TREATMENT    Neuro muscular re-education:  Pt. worked on right hand [facility] skills grasping magnetic resistive pegs, and placing them on a whiteboard placed at a vertical angle at the tabletop. Pt. worked on grasping 1/4" circular topped pegs. Pt. worked on placing the pegs onto a small pegboard. Pt. worked on manipulating, and grasping  extra small earring backings. Pt. requires verbal cues for instruction.                          OT Education - 10/10/17 1359    Education Details  [facility] Skills    Person(s) Educated  Patient    Methods  Explanation;Demonstration    Comprehension  Verbalized understanding;Returned demonstration          OT Long Term Goals - 10/01/17 1508      OT LONG TERM GOAL #1   Title  Pt. will improve BUE strength by 2 muscle grades to improve ADL, and IADL functioning.    Baseline  continue    Time  12    Period  Weeks    Status  On-going    Target Date  10/10/17      OT LONG TERM GOAL #2   Title  Pt. will increase Left grip strength by 5# to be able to open jars, and containers.    Baseline  Limited grip strength    Time  12    Period  Weeks    Status  On-going    Target Date  10/10/17      OT LONG TERM GOAL #  3   Title  Pt. will improve left hand [facility] skills  to be able to take items (cards/change) out of a wallet.    Baseline  Limited [facility] skills    Time  12    Period  Weeks    Status  On-going    Target Date  10/10/17      OT LONG TERM GOAL #4   Title  Pt. will independently write one sentence efficiently with 75% legibility.    Baseline  25% legibility for name only.    Time  12    Period  Weeks    Status  On-going    Target Date  10/10/17      OT LONG TERM GOAL #5   Title  Pt. independently demonstrate work simplification strategies, and compensatory startegies for safe IADL tasks/home management tasks.       Baseline  Pt. continues to have difficulty    Time  12    Period  Weeks    Status  On-going    Target Date  10/10/17      OT LONG TERM GOAL #6   Title  Pt. will independently demonstrate work simplification strategies, and compensatory startegies within the kitchen.    Baseline  Continue    Time  12    Period  Weeks    Status  On-going    Target Date  10/10/17            Plan - 10/10/17 1359    Clinical Impression Statement  Pt.  reports that she is trying to engage her RUE during more tasks at home. Pt. continues to present with limited [facility] skills making is difficult to manipulate smalll object. Pt. continues to work on improving UE strength, and [facility] skills.     Occupational Profile and client history currently impacting functional performance  Pt. resides with her husband who is a Theme park manager. Pt. has grown children.    Occupational performance deficits (Please refer to evaluation for details):  ADL's;IADL's    Current Impairments/barriers affecting progress:  Positive Barriers: age, motivation, family support. Negative Barriers: multiple comorbidities, history of multiple falls.    OT Frequency  2x / week    OT Duration  12 weeks    OT Treatment/Interventions  Self-care/ADL training;Neuromuscular education;Therapeutic exercise;DME and/or AE instruction;Visual/perceptual remediation/compensation;Patient/family education;Therapeutic activities    Clinical Decision Making  Several treatment options, min-mod task modification necessary    Consulted and Agree with Plan of Care  Patient       Patient will benefit from skilled therapeutic intervention in order to improve the following deficits and impairments:  Abnormal gait, Decreased cognition, Pain, Decreased strength, Impaired UE functional use, Decreased activity tolerance, Decreased balance  Visit Diagnosis: Other lack of coordination  Muscle weakness (generalized)    Problem List Patient Active Problem List   Diagnosis Date Noted  . Pain and swelling of right lower extremity 01/02/2017  . Parkinson's disease ([place]) 01/02/2017    Harrel Carina, MS, OTR/L 10/10/2017, 2:12 PM  [place] MAIN [facility] SERVICES [address] [place], [place], [address] Phone: [phone]   Fax:  [phone]  Name: Azile Minardi MRN: [id number] Date of Birth: Mar 25, 1940

## 2017-10-16 ENCOUNTER — Emergency Department
Admission: EM | Admit: 2017-10-16 | Discharge: 2017-10-16 | Disposition: A | Discharge status: Home / Self Care | Payer: Medicare Other | Attending: Emergency Medicine | Admitting: Emergency Medicine

## 2017-10-16 ENCOUNTER — Ambulatory Visit: Payer: Medicare Other | Admitting: Physical Therapy

## 2017-10-16 ENCOUNTER — Ambulatory Visit: Payer: Medicare Other | Admitting: Occupational Therapy

## 2017-10-16 ENCOUNTER — Other Ambulatory Visit: Payer: Self-pay

## 2017-10-16 ENCOUNTER — Emergency Department: Payer: Medicare Other

## 2017-10-16 DIAGNOSIS — Z79899 Other long term (current) drug therapy: Secondary | ICD-10-CM | POA: Insufficient documentation

## 2017-10-16 DIAGNOSIS — G20 Parkinson's disease: Secondary | ICD-10-CM | POA: Diagnosis not present

## 2017-10-16 DIAGNOSIS — Y929 Unspecified place or not applicable: Secondary | ICD-10-CM | POA: Diagnosis not present

## 2017-10-16 DIAGNOSIS — S0990XA Unspecified injury of head, initial encounter: Secondary | ICD-10-CM | POA: Diagnosis present

## 2017-10-16 DIAGNOSIS — W19XXXA Unspecified fall, initial encounter: Secondary | ICD-10-CM

## 2017-10-16 DIAGNOSIS — Z9049 Acquired absence of other specified parts of digestive tract: Secondary | ICD-10-CM | POA: Insufficient documentation

## 2017-10-16 DIAGNOSIS — Y998 Other external cause status: Secondary | ICD-10-CM | POA: Insufficient documentation

## 2017-10-16 DIAGNOSIS — Y9389 Activity, other specified: Secondary | ICD-10-CM | POA: Diagnosis not present

## 2017-10-16 DIAGNOSIS — S0101XA Laceration without foreign body of scalp, initial encounter: Secondary | ICD-10-CM | POA: Insufficient documentation

## 2017-10-16 DIAGNOSIS — W01198A Fall on same level from slipping, tripping and stumbling with subsequent striking against other object, initial encounter: Secondary | ICD-10-CM | POA: Diagnosis not present

## 2017-10-16 MED ORDER — ACETAMINOPHEN 325 MG PO TABS
650.0000 mg | ORAL_TABLET | Freq: Once | ORAL | Status: AC
  Administered 2017-10-16: 650 mg via ORAL
  Filled 2017-10-16: qty 2

## 2017-10-16 MED ORDER — LIDOCAINE HCL (PF) 1 % IJ SOLN
INTRAMUSCULAR | Status: AC
  Administered 2017-10-16: 05:00:00
  Filled 2017-10-16: qty 5

## 2017-10-16 NOTE — ED Triage Notes (Signed)
Pt arrived via ems with fall after trying to get up from the toilet and hitting head on the door stop. Pt has laceration to the back of the head that was wrapped in cloth on arrival. Pt has noted large hematoma to the back of the head. Pt A&O with no LOC after fall. Pt "NAD, respirations even and non labored.

## 2017-10-16 NOTE — ED Notes (Signed)
Cleaned pt wound

## 2017-10-16 NOTE — Discharge Instructions (Signed)
You have been seen in the Emergency Department (ED) today for a fall.  Your work up does not show any concerning injuries.  Please take over-the-counter ibuprofen and/or Tylenol as needed for your pain (unless you have an allergy or your doctor as told you not to take them), or take any prescribed medication as instructed.  I used sutures in your head laceration rather than staples.  They will need to be removed in about a week.  Please let your doctor know that there are 3 blue Prolene sutures.    Please follow up with your doctor regarding today's Emergency Department (ED) visit and your recent fall.    Return to the ED if you have any headache, confusion, slurred speech, weakness/numbness of any arm or leg, or any increased pain.

## 2017-10-16 NOTE — ED Notes (Signed)
Patient transported to CT 

## 2017-10-16 NOTE — ED Notes (Signed)
Saline soaked gauze applied to the back of pt's head.

## 2017-10-16 NOTE — ED Provider Notes (Signed)
[facility] Emergency Department Provider Note  ____________________________________________   First MD Initiated Contact with Patient 10/16/17 [phone]     (approximate)  I have reviewed the triage vital signs and the nursing notes.   HISTORY  Chief Complaint Fall    HPI Kelsey Owens is a 77 y.o. female his medical history is most notable for Parkinson disease with deep brain stimulator is followed by neurology at [facility].  She presents by EMS tonight after a fall.  She reports that she was going to the commode when she became tripped up and fell, striking the back of her head on the floor.  She did not lose consciousness.  She denies neck pain and only has a headache at the site of the laceration on the back of her head.  No visual changes.  She denies chest pain, shortness of breath, nausea, vomiting, and abdominal pain.  She is reporting no pain in her arms or her legs.  The bleeding is well controlled at this time.  The onset was acute and the injury was potentially severe but she is in no distress at this time.  Past Medical History:  Diagnosis Date  . Arthritis   . GERD (gastroesophageal reflux disease)   . Parkinson disease [facility])     Patient Active Problem List   Diagnosis Date Noted  . Pain and swelling of right lower extremity 01/02/2017  . Parkinson's disease ([place]) 01/02/2017    Past Surgical History:  Procedure Laterality Date  . ABDOMINAL HYSTERECTOMY    . CHOLECYSTECTOMY    . DEEP BRAIN STIMULATOR PLACEMENT    . HEMORROIDECTOMY      Prior to Admission medications   Medication Sig Start Date End Date Taking? Authorizing Provider  almotriptan (AXERT) 12.5 MG tablet Take by mouth.    [provider]  baclofen (LIORESAL) 10 MG tablet baclofen 10 mg tablet    [provider]  Biotin 2.5 MG TABS Take by mouth.    [provider]  carbidopa-levodopa (SINEMET IR) 25-100 MG tablet 0.5 tablets 6 (six) times daily.     [provider]  clonazePAM (KLONOPIN) 0.5 MG tablet Take by mouth. 12/14/16 06/12/17  [provider]  HYDROcodone-acetaminophen (NORCO) 5-325 MG per tablet Take 1 tablet by mouth every 6 (six) hours as needed for moderate pain or severe pain. Patient not taking: Reported on 01/02/2017 06/26/14   Ruffian, III Luanna Cole, PA-C  Lifitegrast (XIIDRA) 5 % SOLN xiidra 5 % soln    [provider]  medroxyPROGESTERone (PROVERA) 2.5 MG tablet medroxyprogesterone acetate 2.5 mgtabs    [provider]  meloxicam (MOBIC) 15 MG tablet meloxicam 15 mg tabs    [provider]  Mesalamine (DELZICOL) 400 MG CPDR DR capsule 2 (two) times daily.    [provider]  mirabegron ER (MYRBETRIQ) 25 MG TB24 tablet myrbetriq 25 mg tb24    [provider]  ondansetron (ZOFRAN ODT) 4 MG disintegrating tablet Take 1 tablet (4 mg total) by mouth every 8 (eight) hours as needed for nausea or vomiting. 01/21/15   Harvest Dark, MD  ranitidine (ZANTAC) 300 MG tablet ranitidine hcl 300 mg tabs    [provider]  rOPINIRole (REQUIP) 1 MG tablet Take by mouth. 06/19/16   [provider]  selegiline (ELDEPRYL) 5 MG capsule selegiline 5 mg capsule 04/05/16   [provider]  sertraline (ZOLOFT) 25 MG tablet sertraline 25 mg tablet 02/04/16   [provider]  simethicone (GAS-X) 80 MG chewable tablet Chew 1 tablet (80 mg total) by mouth every 6 (six) hours as needed for flatulence. 07/06/16 07/06/17  Merlyn Lot, MD  traMADol (ULTRAM) 50 MG tablet tramadol 50 mg tablet    [provider]  Vitamin D, Ergocalciferol, (DRISDOL) 50000 units CAPS capsule vitamin d 50000 unit caps    [provider]    Allergies Levaquin [levofloxacin in d5w]; Penicillins; Thorazine [chlorpromazine]; and Ceftin [cefuroxime]  Family History  Problem Relation Age of Onset  . Breast cancer Maternal Aunt   . Breast cancer Paternal  Grandmother     Social History Social History   Tobacco Use  . Smoking status: Never Smoker  . Smokeless tobacco: Never Used  Substance Use Topics  . Alcohol use: Yes    Comment: occasional  . Drug use: No    Review of Systems Constitutional: No fever/chills Eyes: No visual changes. ENT: No sore throat. Cardiovascular: Denies chest pain. Respiratory: Denies shortness of breath. Gastrointestinal: No abdominal pain.  No nausea, no vomiting.  No diarrhea.  No constipation. Genitourinary: Negative for dysuria. Musculoskeletal: Swelling and laceration to the back of her head with some bleeding which is now well controlled. Integumentary: Negative for rash. Neurological: Negative for headaches, focal weakness or numbness.   ____________________________________________   PHYSICAL EXAM:  VITAL SIGNS: ED Triage Vitals  Enc Vitals Group     BP 10/16/17 0218 (!) 180/87     Pulse Rate 10/16/17 0221 86     Resp 10/16/17 0221 (!) 21     Temp 10/16/17 0220 (!) 97.4 F (36.3 C)     Temp Source 10/16/17 0220 Oral     SpO2 10/16/17 0221 100 %     Weight 10/16/17 0218 77.1 kg (170 lb)     Height 10/16/17 0218 1.626 m (5' 4" )     Head Circumference --      Peak Flow --      Pain Score 10/16/17 0218 3     Pain Loc --      Pain Edu? --      Excl. in [place]? --     Constitutional: Alert and oriented.  Generally well-appearing in spite of the obvious head injury, laughing and joking with me, no distress Eyes: Conjunctivae are normal. PERRL. EOMI. Head: Large hematoma with 2.5 cm laceration to the back of her head.  See procedure note Nose: No congestion/rhinnorhea. Mouth/Throat: Mucous membranes are moist. Neck: No stridor.  No meningeal signs.  No cervical spine tenderness to palpation. Cardiovascular: Normal rate, regular rhythm. Good peripheral circulation. Grossly normal heart sounds. Respiratory: Normal respiratory effort.  No retractions. Lungs CTAB. Gastrointestinal: Soft and  nontender. No distention.  Musculoskeletal: No lower extremity tenderness nor edema. No gross deformities of extremities. Neurologic:  Normal speech and language. No gross focal neurologic deficits are appreciated.  Skin:  Skin is warm, dry and intact. No rash noted.  2.5 cm laceration to the back of the head with underlying hematoma. Psychiatric: Mood and affect are normal. Speech and behavior are normal.  ____________________________________________   LABS (all labs ordered are listed, but only abnormal results are displayed)  Labs Reviewed - No data to display ____________________________________________  EKG  No indication for EKG ____________________________________________  RADIOLOGY   ED MD interpretation: No indication of acute intracranial bleeding nor cervical spine fracture.  Official radiology report(s): Ct Head Wo Contrast  Result Date: 10/16/2017 CLINICAL DATA:  [place] in bathroom, struck head on door. No loss of consciousness.  History of Parkinson's disease and deep brain stimulator. EXAM: CT HEAD WITHOUT CONTRAST CT CERVICAL SPINE WITHOUT CONTRAST TECHNIQUE: Multidetector CT imaging of the head and cervical spine was performed following the standard protocol without intravenous contrast. Multiplanar CT image reconstructions of the cervical spine were also generated. COMPARISON:  CT HEAD and cervical spine October 10, 2015 FINDINGS: CT HEAD FINDINGS BRAIN: No intraparenchymal hemorrhage, mass effect nor midline shift. Bilateral deep brain stimulator leads via frontal burr holes with distal tips and hypothalamic region. Probable gliosis along lead tract. Moderate parenchymal brain volume loss. Patchy to confluent supratentorial white matter hypodensities. No acute large vascular territory infarcts. No abnormal extra-axial fluid collections. Basal cisterns are patent. VASCULAR: Mild-to-moderate calcific atherosclerosis of the carotid siphons. SKULL: No skull fracture. Moderate  LEFT parietal scalp hematoma without subcutaneous gas or radiopaque foreign bodies. SINUSES/ORBITS: Trace paranasal sinus mucosal thickening. Mastoid air cells are well aerated.The included ocular globes and orbital contents are non-suspicious. OTHER: None. CT CERVICAL SPINE FINDINGS ALIGNMENT: Straightened lordosis. Stable grade 1 C3-4 anterolisthesis. SKULL BASE AND VERTEBRAE: Cervical vertebral bodies and posterior elements are intact. LEFT C2-3 and C4-5 facets fused on degenerative basis. Severe C4-5 through C7-T1 degenerative discs with endplate spurring. C1-2 articulation maintained with moderate arthropathy. No destructive bony lesions. No destructive bony lesions. C1-2 articulation maintained. SOFT TISSUES AND SPINAL CANAL: Nonacute. Lead coursing LEFT neck soft tissues. DISC LEVELS: No significant osseous canal stenosis. Severe bilateral C3-4 and LEFT C4-5 neural foraminal narrowing. UPPER CHEST: Lung apices are clear. OTHER: None. IMPRESSION: CT HEAD: 1. No acute intracranial process. Moderate LEFT parietal scalp hematoma. 2. Bilateral deep brain stimulators. 3. Stable parenchymal brain volume loss and moderate chronic small vessel ischemic changes. CT CERVICAL SPINE: 1. No fracture. Similar grade 1 C3-4 anterolisthesis on a degenerative basis. 2. Severe C3-4 and C4-5 neural foraminal narrowing. Electronically Signed   By: Elon Alas M.D.   On: 10/16/2017 03:28   Ct Cervical Spine Wo Contrast  Result Date: 10/16/2017 CLINICAL DATA:  [place] in bathroom, struck head on door. No loss of consciousness. History of Parkinson's disease and deep brain stimulator. EXAM: CT HEAD WITHOUT CONTRAST CT CERVICAL SPINE WITHOUT CONTRAST TECHNIQUE: Multidetector CT imaging of the head and cervical spine was performed following the standard protocol without intravenous contrast. Multiplanar CT image reconstructions of the cervical spine were also generated. COMPARISON:  CT HEAD and cervical spine October 10, 2015  FINDINGS: CT HEAD FINDINGS BRAIN: No intraparenchymal hemorrhage, mass effect nor midline shift. Bilateral deep brain stimulator leads via frontal burr holes with distal tips and hypothalamic region. Probable gliosis along lead tract. Moderate parenchymal brain volume loss. Patchy to confluent supratentorial white matter hypodensities. No acute large vascular territory infarcts. No abnormal extra-axial fluid collections. Basal cisterns are patent. VASCULAR: Mild-to-moderate calcific atherosclerosis of the carotid siphons. SKULL: No skull fracture. Moderate LEFT parietal scalp hematoma without subcutaneous gas or radiopaque foreign bodies. SINUSES/ORBITS: Trace paranasal sinus mucosal thickening. Mastoid air cells are well aerated.The included ocular globes and orbital contents are non-suspicious. OTHER: None. CT CERVICAL SPINE FINDINGS ALIGNMENT: Straightened lordosis. Stable grade 1 C3-4 anterolisthesis. SKULL BASE AND VERTEBRAE: Cervical vertebral bodies and posterior elements are intact. LEFT C2-3 and C4-5 facets fused on degenerative basis. Severe C4-5 through C7-T1 degenerative discs with endplate spurring. C1-2 articulation maintained with moderate arthropathy. No destructive bony lesions. No destructive bony lesions. C1-2 articulation maintained. SOFT TISSUES AND SPINAL CANAL: Nonacute. Lead coursing LEFT neck soft tissues. DISC LEVELS: No significant osseous canal stenosis. Severe bilateral C3-4 and  LEFT C4-5 neural foraminal narrowing. UPPER CHEST: Lung apices are clear. OTHER: None. IMPRESSION: CT HEAD: 1. No acute intracranial process. Moderate LEFT parietal scalp hematoma. 2. Bilateral deep brain stimulators. 3. Stable parenchymal brain volume loss and moderate chronic small vessel ischemic changes. CT CERVICAL SPINE: 1. No fracture. Similar grade 1 C3-4 anterolisthesis on a degenerative basis. 2. Severe C3-4 and C4-5 neural foraminal narrowing. Electronically Signed   By: Elon Alas M.D.   On:  10/16/2017 03:28    ____________________________________________   PROCEDURES  Critical Care performed: No   Procedure(s) performed:   Marland KitchenMarland KitchenLaceration Repair Date/Time: 10/16/2017 7:06 AM Performed by: Hinda Kehr, MD Authorized by: Hinda Kehr, MD   Consent:    Consent obtained:  Verbal   Consent given by:  Patient   Risks discussed:  Infection, pain, retained foreign body, poor cosmetic result and poor wound healing Anesthesia (see MAR for exact dosages):    Anesthesia method:  Local infiltration   Local anesthetic:  Lidocaine 1% w/o epi Laceration details:    Location:  Scalp   Scalp location:  Crown   Length (cm):  2.5 Repair type:    Repair type:  Simple Exploration:    Hemostasis achieved with:  Direct pressure   Wound exploration: entire depth of wound probed and visualized     Contaminated: no   Treatment:    Area cleansed with:  Saline   Amount of cleaning:  Extensive   Irrigation solution:  Sterile saline   Visualized foreign bodies/material removed: no   Skin repair:    Repair method:  Sutures   Suture size:  4-0   Suture material:  Prolene   Suture technique:  Simple interrupted   Number of sutures:  3 Approximation:    Approximation:  Close Post-procedure details:    Dressing:  Sterile dressing   Patient tolerance of procedure:  Tolerated well, no immediate complications Comments:     Expressed some hematoma prior to closure     ____________________________________________   INITIAL IMPRESSION / ASSESSMENT AND PLAN / ED COURSE  As part of my medical decision making, I reviewed the following data within the [place] History obtained from family, Nursing notes reviewed and incorporated and Old chart reviewed    Differential diagnosis includes, but is not limited to, mechanical fall, syncope, acute intracranial bleeding, cervical spine injury.  The patient is quite well-appearing and in no distress in spite of her injury.   She has no report of loss of consciousness and she reports that she does have falls from time to time due to her Parkinson disease.  Her husband is at the bedside.  I had my usual customary discussion with them about additional work-up other than ruling out acute traumatic injury and they are comfortable with the plan for the CT scans and repair of her laceration given that she was in her normal state of health prior to the fall.  The laceration was repaired as described above.  They are comfortable with plan for discharge and outpatient follow-up.  I gave my usual and customary return precautions.     ____________________________________________  FINAL CLINICAL IMPRESSION(S) / ED DIAGNOSES  Final diagnoses:  Fall, initial encounter  Minor head injury, initial encounter  Laceration of scalp, initial encounter     MEDICATIONS GIVEN DURING THIS VISIT:  Medications  lidocaine (PF) (XYLOCAINE) 1 % injection (  Given by Other 10/16/17 0456)  acetaminophen (TYLENOL) tablet 650 mg (650 mg Oral Given 10/16/17 0530)  ED Discharge Orders    None       Note:  This document was prepared using Dragon voice recognition software and may include unintentional dictation errors.    Hinda Kehr, MD 10/16/17 [phone]

## 2017-10-18 ENCOUNTER — Emergency Department
Admission: EM | Admit: 2017-10-18 | Discharge: 2017-10-18 | Disposition: A | Discharge status: Home / Self Care | Payer: Medicare Other | Attending: Emergency Medicine | Admitting: Emergency Medicine

## 2017-10-18 ENCOUNTER — Other Ambulatory Visit: Payer: Self-pay

## 2017-10-18 ENCOUNTER — Emergency Department: Payer: Medicare Other

## 2017-10-18 ENCOUNTER — Ambulatory Visit: Payer: Medicare Other | Admitting: Physical Therapy

## 2017-10-18 DIAGNOSIS — Y929 Unspecified place or not applicable: Secondary | ICD-10-CM | POA: Insufficient documentation

## 2017-10-18 DIAGNOSIS — Y92009 Unspecified place in unspecified non-institutional (private) residence as the place of occurrence of the external cause: Secondary | ICD-10-CM

## 2017-10-18 DIAGNOSIS — G20 Parkinson's disease: Secondary | ICD-10-CM | POA: Diagnosis not present

## 2017-10-18 DIAGNOSIS — S0083XA Contusion of other part of head, initial encounter: Secondary | ICD-10-CM

## 2017-10-18 DIAGNOSIS — W01198A Fall on same level from slipping, tripping and stumbling with subsequent striking against other object, initial encounter: Secondary | ICD-10-CM | POA: Insufficient documentation

## 2017-10-18 DIAGNOSIS — S01111A Laceration without foreign body of right eyelid and periocular area, initial encounter: Secondary | ICD-10-CM

## 2017-10-18 DIAGNOSIS — S01411A Laceration without foreign body of right cheek and temporomandibular area, initial encounter: Secondary | ICD-10-CM | POA: Diagnosis not present

## 2017-10-18 DIAGNOSIS — R51 Headache: Secondary | ICD-10-CM | POA: Insufficient documentation

## 2017-10-18 DIAGNOSIS — Y9301 Activity, walking, marching and hiking: Secondary | ICD-10-CM | POA: Diagnosis not present

## 2017-10-18 DIAGNOSIS — W19XXXA Unspecified fall, initial encounter: Secondary | ICD-10-CM

## 2017-10-18 DIAGNOSIS — N3000 Acute cystitis without hematuria: Secondary | ICD-10-CM | POA: Diagnosis not present

## 2017-10-18 DIAGNOSIS — Y999 Unspecified external cause status: Secondary | ICD-10-CM | POA: Insufficient documentation

## 2017-10-18 LAB — CBC
HCT: 38.3 % (ref 35.0–47.0)
Hemoglobin: 13 g/dL (ref 12.0–16.0)
MCH: 30.3 pg (ref 26.0–34.0)
MCHC: 33.9 g/dL (ref 32.0–36.0)
MCV: 89.4 fL (ref 80.0–100.0)
Platelets: 184 10*3/uL (ref 150–440)
RBC: 4.29 MIL/uL (ref 3.80–5.20)
RDW: 14.5 % (ref 11.5–14.5)
WBC: 6.3 10*3/uL (ref 3.6–11.0)

## 2017-10-18 LAB — URINALYSIS, COMPLETE (UACMP) WITH MICROSCOPIC
Bilirubin Urine: NEGATIVE
Glucose, UA: NEGATIVE mg/dL
Ketones, ur: NEGATIVE mg/dL
Nitrite: NEGATIVE
PH: 6 (ref 5.0–8.0)
Protein, ur: NEGATIVE mg/dL
SPECIFIC GRAVITY, URINE: 1.009 (ref 1.005–1.030)

## 2017-10-18 LAB — BASIC METABOLIC PANEL
ANION GAP: 7 (ref 5–15)
BUN: 23 mg/dL (ref 8–23)
CO2: 25 mmol/L (ref 22–32)
Calcium: 8.9 mg/dL (ref 8.9–10.3)
Chloride: 107 mmol/L (ref 98–111)
Creatinine, Ser: 1 mg/dL (ref 0.44–1.00)
GFR calc Af Amer: >60 mL/min (ref >60)
GFR, EST NON AFRICAN AMERICAN: 53 mL/min — AB (ref >60)
GLUCOSE: 100 mg/dL — AB (ref 70–99)
POTASSIUM: 3.8 mmol/L (ref 3.5–5.1)
Sodium: 139 mmol/L (ref 135–145)

## 2017-10-18 LAB — TROPONIN I: Troponin I: <0.03 ng/mL (ref <0.03)

## 2017-10-18 MED ORDER — LIDOCAINE-EPINEPHRINE 2 %-1:100000 IJ SOLN
20.0000 mL | Freq: Once | INTRAMUSCULAR | Status: AC
  Administered 2017-10-18: 20 mL via INTRADERMAL
  Filled 2017-10-18: qty 1

## 2017-10-18 MED ORDER — SULFAMETHOXAZOLE-TRIMETHOPRIM 800-160 MG PO TABS
1.0000 | ORAL_TABLET | Freq: Once | ORAL | Status: AC
  Administered 2017-10-18: 1 via ORAL
  Filled 2017-10-18: qty 1

## 2017-10-18 MED ORDER — ACETAMINOPHEN 500 MG PO TABS
1000.0000 mg | ORAL_TABLET | Freq: Once | ORAL | Status: AC
  Administered 2017-10-18: 1000 mg via ORAL
  Filled 2017-10-18: qty 2

## 2017-10-18 MED ORDER — SULFAMETHOXAZOLE-TRIMETHOPRIM 800-160 MG PO TABS: 1.0000 | ORAL_TABLET | Freq: Two times a day (BID) | ORAL | 0 refills | Status: DC

## 2017-10-18 NOTE — ED Provider Notes (Addendum)
[facility] Emergency Department Provider Note  ____________________________________________  Time seen: Approximately 2:55 PM  I have reviewed the triage vital signs and the nursing notes.   HISTORY  Chief Complaint Fall    HPI Kelsey Owens is a 77 y.o. female with a history of parkinsonism with deep brain stimulator followed at [facility] presenting for fall.  The patient was seen here yesterday, with a mechanical fall resulting in posterior scalp laceration, with reassuring CT head and C-spine.  Her laceration was repaired and she was discharged home.  Today, the patient again lost her balance while using her walker, and "pitched forward" landing on her face.  She denies any loss of consciousness and does not have any neck pain, back pain or extremity injuries.  She has been able to ambulate since the fall.  She does have a laceration over the right eyebrow and on the right cheek.  She is not anticoagulated.   Past Medical History:  Diagnosis Date  . Arthritis   . GERD (gastroesophageal reflux disease)   . Parkinson disease [facility])     Patient Active Problem List   Diagnosis Date Noted  . Pain and swelling of right lower extremity 01/02/2017  . Parkinson's disease ([place]) 01/02/2017    Past Surgical History:  Procedure Laterality Date  . ABDOMINAL HYSTERECTOMY    . CHOLECYSTECTOMY    . DEEP BRAIN STIMULATOR PLACEMENT    . HEMORROIDECTOMY      Current Outpatient Rx  . Order #: [id number] Class: Historical Med  . Order #: [id number] Class: Historical Med  . Order #: [id number] Class: Historical Med  . Order #: [id number] Class: Historical Med  . Order #: [id number] Class: Historical Med  . Order #: [id number] Class: Print  . Order #: [id number] Class: Historical Med  . Order #: [id number] Class: Historical Med  . Order #: [id number] Class: Historical Med  . Order #: [id number] Class: Historical Med  . Order #: [id number] Class: Historical Med  . Order #:  [id number] Class: Print  . Order #: [id number] Class: Historical Med  . Order #: [id number] Class: Historical Med  . Order #: [id number] Class: Historical Med  . Order #: [id number] Class: Historical Med  . Order #: [id number] Class: Print  . Order #: [id number] Class: Historical Med  . Order #: [id number] Class: Historical Med    Allergies Levaquin [levofloxacin in d5w]; Penicillins; Thorazine [chlorpromazine]; and Ceftin [cefuroxime]  Family History  Problem Relation Age of Onset  . Breast cancer Maternal Aunt   . Breast cancer Paternal Grandmother     Social History Social History   Tobacco Use  . Smoking status: Never Smoker  . Smokeless tobacco: Never Used  Substance Use Topics  . Alcohol use: Yes    Comment: occasional  . Drug use: No    Review of Systems Constitutional: No fever/chills.  No lightheadedness or syncope.  Positive mechanical fall.  No loss of consciousness. Eyes: No visual changes.  No blurred or double vision.  Positive injury to the right orbit. ENT: No sore throat. No congestion or rhinorrhea.  Positive injury to the right cheek.  Positive laceration over the right eyebrow. Cardiovascular: Denies chest pain. Denies palpitations. Respiratory: Denies shortness of breath.  No cough. Gastrointestinal: No abdominal pain.  No nausea, no vomiting.  No diarrhea.  No constipation. Genitourinary: Negative for dysuria. Musculoskeletal: Negative for back pain.  Denies neck or extremity pain. Skin: Negative for rash. Neurological: Negative for headaches. No focal numbness, tingling or weakness.  Positive parkinsonism at baseline, unchanged.    ____________________________________________   PHYSICAL EXAM:  VITAL SIGNS: ED Triage Vitals [10/18/17 1420]  Enc Vitals Group     BP (!) 187/92     Pulse Rate 69     Resp 18     Temp 97.7 F (36.5 C)     Temp Source Oral     SpO2 97 %     Weight      Height      Head Circumference      Peak Flow      Pain Score 0      Pain Loc      Pain Edu?      Excl. in [place]?     Constitutional: Alert. Answers questions appropriately.  GCS is 15. Eyes: Conjunctivae are normal.  EOMI. PERRLA no scleral icterus.  Patient has large ecchymosis around the right orbit.  1 cm laceration in the right eyebrow. Head: Positive ecchymosis over the right forehead.  2 cm skin tear over the right cheek. Nose: No congestion/rhinnorhea.  No swelling over the nose or septal hematoma. Mouth/Throat: Mucous membranes are moist.  No dental injury or malocclusion. Neck: No stridor.  Supple.  Midline C-spine tenderness to palpation, step-offs or deformities.  Full range of motion without pain. Cardiovascular: Normal rate, regular rhythm. No murmurs, rubs or gallops.  Respiratory: Normal respiratory effort.  No accessory muscle use or retractions. Lungs CTAB.  No wheezes, rales or ronchi. Gastrointestinal: Soft, nontender and nondistended.  No guarding or rebound.  No peritoneal signs. Musculoskeletal: Pelvis is stable.  No thoracic or lumbar spine tenderness to palpation.  No obvious extremity injuries.  No LE edema. No ttp in the calves or palpable cords.  Negative Homan's sign. Neurologic: alert.  Speech is clear.  Face and smile are symmetric.  EOMI. PERRLA moves all extremities well. Skin:  Skin is warm, dry.  Facial laceration and skin tear as described above.  Psychiatric: Mood and affect are normal.   ____________________________________________   LABS (all labs ordered are listed, but only abnormal results are displayed)  Labs Reviewed  BASIC METABOLIC PANEL - Abnormal; Notable for the following components:      Result Value   Glucose, Bld 100 (*)    GFR calc non Af Amer 53 (*)    All other components within normal limits  URINALYSIS, COMPLETE (UACMP) WITH MICROSCOPIC - Abnormal; Notable for the following components:   Color, Urine YELLOW (*)    APPearance CLOUDY (*)    Hgb urine dipstick MODERATE (*)    Leukocytes, UA LARGE (*)     Bacteria, UA RARE (*)    All other components within normal limits  URINE CULTURE  CBC  TROPONIN I   ____________________________________________  EKG  ED ECG REPORT I, Anne-Caroline Mariea Clonts, the attending physician, personally viewed and interpreted this ECG.   Date: 10/18/2017  EKG Time: 1532  Rate: 88  Rhythm: normal sinus rhythm  Axis: leftward  Intervals:none  ST&T Change: No STEMI  ____________________________________________  RADIOLOGY  Ct Head Wo Contrast  Result Date: 10/18/2017 CLINICAL DATA:  77 y/o F; fall with laceration to the right eye. Recent fall yesterday. EXAM: CT HEAD WITHOUT CONTRAST CT MAXILLOFACIAL WITHOUT CONTRAST TECHNIQUE: Multidetector CT imaging of the head and maxillofacial structures were performed using the standard protocol without intravenous contrast. Multiplanar CT image reconstructions of the maxillofacial structures were also generated. COMPARISON:  10/16/2017 CT head and cervical spine. FINDINGS: CT HEAD FINDINGS Brain: No evidence of acute infarction, hemorrhage, hydrocephalus, extra-axial collection or mass lesion/mass effect. Bilateral frontal approach  deep brain stimulators are stable in position, no lead discontinuity identified. Streak artifact from the leads partially obscures the brain parenchyma. Stable chronic microvascular ischemic changes and volume loss of the brain. Vascular: Calcific atherosclerosis of the carotid siphons. No hyperdense vessel. Skull: New complete dispersion of left parietal scalp hematoma. New right periorbital and frontal scalp small contusion with laceration. No calvarial fracture. Other: None. CT MAXILLOFACIAL FINDINGS Osseous: No fracture or mandibular dislocation. No destructive process. Orbits: No traumatic or inflammatory finding of the orbital compartments. Right intra-ocular lens replacement. Left globe staphyloma. Cervical spondylosis with moderate discogenic degenerative changes at the C4 through C7 levels  and grade 1 C3-4 anterolisthesis. Sinuses: Clear. Soft tissues: Right periorbital and frontal scalp small contusion with laceration. IMPRESSION: 1. Right periorbital and frontal scalp small contusion with laceration. Near complete dispersion of left parietal scalp hematoma. 2. No acute intracranial abnormality or calvarial fracture. 3. No acute facial fracture or mandibular dislocation. No traumatic finding of the right orbital compartment. 4. Stable chronic microvascular ischemic changes and volume loss of the brain. 5. Left globe staphyloma. Electronically Signed   By: Kristine Garbe M.D.   On: 10/18/2017 15:41   Ct Maxillofacial Wo Contrast  Result Date: 10/18/2017 CLINICAL DATA:  77 y/o F; fall with laceration to the right eye. Recent fall yesterday. EXAM: CT HEAD WITHOUT CONTRAST CT MAXILLOFACIAL WITHOUT CONTRAST TECHNIQUE: Multidetector CT imaging of the head and maxillofacial structures were performed using the standard protocol without intravenous contrast. Multiplanar CT image reconstructions of the maxillofacial structures were also generated. COMPARISON:  10/16/2017 CT head and cervical spine. FINDINGS: CT HEAD FINDINGS Brain: No evidence of acute infarction, hemorrhage, hydrocephalus, extra-axial collection or mass lesion/mass effect. Bilateral frontal approach deep brain stimulators are stable in position, no lead discontinuity identified. Streak artifact from the leads partially obscures the brain parenchyma. Stable chronic microvascular ischemic changes and volume loss of the brain. Vascular: Calcific atherosclerosis of the carotid siphons. No hyperdense vessel. Skull: New complete dispersion of left parietal scalp hematoma. New right periorbital and frontal scalp small contusion with laceration. No calvarial fracture. Other: None. CT MAXILLOFACIAL FINDINGS Osseous: No fracture or mandibular dislocation. No destructive process. Orbits: No traumatic or inflammatory finding of the orbital  compartments. Right intra-ocular lens replacement. Left globe staphyloma. Cervical spondylosis with moderate discogenic degenerative changes at the C4 through C7 levels and grade 1 C3-4 anterolisthesis. Sinuses: Clear. Soft tissues: Right periorbital and frontal scalp small contusion with laceration. IMPRESSION: 1. Right periorbital and frontal scalp small contusion with laceration. Near complete dispersion of left parietal scalp hematoma. 2. No acute intracranial abnormality or calvarial fracture. 3. No acute facial fracture or mandibular dislocation. No traumatic finding of the right orbital compartment. 4. Stable chronic microvascular ischemic changes and volume loss of the brain. 5. Left globe staphyloma. Electronically Signed   By: Kristine Garbe M.D.   On: 10/18/2017 15:41    ____________________________________________   PROCEDURES  Procedure(s) performed: None  .Marland KitchenLaceration Repair Date/Time: 10/18/2017 3:57 PM Performed by: Eula Listen, MD Authorized by: Eula Listen, MD   Consent:    Consent obtained:  Verbal   Consent given by:  Patient and spouse   Risks discussed:  Infection, pain, poor cosmetic result and poor wound healing   Alternatives discussed:  No treatment Anesthesia (see MAR for exact dosages):    Anesthesia method:  Local infiltration   Local anesthetic:  Lidocaine 2% WITH epi Laceration details:    Location:  Face   Face location:  R eyebrow  Length (cm):  1 Repair type:    Repair type:  Simple Pre-procedure details:    Preparation:  Patient was prepped and draped in usual sterile fashion Exploration:    Hemostasis achieved with:  Direct pressure and epinephrine   Contaminated: no   Treatment:    Wound cleansed with: water.   Amount of cleaning:  Standard   Visualized foreign bodies/material removed: no   Skin repair:    Repair method:  Sutures   Suture size:  6-0   Suture material:  Prolene   Number of sutures:   2 Approximation:    Approximation:  Close Post-procedure details:    Dressing:  Antibiotic ointment   Patient tolerance of procedure:  Tolerated well, no immediate complications  .Marland KitchenLaceration Repair Date/Time: 10/18/2017 3:59 PM Performed by: Eula Listen, MD Authorized by: Eula Listen, MD   Consent:    Consent obtained:  Verbal   Consent given by:  Patient and spouse   Risks discussed:  Infection, pain and poor cosmetic result   Alternatives discussed:  No treatment Anesthesia (see MAR for exact dosages):    Anesthesia method:  None Laceration details:    Location:  Face   Face location:  R cheek   Length (cm):  3 Repair type:    Repair type:  Simple Exploration:    Hemostasis achieved with:  Direct pressure   Contaminated: no   Treatment:    Wound cleansed with: water.   Amount of cleaning:  Standard   Visualized foreign bodies/material removed: no   Skin repair:    Repair method: dermabond. Approximation:    Approximation:  Close Post-procedure details:    Dressing:  Open (no dressing)   Patient tolerance of procedure:  Tolerated well, no immediate complications    Critical Care performed: No ____________________________________________   INITIAL IMPRESSION / ASSESSMENT AND PLAN / ED COURSE  Pertinent labs & imaging results that were available during my care of the patient were reviewed by me and considered in my medical decision making (see chart for details).  77 y.o. female with a history of parkinsonism presenting for second fall in 2 days.  The patient had a mechanical fall resulting in facial injury.  Given that she does not have frequent falls, will look for other causes of her fall with a screening EKG, basic laboratory studies, and a UA.  Trauma evaluation will include CT of the head and face.  She is not having any neck pain and did have a negative CT of the neck yesterday so there is no indication for repeating this.  The patient will  receive Tylenol for her headache.  She reports her last tetanus booster is less than 53 years old.  We will repair the laceration over her eyebrow, and use Steri-Strips for the skin tear on her face.  Plan reevaluation for final disposition.  ----------------------------------------- 3:53 PM on 10/18/2017 -----------------------------------------  The patient continues to be without any acute symptoms and her headache has improved.  Her CT scan does not show any acute intracranial or facial abnormalities.  I have repaired her eyebrow laceration in her cheek skin tear.  Her laboratory studies are reassuring.  She does have a urinary tract infection and I have given her first dose of Bactrim here and have written her a prescription for a 5-day course.  I have explained to her the suture removal process.  At this time, the patient is safe for discharge home.  Return precautions as well as follow-up instructions  were written in her paperwork, and discussed with the patient and her husband. ____________________________________________  FINAL CLINICAL IMPRESSION(S) / ED DIAGNOSES  Final diagnoses:  Acute cystitis without hematuria  Fall in home, initial encounter  Laceration of right eyebrow, initial encounter  Contusion of face, initial encounter         NEW MEDICATIONS STARTED DURING THIS VISIT:  New Prescriptions   No medications on file      Eula Listen, MD 10/18/17 1555    Eula Listen, MD 10/18/17 1559    Eula Listen, MD 10/18/17 1600

## 2017-10-18 NOTE — ED Triage Notes (Signed)
Pt seen yesterday for fall and discharged after suture repair to back of head. Pt fell today again has new laceration to right eyebrow. Bandaged by husband. Hx of parkinson's and walks with a walker.

## 2017-10-18 NOTE — ED Notes (Signed)
Wounds cleansed and ice applied.

## 2017-10-18 NOTE — Discharge Instructions (Addendum)
Please take all precautions to prevent falls.  Please take the entire course of antibiotics for your urinary tract infection, even if you are feeling better.  Please have your primary care physician follow-up the results of your urine culture.  You may continue to apply Neosporin, bacitracin, or any triple antibiotic cream over the cut on your eyebrow that has been repaired with sutures.  You may apply the cream 3 times daily and a thick coat until completely healed.  Please make an appoint with your primary care physician to have your sutures removed in 5 to 7 days.  Your doctor may remove the sutures from the back of your scalp at the same time.  Return to the emergency department if you develop severe pain, changes in mental status, lightheadedness or fainting, new numbness tingling or weakness, or any other symptoms concerning to you.

## 2017-10-21 LAB — URINE CULTURE

## 2017-10-23 ENCOUNTER — Ambulatory Visit: Payer: Medicare Other | Admitting: Occupational Therapy

## 2017-10-23 ENCOUNTER — Ambulatory Visit: Payer: Medicare Other | Admitting: Physical Therapy

## 2017-10-25 ENCOUNTER — Encounter: Payer: Self-pay | Admitting: Physical Therapy

## 2017-10-25 ENCOUNTER — Ambulatory Visit: Payer: Medicare Other | Attending: Family Medicine | Admitting: Physical Therapy

## 2017-10-25 DIAGNOSIS — M6281 Muscle weakness (generalized): Secondary | ICD-10-CM

## 2017-10-25 DIAGNOSIS — R278 Other lack of coordination: Secondary | ICD-10-CM | POA: Diagnosis present

## 2017-10-25 DIAGNOSIS — G20 Parkinson's disease: Secondary | ICD-10-CM

## 2017-10-25 DIAGNOSIS — R262 Difficulty in walking, not elsewhere classified: Secondary | ICD-10-CM | POA: Diagnosis present

## 2017-10-25 DIAGNOSIS — M79604 Pain in right leg: Secondary | ICD-10-CM | POA: Insufficient documentation

## 2017-10-25 DIAGNOSIS — M7989 Other specified soft tissue disorders: Secondary | ICD-10-CM | POA: Diagnosis present

## 2017-10-25 NOTE — Therapy (Signed)
[place] MAIN [facility] SERVICES [address] [place], [place], [address] Phone: [phone]   Fax:  [phone]  Physical Therapy Treatment/ Physical Therapy Progress Note   Dates of reporting period  09/12/17   to   10/25/17  Patient Details  Name: Koriana Stepien MRN: [id number] Date of Birth: Apr 02, 1940 Referring Provider: Jennelle Human   Encounter Date: 10/25/2017  PT End of Session - 10/25/17 1315    Visit Number  20    Number of Visits  33    Date for PT Re-Evaluation  12/03/17    PT Start Time  0105    PT Stop Time  0145    PT Time Calculation (min)  40 min    Equipment Utilized During Treatment  Gait belt    Activity Tolerance  Patient tolerated treatment well    Behavior During Therapy  [facility] for tasks assessed/performed       Past Medical History:  Diagnosis Date  . Arthritis   . GERD (gastroesophageal reflux disease)   . Parkinson disease [facility])     Past Surgical History:  Procedure Laterality Date  . ABDOMINAL HYSTERECTOMY    . CHOLECYSTECTOMY    . DEEP BRAIN STIMULATOR PLACEMENT    . HEMORROIDECTOMY      There were no vitals filed for this visit.  Subjective Assessment - 10/25/17 1314    Subjective  Pt reports that she is having 6/10 pain in her left shoulder today.   Patient had 2 falls resulting in need of stiches in the back of the head and also severe bruising on face and another laceration that required stiches. No specific questions or concerns at this time.     Patient is accompained by:  Family member    Pertinent History  Patient has had parkinsons disease for the last 20 years. Her mobility has been getting worse after she had a left radius fx.  She was walking with RW in 10/18.  Patient has worseing loss of balance beginning 10/18.  She had HHPT 2 x week for 2 months beginining after hospitilization in Dec 2018.     Patient Stated Goals  Patient wants to be able to walk better and not fall as often    Currently in Pain?  Yes    Pain Score  6     Pain Location  Shoulder    Pain Orientation  Left    Pain Descriptors / Indicators  Aching    Pain Type  Chronic pain    Pain Onset  More than a month ago    Multiple Pain Sites  No      treatment:  Gait training with rollator 350 feet with wc following behind, knees flexed and small steps, decreased height and min assist  Therapeutic activities: Transfer training from wc to stand with Rolator x 5, CGA and cues for locking and unlocking the rollator  Transfer training from wc to mat with Rolator x 3 with min assist for safety                          PT Education - 10/25/17 1315    Education Details  HEP    Person(s) Educated  Patient    Methods  Explanation    Comprehension  Verbalized understanding       PT Short Term Goals - 10/25/17 1317      PT SHORT TERM GOAL #1   Title  Patient will be independent in home exercise program to improve strength/mobility for better functional independence with ADLs.    Time  4    Period  Weeks    Status  Achieved    Target Date  10/25/17      PT SHORT TERM GOAL #2   Title  Patient will perofrm sit to stand with correct hand placement and without support on the back of the chair with RW  and SBA.     Baseline  Patient needs min assist and needs min assist for correct hand placement.    Time  4    Period  Weeks    Status  Partially Met    Target Date  12/03/17      PT SHORT TERM GOAL #3   Title  Patient will stand for 1 minute with UE support to be able to perform safe transfers.     Baseline  Patient can stand for 10 seconds    Time  4    Period  Weeks    Status  On-going    Target Date  12/03/17        PT Long Term Goals - 10/25/17 1315      PT LONG TERM GOAL #1   Title  Patient will be able to ambulate in parallel bars x 10 feet with min assist.    Baseline  ; Patient is ambulating wiht rollator 25 feet and min assist    Time  12    Period  Weeks     Status  Achieved    Target Date  12/03/17      PT LONG TERM GOAL #2   Title  Patient will ambulate short distances ,25 feet, with RW and mod assist.     Baseline  : 08/22/17 patient is ambulating 15 feet with RW:09/12/17 25 feet with min assist and rollator, 10/25/17    Time  12    Period  Weeks    Status  Partially Met    Target Date  12/03/17      PT LONG TERM GOAL #3   Title  Patient will be able to ascend 1 step with min assist with LARD.    Baseline  max assist with HHA    Time  12    Period  Weeks    Status  Partially Met    Target Date  12/03/17            Plan - 10/25/17 1331    Clinical Impression Statement   Patient's condition has the potential to improve in response to therapy. Maximum improvement is yet to be obtained. The anticipated improvement is attainable and reasonable in a generally predictable time.  Patient reports that she has had 2 bad falls in the past 2 weeks. Patient performs gait training with rollator and sit to stand transfers, wc to mat transfers with min assist and vc for safety and cues for correct motor planning and LE placement. She responds to cues verbally and tactile with mobility. Patient will continue to benefit from skilled PT to improve mobility and strength    Rehab Potential  Good    PT Frequency  2x / week    PT Duration  12 weeks    PT Treatment/Interventions  Therapeutic exercise;Therapeutic activities;Functional mobility training;Stair training;Balance training;Neuromuscular re-education;Patient/family education;Aquatic Therapy    PT Next Visit Plan  mobility training, strengthening, balanace training    PT Home Exercise Plan  LAQ, hip flex  Consulted and Agree with Plan of Care  Patient;Family member/caregiver       Patient will benefit from skilled therapeutic intervention in order to improve the following deficits and impairments:  Decreased balance, Decreased endurance, Decreased mobility, Decreased safety awareness, Decreased  strength, Decreased activity tolerance  Visit Diagnosis: Other lack of coordination  Difficulty in walking, not elsewhere classified  Muscle weakness (generalized)  Parkinson's disease ([place])     Problem List Patient Active Problem List   Diagnosis Date Noted  . Pain and swelling of right lower extremity 01/02/2017  . Parkinson's disease ([employer]) 01/02/2017    Alanson Puls, PT DPT 10/25/2017, 1:51 PM  [place] MAIN [facility] SERVICES [address] [place], [place], [address] Phone: [phone]   Fax:  [phone]  Name: Karlie Aung MRN: [id number] Date of Birth: 1940-02-29

## 2017-10-30 ENCOUNTER — Ambulatory Visit: Payer: Medicare Other | Admitting: Physical Therapy

## 2017-10-30 ENCOUNTER — Encounter: Payer: Self-pay | Admitting: Physical Therapy

## 2017-10-30 DIAGNOSIS — M6281 Muscle weakness (generalized): Secondary | ICD-10-CM

## 2017-10-30 DIAGNOSIS — R278 Other lack of coordination: Secondary | ICD-10-CM

## 2017-10-30 DIAGNOSIS — R262 Difficulty in walking, not elsewhere classified: Secondary | ICD-10-CM

## 2017-10-30 DIAGNOSIS — G20 Parkinson's disease: Secondary | ICD-10-CM

## 2017-10-30 NOTE — Therapy (Signed)
[place] MAIN [facility] SERVICES [address] [place], [place], [address] Phone: [phone]   Fax:  [phone]  Physical Therapy Treatment  Patient Details  Name: Kelsey Owens MRN: [id number] Date of Birth: 21-Oct-1940 Referring Provider: Jennelle Human   Encounter Date: 10/30/2017  PT End of Session - 10/30/17 1455    Visit Number  21    Number of Visits  33    Date for PT Re-Evaluation  12/03/17    PT Start Time  0230    PT Stop Time  0310    PT Time Calculation (min)  40 min    Equipment Utilized During Treatment  Gait belt    Activity Tolerance  Patient tolerated treatment well    Behavior During Therapy  [facility] for tasks assessed/performed       Past Medical History:  Diagnosis Date  . Arthritis   . GERD (gastroesophageal reflux disease)   . Parkinson disease [facility])     Past Surgical History:  Procedure Laterality Date  . ABDOMINAL HYSTERECTOMY    . CHOLECYSTECTOMY    . DEEP BRAIN STIMULATOR PLACEMENT    . HEMORROIDECTOMY      There were no vitals filed for this visit.  Subjective Assessment - 10/30/17 1454    Subjective   Pt reports that she is having 6/10 pain in her right  shoulder today.   Patient had 2 falls resulting in need of stiches in the back of the head and also severe bruising on face and another laceration that required stiches. No specific questions or concerns at this time.    Patient is accompained by:  Family member    Pertinent History  Patient has had parkinsons disease for the last 20 years. Her mobility has been getting worse after she had a left radius fx.  She was walking with RW in 10/18.  Patient has worseing loss of balance beginning 10/18.  She had HHPT 2 x week for 2 months beginining after hospitilization in Dec 2018.     Patient Stated Goals  Patient wants to be able to walk better and not fall as often    Currently in Pain?  Yes    Pain Score  6     Pain Location  Shoulder    Pain Orientation   Right    Pain Descriptors / Indicators  Aching    Pain Type  Acute pain    Pain Onset  More than a month ago    Multiple Pain Sites  No       Treatment: Nu-step x 5 mins  BLE hip flex x 10 x 2  BLE LAQ x 10 x 2   Gait training with rollator 350 feet with wc following behind, knees flexed and small steps, decreased height and min assist  Therapeutic activities:  Transfer training from wc to stand with Rolator x 5, CGA and cues for locking and unlocking the rollator  Transfer training from wc to mat with Rolator x 3 with min assist for safety                         PT Education - 10/30/17 1454    Education Details  HEP, saftey with balance and gait     Person(s) Educated  Patient    Methods  Explanation    Comprehension  Verbalized understanding;Returned demonstration       PT Short Term Goals - 10/25/17 1317  PT SHORT TERM GOAL #1   Title  Patient will be independent in home exercise program to improve strength/mobility for better functional independence with ADLs.    Time  4    Period  Weeks    Status  Achieved    Target Date  10/25/17      PT SHORT TERM GOAL #2   Title  Patient will perofrm sit to stand with correct hand placement and without support on the back of the chair with RW  and SBA.     Baseline  Patient needs min assist and needs min assist for correct hand placement.    Time  4    Period  Weeks    Status  Partially Met    Target Date  12/03/17      PT SHORT TERM GOAL #3   Title  Patient will stand for 1 minute with UE support to be able to perform safe transfers.     Baseline  Patient can stand for 10 seconds    Time  4    Period  Weeks    Status  On-going    Target Date  12/03/17        PT Long Term Goals - 10/25/17 1315      PT LONG TERM GOAL #1   Title  Patient will be able to ambulate in parallel bars x 10 feet with min assist.    Baseline  ; Patient is ambulating wiht rollator 25 feet and min assist     Time  12    Period  Weeks    Status  Achieved    Target Date  12/03/17      PT LONG TERM GOAL #2   Title  Patient will ambulate short distances ,25 feet, with RW and mod assist.     Baseline  : 08/22/17 patient is ambulating 15 feet with RW:09/12/17 25 feet with min assist and rollator, 10/25/17    Time  12    Period  Weeks    Status  Partially Met    Target Date  12/03/17      PT LONG TERM GOAL #3   Title  Patient will be able to ascend 1 step with min assist with LARD.    Baseline  max assist with HHA    Time  12    Period  Weeks    Status  Partially Met    Target Date  12/03/17            Plan - 10/30/17 1456    Clinical Impression Statement  Patient demonstrates LE and core weakness, and impaired mobility and lack of LE strength that is causing difficulty with gait activities. Patient still fatigues quickly to due weak hip and ad and core muscles. Patient is able to perform exercises that were difficult several weeks ago. Patient will continue to benefit from skilled PT to improve with gait and mobility    Rehab Potential  Good    PT Frequency  2x / week    PT Duration  12 weeks    PT Treatment/Interventions  Therapeutic exercise;Therapeutic activities;Functional mobility training;Stair training;Balance training;Neuromuscular re-education;Patient/family education;Aquatic Therapy    PT Next Visit Plan  mobility training, strengthening, balanace training    PT Home Exercise Plan  LAQ, hip flex    Consulted and Agree with Plan of Care  Patient;Family member/caregiver       Patient will benefit from skilled therapeutic intervention in order to improve the following  deficits and impairments:  Decreased balance, Decreased endurance, Decreased mobility, Decreased safety awareness, Decreased strength, Decreased activity tolerance  Visit Diagnosis: Other lack of coordination  Difficulty in walking, not elsewhere classified  Muscle weakness (generalized)  Parkinson's disease  ([place])     Problem List Patient Active Problem List   Diagnosis Date Noted  . Pain and swelling of right lower extremity 01/02/2017  . Parkinson's disease ([place]) 01/02/2017    Alanson Puls, PT DPT 10/30/2017, 2:58 PM  [place] MAIN [facility] SERVICES [address] [place], [place], [address] Phone: [phone]   Fax:  [phone]  Name: Kelsey Owens MRN: [id number] Date of Birth: 1940-04-01

## 2017-11-01 ENCOUNTER — Encounter: Payer: Self-pay | Admitting: Physical Therapy

## 2017-11-01 ENCOUNTER — Ambulatory Visit: Payer: Medicare Other | Admitting: Physical Therapy

## 2017-11-01 DIAGNOSIS — M7989 Other specified soft tissue disorders: Secondary | ICD-10-CM

## 2017-11-01 DIAGNOSIS — M6281 Muscle weakness (generalized): Secondary | ICD-10-CM

## 2017-11-01 DIAGNOSIS — M79604 Pain in right leg: Secondary | ICD-10-CM

## 2017-11-01 DIAGNOSIS — G20A1 Parkinson's disease without dyskinesia, without mention of fluctuations: Secondary | ICD-10-CM

## 2017-11-01 DIAGNOSIS — G20 Parkinson's disease: Secondary | ICD-10-CM

## 2017-11-01 DIAGNOSIS — R262 Difficulty in walking, not elsewhere classified: Secondary | ICD-10-CM

## 2017-11-01 DIAGNOSIS — R278 Other lack of coordination: Secondary | ICD-10-CM | POA: Diagnosis not present

## 2017-11-01 NOTE — Therapy (Signed)
[place] MAIN [facility] SERVICES [address] [place], [place], [address] Phone: [phone]   Fax:  [phone]  Physical Therapy Treatment  Patient Details  Name: Kelsey Owens MRN: [id number] Date of Birth: 03/16/1940 Referring Provider: Jennelle Human   Encounter Date: 11/01/2017  PT End of Session - 11/01/17 1328    Visit Number  22    Number of Visits  33    Date for PT Re-Evaluation  12/03/17    PT Start Time  0105    PT Stop Time  0145    PT Time Calculation (min)  40 min    Equipment Utilized During Treatment  Gait belt    Activity Tolerance  Patient tolerated treatment well    Behavior During Therapy  [facility] for tasks assessed/performed       Past Medical History:  Diagnosis Date  . Arthritis   . GERD (gastroesophageal reflux disease)   . Parkinson disease [facility])     Past Surgical History:  Procedure Laterality Date  . ABDOMINAL HYSTERECTOMY    . CHOLECYSTECTOMY    . DEEP BRAIN STIMULATOR PLACEMENT    . HEMORROIDECTOMY      There were no vitals filed for this visit.  Subjective Assessment - 11/01/17 1327    Subjective   Pt reports that she is having 6/10 pain in her right  shoulder today.   Patient had 2 falls resulting in need of stiches in the back of the head and also severe bruising on face and another laceration that required stiches. No specific questions or concerns at this time.    Patient is accompained by:  Family member    Pertinent History  Patient has had parkinsons disease for the last 20 years. Her mobility has been getting worse after she had a left radius fx.  She was walking with RW in 10/18.  Patient has worseing loss of balance beginning 10/18.  She had HHPT 2 x week for 2 months beginining after hospitilization in Dec 2018.     Patient Stated Goals  Patient wants to be able to walk better and not fall as often    Currently in Pain?  Yes    Pain Score  6     Pain Location  Shoulder    Pain Orientation   Right    Pain Descriptors / Indicators  Aching    Pain Onset  More than a month ago    Aggravating Factors   using it     Pain Relieving Factors  nothing    Effect of Pain on Daily Activities  it slows her down    Multiple Pain Sites  No         Treatment:  Nu-step x 5 mins, level 1   BLE hip flex x 10 x 2  BLE LAQ x 10 x 2   Gait training with rollator 350 feet with wc following behind, knees flexed and small steps, decreased height and min assist. Patient has difficulty transferring her weight over her feet  Therapeutic activities:  Transfer training from wc to stand with Rolator x 5, CGA and cues for locking and unlocking the rollator  Transfer training from wc to mat with Rolator x 3 with min assist for safety                       PT Education - 11/01/17 1328    Education Details  HEP, saftey with balance and  gait     Person(s) Educated  Patient    Methods  Explanation;Demonstration    Comprehension  Returned demonstration;Verbalized understanding;Verbal cues required       PT Short Term Goals - 10/25/17 1317      PT SHORT TERM GOAL #1   Title  Patient will be independent in home exercise program to improve strength/mobility for better functional independence with ADLs.    Time  4    Period  Weeks    Status  Achieved    Target Date  10/25/17      PT SHORT TERM GOAL #2   Title  Patient will perofrm sit to stand with correct hand placement and without support on the back of the chair with RW  and SBA.     Baseline  Patient needs min assist and needs min assist for correct hand placement.    Time  4    Period  Weeks    Status  Partially Met    Target Date  12/03/17      PT SHORT TERM GOAL #3   Title  Patient will stand for 1 minute with UE support to be able to perform safe transfers.     Baseline  Patient can stand for 10 seconds    Time  4    Period  Weeks    Status  On-going    Target Date  12/03/17        PT Long Term  Goals - 10/25/17 1315      PT LONG TERM GOAL #1   Title  Patient will be able to ambulate in parallel bars x 10 feet with min assist.    Baseline  ; Patient is ambulating wiht rollator 25 feet and min assist    Time  12    Period  Weeks    Status  Achieved    Target Date  12/03/17      PT LONG TERM GOAL #2   Title  Patient will ambulate short distances ,25 feet, with RW and mod assist.     Baseline  : 08/22/17 patient is ambulating 15 feet with RW:09/12/17 25 feet with min assist and rollator, 10/25/17    Time  12    Period  Weeks    Status  Partially Met    Target Date  12/03/17      PT LONG TERM GOAL #3   Title  Patient will be able to ascend 1 step with min assist with LARD.    Baseline  max assist with HHA    Time  12    Period  Weeks    Status  Partially Met    Target Date  12/03/17            Plan - 11/01/17 1329    Clinical Impression Statement  Patient has decreased motor control and has freezing episodes today with gait. She performs trnasfers from sit to stand with mod assist and mod verbal cuing for correct sequencing. She is instructed in gait with rollator and min assist for keeping her in correct posture and encouraging weight bearing over LE's and not to posterir lean. she ambulates 350 feet with slow gait speed, narrow stance and shuffling feet with flexed knees and hips. She will continue to benefit from skilled PT to improve safety and mobility.     Rehab Potential  Good    PT Frequency  2x / week    PT Duration  12 weeks  PT Treatment/Interventions  Therapeutic exercise;Therapeutic activities;Functional mobility training;Stair training;Balance training;Neuromuscular re-education;Patient/family education;Aquatic Therapy    PT Next Visit Plan  mobility training, strengthening, balanace training    PT Home Exercise Plan  LAQ, hip flex    Consulted and Agree with Plan of Care  Patient;Family member/caregiver       Patient will benefit from skilled  therapeutic intervention in order to improve the following deficits and impairments:  Decreased balance, Decreased endurance, Decreased mobility, Decreased safety awareness, Decreased strength, Decreased activity tolerance  Visit Diagnosis: Other lack of coordination  Difficulty in walking, not elsewhere classified  Muscle weakness (generalized)  Parkinson's disease ([facility])  Pain and swelling of right lower extremity     Problem List Patient Active Problem List   Diagnosis Date Noted  . Pain and swelling of right lower extremity 01/02/2017  . Parkinson's disease ([place]) 01/02/2017    Alanson Puls, PT DPT 11/01/2017, 1:42 PM  [place] MAIN [facility] SERVICES [address] [place], [place], [address] Phone: [phone]   Fax:  [phone]  Name: Brandee Markin MRN: [id number] Date of Birth: 04-27-1940

## 2017-11-06 ENCOUNTER — Ambulatory Visit: Payer: Medicare Other | Admitting: Physical Therapy

## 2017-11-06 ENCOUNTER — Ambulatory Visit: Payer: Medicare Other | Admitting: Occupational Therapy

## 2017-11-06 ENCOUNTER — Encounter: Payer: Self-pay | Admitting: Physical Therapy

## 2017-11-06 DIAGNOSIS — R278 Other lack of coordination: Secondary | ICD-10-CM | POA: Diagnosis not present

## 2017-11-06 DIAGNOSIS — M6281 Muscle weakness (generalized): Secondary | ICD-10-CM

## 2017-11-06 DIAGNOSIS — G20 Parkinson's disease: Secondary | ICD-10-CM

## 2017-11-06 DIAGNOSIS — R262 Difficulty in walking, not elsewhere classified: Secondary | ICD-10-CM

## 2017-11-06 NOTE — Therapy (Signed)
[place] MAIN [facility] SERVICES [address] [place], [place], [address] Phone: [phone]   Fax:  [phone]  Physical Therapy Treatment  Patient Details  Name: Kelsey Owens MRN: [id number] Date of Birth: 06/21/1940 Referring Provider: Jennelle Human   Encounter Date: 11/06/2017  PT End of Session - 11/06/17 1548    Visit Number  23    Number of Visits  33    Date for PT Re-Evaluation  12/03/17    PT Start Time  0315    PT Stop Time  0400    PT Time Calculation (min)  45 min    Equipment Utilized During Treatment  Gait belt    Activity Tolerance  Patient tolerated treatment well    Behavior During Therapy  [facility] for tasks assessed/performed       Past Medical History:  Diagnosis Date  . Arthritis   . GERD (gastroesophageal reflux disease)   . Parkinson disease [facility])     Past Surgical History:  Procedure Laterality Date  . ABDOMINAL HYSTERECTOMY    . CHOLECYSTECTOMY    . DEEP BRAIN STIMULATOR PLACEMENT    . HEMORROIDECTOMY      There were no vitals filed for this visit.  Subjective Assessment - 11/06/17 1546    Subjective   Pt reports that she is having 6/10 pain in her right  shoulder today.      Patient is accompained by:  Family member    Pertinent History  Patient has had parkinsons disease for the last 20 years. Her mobility has been getting worse after she had a left radius fx.  She was walking with RW in 10/18.  Patient has worseing loss of balance beginning 10/18.  She had HHPT 2 x week for 2 months beginining after hospitilization in Dec 2018.     Patient Stated Goals  Patient wants to be able to walk better and not fall as often    Currently in Pain?  Yes    Pain Score  6     Pain Location  Shoulder    Pain Orientation  Right    Pain Descriptors / Indicators  Aching    Pain Onset  More than a month ago    Aggravating Factors   using her shoulder    Pain Relieving Factors  rest    Effect of Pain on Daily  Activities  difficult to co activiites        Treatment:  Nu-step x 5 mins, level 1   BLE hip flex x 10 x 2  BLE LAQ x 10 x 2  Gait training with rollator 350 feet x 1 , 150 feet with wc following behind, knees flexed and small steps, decreased height and min assist. Patient has difficulty transferring her weight over her feet  Therapeutic activities:  Transfer training from wc to stand with Rolator x 5, CGA and cues for locking and unlocking the rollator  Transfer training from wc to mat with Rolator x 3 with min assist for safety                         PT Education - 11/06/17 1548    Education Details  HEP    Person(s) Educated  Patient    Methods  Explanation    Comprehension  Verbalized understanding;Returned demonstration       PT Short Term Goals - 10/25/17 1317      PT SHORT TERM  GOAL #1   Title  Patient will be independent in home exercise program to improve strength/mobility for better functional independence with ADLs.    Time  4    Period  Weeks    Status  Achieved    Target Date  10/25/17      PT SHORT TERM GOAL #2   Title  Patient will perofrm sit to stand with correct hand placement and without support on the back of the chair with RW  and SBA.     Baseline  Patient needs min assist and needs min assist for correct hand placement.    Time  4    Period  Weeks    Status  Partially Met    Target Date  12/03/17      PT SHORT TERM GOAL #3   Title  Patient will stand for 1 minute with UE support to be able to perform safe transfers.     Baseline  Patient can stand for 10 seconds    Time  4    Period  Weeks    Status  On-going    Target Date  12/03/17        PT Long Term Goals - 10/25/17 1315      PT LONG TERM GOAL #1   Title  Patient will be able to ambulate in parallel bars x 10 feet with min assist.    Baseline  ; Patient is ambulating wiht rollator 25 feet and min assist    Time  12    Period  Weeks    Status   Achieved    Target Date  12/03/17      PT LONG TERM GOAL #2   Title  Patient will ambulate short distances ,25 feet, with RW and mod assist.     Baseline  : 08/22/17 patient is ambulating 15 feet with RW:09/12/17 25 feet with min assist and rollator, 10/25/17    Time  12    Period  Weeks    Status  Partially Met    Target Date  12/03/17      PT LONG TERM GOAL #3   Title  Patient will be able to ascend 1 step with min assist with LARD.    Baseline  max assist with HHA    Time  12    Period  Weeks    Status  Partially Met    Target Date  12/03/17            Plan - 11/06/17 1610    Clinical Impression Statement  Patient performs sit to stand transfers with mod verbal cuing for leaning fwd and correct hand placement. She performs gait training with rollator for 350 feet with CGA. She has decreased motor planning and gets frozen during transfer training. She will conitineu to benefit from skilled PT to improve mobility and strength    Rehab Potential  Good    PT Frequency  2x / week    PT Duration  12 weeks    PT Treatment/Interventions  Therapeutic exercise;Therapeutic activities;Functional mobility training;Stair training;Balance training;Neuromuscular re-education;Patient/family education;Aquatic Therapy    PT Next Visit Plan  mobility training, strengthening, balanace training    PT Home Exercise Plan  LAQ, hip flex    Consulted and Agree with Plan of Care  Patient;Family member/caregiver       Patient will benefit from skilled therapeutic intervention in order to improve the following deficits and impairments:  Decreased balance, Decreased endurance, Decreased mobility, Decreased  safety awareness, Decreased strength, Decreased activity tolerance  Visit Diagnosis: Other lack of coordination  Difficulty in walking, not elsewhere classified  Muscle weakness (generalized)  Parkinson's disease ([place])     Problem List Patient Active Problem List   Diagnosis Date Noted  .  Pain and swelling of right lower extremity 01/02/2017  . Parkinson's disease ([place]) 01/02/2017    Alanson Puls, PT DPT 11/06/2017, 4:13 PM  [place] MAIN [facility] SERVICES [address] [place], [place], [address] Phone: [phone]   Fax:  [phone]  Name: Kelsey Owens MRN: [id number] Date of Birth: 1940/06/01

## 2017-11-08 ENCOUNTER — Encounter: Payer: Self-pay | Admitting: Physical Therapy

## 2017-11-08 ENCOUNTER — Ambulatory Visit: Payer: Medicare Other | Admitting: Physical Therapy

## 2017-11-08 DIAGNOSIS — R278 Other lack of coordination: Secondary | ICD-10-CM

## 2017-11-08 DIAGNOSIS — R262 Difficulty in walking, not elsewhere classified: Secondary | ICD-10-CM

## 2017-11-08 DIAGNOSIS — M6281 Muscle weakness (generalized): Secondary | ICD-10-CM

## 2017-11-08 NOTE — Therapy (Signed)
[place] MAIN [facility] SERVICES [address] [place], [place], [address] Phone: [phone]   Fax:  [phone]  Physical Therapy Treatment  Patient Details  Name: Kelsey Owens MRN: [id number] Date of Birth: January 22, 1941 Referring Provider (PT): Jennelle Human   Encounter Date: 11/08/2017  PT End of Session - 11/08/17 1340    Visit Number  24    Number of Visits  33    Date for PT Re-Evaluation  12/03/17    Authorization Type  4/10    PT Start Time  0103    PT Stop Time  0145    PT Time Calculation (min)  42 min    Equipment Utilized During Treatment  Gait belt    Activity Tolerance  Patient tolerated treatment well    Behavior During Therapy  WFL for tasks assessed/performed       Past Medical History:  Diagnosis Date  . Arthritis   . GERD (gastroesophageal reflux disease)   . Parkinson disease [facility])     Past Surgical History:  Procedure Laterality Date  . ABDOMINAL HYSTERECTOMY    . CHOLECYSTECTOMY    . DEEP BRAIN STIMULATOR PLACEMENT    . HEMORROIDECTOMY      There were no vitals filed for this visit.  Subjective Assessment - 11/08/17 1337    Subjective   Pt reports that she had a shot in her left arm today and now she does not have any pain. Her lower leg had an area on it that has been there for 4 months and the MD instructed her to put an ace wrap on it. Her foot is swollen.     Patient is accompained by:  Family member    Pertinent History  Patient has had parkinsons disease for the last 20 years. Her mobility has been getting worse after she had a left radius fx.  She was walking with RW in 10/18.  Patient has worseing loss of balance beginning 10/18.  She had HHPT 2 x week for 2 months beginining after hospitilization in Dec 2018.     Patient Stated Goals  Patient wants to be able to walk better and not fall as often    Currently in Pain?  No/denies    Pain Score  0-No pain    Pain Onset  More than a month ago          Treatment:  Nu-step x 5 mins, level 1  BLE hip flex x 10 x 2  BLE LAQ x 10 x 2  Gait training with rollator 350 feet x 1 , 150 feet with wc following behind, knees flexed and small steps, decreased height and min assist. Patient has difficulty transferring her weight over her feet  Therapeutic activities:  Transfer training from wc to stand with Rolator x 5, CGA and cues for locking and unlocking the rollator  Transfer training from wc to mat with Rolator x 3 with min assist for safety                       PT Education - 11/08/17 1339    Education Details  HEP, satey, balance, need to call and let the MD know that her leg and ankle have been swollen also    Person(s) Educated  Patient;Spouse    Methods  Explanation;Demonstration;Verbal cues    Comprehension  Verbalized understanding;Returned demonstration;Need further instruction       PT Short Term Goals - 10/25/17  [place] #1   Title  Patient will be independent in home exercise program to improve strength/mobility for better functional independence with ADLs.    Time  4    Period  Weeks    Status  Achieved    Target Date  10/25/17      PT SHORT TERM GOAL #2   Title  Patient will perofrm sit to stand with correct hand placement and without support on the back of the chair with RW  and SBA.     Baseline  Patient needs min assist and needs min assist for correct hand placement.    Time  4    Period  Weeks    Status  Partially Met    Target Date  12/03/17      PT SHORT TERM GOAL #3   Title  Patient will stand for 1 minute with UE support to be able to perform safe transfers.     Baseline  Patient can stand for 10 seconds    Time  4    Period  Weeks    Status  On-going    Target Date  12/03/17        PT Long Term Goals - 10/25/17 1315      PT LONG TERM GOAL #1   Title  Patient will be able to ambulate in parallel bars x 10 feet with min assist.     Baseline  ; Patient is ambulating wiht rollator 25 feet and min assist    Time  12    Period  Weeks    Status  Achieved    Target Date  12/03/17      PT LONG TERM GOAL #2   Title  Patient will ambulate short distances ,25 feet, with RW and mod assist.     Baseline  : 08/22/17 patient is ambulating 15 feet with RW:09/12/17 25 feet with min assist and rollator, 10/25/17    Time  12    Period  Weeks    Status  Partially Met    Target Date  12/03/17      PT LONG TERM GOAL #3   Title  Patient will be able to ascend 1 step with min assist with LARD.    Baseline  max assist with HHA    Time  12    Period  Weeks    Status  Partially Met    Target Date  12/03/17            Plan - 11/08/17 1340    Clinical Impression Statement  Patient performs sit to stand transfers with mod verbal cuing for leaning fwd and correct hand placement. She performs gait training with rollator for 350 feet with CGA. She has decreased motor planning with small steps and decreased step height during gait.  She will conitinue to benefit from skilled PT to improve mobility and strength    Rehab Potential  Good    PT Frequency  2x / week    PT Duration  12 weeks    PT Treatment/Interventions  Therapeutic exercise;Therapeutic activities;Functional mobility training;Stair training;Balance training;Neuromuscular re-education;Patient/family education;Aquatic Therapy    PT Next Visit Plan  mobility training, strengthening, balanace training    PT Home Exercise Plan  LAQ, hip flex    Consulted and Agree with Plan of Care  Patient;Family member/caregiver       Patient will benefit from skilled therapeutic intervention in order to improve  the following deficits and impairments:  Decreased balance, Decreased endurance, Decreased mobility, Decreased safety awareness, Decreased strength, Decreased activity tolerance  Visit Diagnosis: Other lack of coordination  Difficulty in walking, not elsewhere classified  Muscle  weakness (generalized)     Problem List Patient Active Problem List   Diagnosis Date Noted  . Pain and swelling of right lower extremity 01/02/2017  . Parkinson's disease ([place]) 01/02/2017    Alanson Puls, PT DPT 11/08/2017, 1:42 PM  [place] MAIN [facility] SERVICES [address] [place], [place], [address] Phone: [phone]   Fax:  [phone]  Name: Kai Railsback MRN: [id number] Date of Birth: 1940-11-08

## 2017-11-12 ENCOUNTER — Encounter: Payer: Self-pay | Admitting: Physical Therapy

## 2017-11-12 ENCOUNTER — Ambulatory Visit: Payer: Medicare Other | Admitting: Occupational Therapy

## 2017-11-12 ENCOUNTER — Encounter: Payer: Self-pay | Admitting: Occupational Therapy

## 2017-11-12 ENCOUNTER — Ambulatory Visit: Payer: Medicare Other | Admitting: Physical Therapy

## 2017-11-12 DIAGNOSIS — M6281 Muscle weakness (generalized): Secondary | ICD-10-CM

## 2017-11-12 DIAGNOSIS — R278 Other lack of coordination: Secondary | ICD-10-CM

## 2017-11-12 DIAGNOSIS — G20 Parkinson's disease: Secondary | ICD-10-CM

## 2017-11-12 DIAGNOSIS — R262 Difficulty in walking, not elsewhere classified: Secondary | ICD-10-CM

## 2017-11-12 NOTE — Addendum Note (Signed)
Addended by: Lucia Bitter on: 11/12/2017 04:14 PM   Modules accepted: Orders

## 2017-11-12 NOTE — Therapy (Signed)
[place] MAIN [facility] SERVICES [address] [place], [place], [address] Phone: [phone]   Fax:  [phone]  Physical Therapy Treatment  Patient Details  Name: Kelsey Owens MRN: [id number] Date of Birth: 08-04-1940 Referring Provider (PT): Jennelle Human   Encounter Date: 11/12/2017  PT End of Session - 11/12/17 1610    Visit Number  25    Number of Visits  33    Date for PT Re-Evaluation  12/03/17    Authorization Type  5/10    PT Start Time  0150    PT Stop Time  0230    PT Time Calculation (min)  40 min    Equipment Utilized During Treatment  Gait belt    Activity Tolerance  Patient tolerated treatment well    Behavior During Therapy  WFL for tasks assessed/performed       Past Medical History:  Diagnosis Date  . Arthritis   . GERD (gastroesophageal reflux disease)   . Parkinson disease [facility])     Past Surgical History:  Procedure Laterality Date  . ABDOMINAL HYSTERECTOMY    . CHOLECYSTECTOMY    . DEEP BRAIN STIMULATOR PLACEMENT    . HEMORROIDECTOMY      There were no vitals filed for this visit.  Subjective Assessment - 11/12/17 1609    Subjective   Pt reports that she had a migrane today and is not feeling that great.     Patient is accompained by:  Family member    Pertinent History  Patient has had parkinsons disease for the last 20 years. Her mobility has been getting worse after she had a left radius fx.  She was walking with RW in 10/18.  Patient has worseing loss of balance beginning 10/18.  She had HHPT 2 x week for 2 months beginining after hospitilization in Dec 2018.     Patient Stated Goals  Patient wants to be able to walk better and not fall as often    Currently in Pain?  No/denies    Pain Score  0-No pain    Pain Onset  More than a month ago    Multiple Pain Sites  No       Treatment:  Nu-step x 5 mins, level 1  BLE hip flex x 10 x 2  BLE LAQ x 10 x 2  Gait training with rollator 100  feetx 1 , 50 feetwith wc following behind, knees flexed and small steps, decreased height and min assist. Patient has difficulty transferring her weight over her feet  Therapeutic activities:  Transfer training from wc to stand with Rolator x 5, CGA and cues for locking and unlocking the rollator  Transfer training from wc to mat with Rolator x 3 with min assist for safety                        PT Education - 11/12/17 1609    Education Details  HEP, satey    Person(s) Educated  Patient    Methods  Explanation;Demonstration    Comprehension  Verbalized understanding;Returned demonstration;Need further instruction       PT Short Term Goals - 10/25/17 1317      PT SHORT TERM GOAL #1   Title  Patient will be independent in home exercise program to improve strength/mobility for better functional independence with ADLs.    Time  4    Period  Weeks    Status  Achieved  Target Date  10/25/17      PT SHORT TERM GOAL #2   Title  Patient will perofrm sit to stand with correct hand placement and without support on the back of the chair with RW  and SBA.     Baseline  Patient needs min assist and needs min assist for correct hand placement.    Time  4    Period  Weeks    Status  Partially Met    Target Date  12/03/17      PT SHORT TERM GOAL #3   Title  Patient will stand for 1 minute with UE support to be able to perform safe transfers.     Baseline  Patient can stand for 10 seconds    Time  4    Period  Weeks    Status  On-going    Target Date  12/03/17        PT Long Term Goals - 10/25/17 1315      PT LONG TERM GOAL #1   Title  Patient will be able to ambulate in parallel bars x 10 feet with min assist.    Baseline  ; Patient is ambulating wiht rollator 25 feet and min assist    Time  12    Period  Weeks    Status  Achieved    Target Date  12/03/17      PT LONG TERM GOAL #2   Title  Patient will ambulate short distances ,25 feet, with RW and  mod assist.     Baseline  : 08/22/17 patient is ambulating 15 feet with RW:09/12/17 25 feet with min assist and rollator, 10/25/17    Time  12    Period  Weeks    Status  Partially Met    Target Date  12/03/17      PT LONG TERM GOAL #3   Title  Patient will be able to ascend 1 step with min assist with LARD.    Baseline  max assist with HHA    Time  12    Period  Weeks    Status  Partially Met    Target Date  12/03/17            Plan - 11/12/17 1611    Clinical Impression Statement  Patient performs sit to stand transfers with mod verbal cuing for leaning fwd and correct hand placement. She performs gait training with rollator for 350 feet with CGA. She has decreased motor planning and gets frozen during transfer training. She will conitineu to benefit from skilled PT to improve mobility and strength    Rehab Potential  Good    PT Frequency  2x / week    PT Duration  12 weeks    PT Treatment/Interventions  Therapeutic exercise;Therapeutic activities;Functional mobility training;Stair training;Balance training;Neuromuscular re-education;Patient/family education;Aquatic Therapy    PT Next Visit Plan  mobility training, strengthening, balanace training    PT Home Exercise Plan  LAQ, hip flex    Consulted and Agree with Plan of Care  Patient;Family member/caregiver       Patient will benefit from skilled therapeutic intervention in order to improve the following deficits and impairments:  Decreased balance, Decreased endurance, Decreased mobility, Decreased safety awareness, Decreased strength, Decreased activity tolerance  Visit Diagnosis: Other lack of coordination  Difficulty in walking, not elsewhere classified  Muscle weakness (generalized)  Parkinson's disease ([place])     Problem List Patient Active Problem List   Diagnosis Date Noted  .  Pain and swelling of right lower extremity 01/02/2017  . Parkinson's disease ([place]) 01/02/2017    Alanson Puls, PT  DPT 11/12/2017, 4:12 PM  [place] MAIN [facility] SERVICES [address] [place], [place], [address] Phone: [phone]   Fax:  [phone]  Name: Kelsey Owens MRN: [id number] Date of Birth: Jun 30, 1940

## 2017-11-12 NOTE — Therapy (Signed)
[place] MAIN [facility] SERVICES [address] [place], [place], [address] Phone: [phone]   Fax:  [phone]  Occupational Therapy/Recertification Note  Patient Details  Name: Kelsey Owens MRN: [id number] Date of Birth: [birth year] No data recorded  Encounter Date: 11/12/2017  OT End of Session - 11/12/17 1527    Visit Number  15    Number of Visits  24    Date for OT Re-Evaluation  02/04/18    Authorization Time Period  Visit 1 of 10 for progress report period starting 11/12/2017    OT Start Time  1345    OT Stop Time  1427    OT Time Calculation (min)  42 min    Activity Tolerance  Patient tolerated treatment well    Behavior During Therapy  [facility] for tasks assessed/performed       Past Medical History:  Diagnosis Date  . Arthritis   . GERD (gastroesophageal reflux disease)   . Parkinson disease [facility])     Past Surgical History:  Procedure Laterality Date  . ABDOMINAL HYSTERECTOMY    . CHOLECYSTECTOMY    . DEEP BRAIN STIMULATOR PLACEMENT    . HEMORROIDECTOMY      There were no vitals filed for this visit.  Subjective Assessment - 11/12/17 1525    Subjective   Pt. reports that she wants to continue working on [facility] skills.    Patient is accompained by:  Family member    Pertinent History  Pt. is a 77 y.o. female with a history of Parkinsons Disease. Pt. has a Deep Brain Stimulator, and has recently had it adjusted. Pt. has had multiple falls, and has a history of multiple wrist fractures.     Patient Stated Goals  To be as independent as possible.     Currently in Pain?  Yes   Right shoulder pain with movement, 5-6/10, 0/10 at rest.   Pain Score  6     Pain Location  Shoulder    Pain Orientation  Right    Pain Descriptors / Indicators  Aching    Pain Type  Acute pain    Pain Onset  More than a month ago            [place] OT Assessment - 11/12/17 1532      Written Expression   Handwriting  --   1-10 word sentence  in 56 sec. with 25% legibility.     Coordination   Right 9 Hole Peg Test  53 sec.    Left 9 Hole Peg Test  1 min. & 36 sec.      Strength   Overall Strength Comments  LUE 4/5, RUE 3/5 shoulder flexion, and abduction, elbow flexion, extension 4-/5, wirst extension 4-/5      Hand Function   Right Hand Grip (lbs)  36    Right Hand Lateral Pinch  13 lbs    Right Hand 3 Point Pinch  10 lbs    Left Hand Grip (lbs)  30    Left Hand Lateral Pinch  13 lbs    Left 3 point pinch  11 lbs      OT Treatment  Measurements were obtained for BUE strength, grip strength, pinch strength, FMC, hand function during ADLs/IADLs, and writing.  Goals were reviewed updated, and modified with the patient.                  OT Education - 11/12/17 1526    Education  Details  [facility] Skills    Person(s) Educated  Patient    Methods  Explanation;Demonstration    Comprehension  Verbalized understanding;Returned demonstration          OT Long Term Goals - 11/12/17 1441      OT LONG TERM GOAL #1   Title  Pt. will improve BUE strength by 2 muscle grades to improve ADL, and IADL functioning.    Baseline  Pt. conitnues to present with decreased UE strength    Time  12    Period  Weeks    Status  On-going    Target Date  02/04/18      OT LONG TERM GOAL #2   Title  Pt. will increase Left grip strength by 5# to be able to open jars, and containers.    Baseline  Limited grip strength    Time  12    Period  Weeks    Status  On-going    Target Date  02/04/18      OT LONG TERM GOAL #3   Title  Pt. will improve left hand [facility] skills  to be able to button clothing efficiently    Baseline  Pt. has difficulty manipulating buttons efficiently.    Time  12    Period  Weeks    Status  On-going    Target Date  02/04/18      OT LONG TERM GOAL #4   Title  Pt. will independently write one sentence efficiently with 75% legibility.    Baseline  One 10 work sentence with 25% legibility in 56 sec. with  deviation from the line.    Time  12    Period  Weeks    Status  On-going    Target Date  02/04/18      OT LONG TERM GOAL #5   Title  Pt. independently demonstrate work simplification strategies, and compensatory startegies for safe IADL tasks/home management tasks.       Baseline  Pt. continues to have difficulty    Time  12    Period  Weeks    Status  On-going    Target Date  02/04/18      OT LONG TERM GOAL #6   Title  Pt. will independently demonstrate work simplification strategies, and compensatory startegies within the kitchen from w/c height.    Baseline  Continue    Time  12    Period  Weeks    Status  On-going    Target Date  02/04/18      OT LONG TERM GOAL #7   Title  Pt. will be able to complete toilet hygiene care with supervision.    Baseline  Pt. is unable to perform the task.    Time  12    Period  Weeks    Status  New    Target Date  02/04/18            Plan - 11/12/17 1528    Clinical Impression Statement Pt. has had a fall with a laceration to the head since the last visit one month ago. Pt. reports having had a migraine headache earlier in the morning today. Pt. presents with decreased [facility] skills in the left, and right hands. Pt. continues to present with impaired bilateral grip strength. MAM-20 sum score was 71/80. Pt. continues to work on improving UE strength, and [facility] skills in preparation for improving ADLs, toileting hygiene care, IADLs, and writing.    Occupational Profile  and client history currently impacting functional performance  Pt. resides with her husband who is a Theme park manager. Pt. has grown children.    Occupational performance deficits (Please refer to evaluation for details):  ADL's;IADL's    Current Impairments/barriers affecting progress:  Positive Barriers: age, motivation, family support. Negative Barriers: multiple comorbidities, history of multiple falls.    OT Frequency  2x / week    OT Duration  12 weeks    OT Treatment/Interventions   Self-care/ADL training;Neuromuscular education;Therapeutic exercise;DME and/or AE instruction;Visual/perceptual remediation/compensation;Patient/family education;Therapeutic activities    Clinical Decision Making  Several treatment options, min-mod task modification necessary    Consulted and Agree with Plan of Care  Patient       Patient will benefit from skilled therapeutic intervention in order to improve the following deficits and impairments:  Abnormal gait, Decreased cognition, Pain, Decreased strength, Impaired UE functional use, Decreased activity tolerance, Decreased balance  Visit Diagnosis: Muscle weakness (generalized)  Other lack of coordination    Problem List Patient Active Problem List   Diagnosis Date Noted  . Pain and swelling of right lower extremity 01/02/2017  . Parkinson's disease ([place]) 01/02/2017    Harrel Carina, MS, OTR/L 11/12/2017, 3:59 PM  [place] MAIN [facility] SERVICES [address] [place], [place], [address] Phone: [phone]   Fax:  [phone]  Name: Kelsey Owens MRN: [id number] Date of Birth: 1940-04-04

## 2017-11-13 ENCOUNTER — Ambulatory Visit: Payer: Medicare Other | Admitting: Physical Therapy

## 2017-11-13 ENCOUNTER — Encounter: Payer: Medicare Other | Admitting: Occupational Therapy

## 2017-11-14 ENCOUNTER — Encounter: Payer: Medicare Other | Admitting: Occupational Therapy

## 2017-11-14 ENCOUNTER — Ambulatory Visit: Payer: Medicare Other | Admitting: Physical Therapy

## 2017-11-20 ENCOUNTER — Ambulatory Visit: Payer: Medicare Other | Attending: Family Medicine | Admitting: Physical Therapy

## 2017-11-20 ENCOUNTER — Encounter: Payer: Self-pay | Admitting: Occupational Therapy

## 2017-11-20 ENCOUNTER — Ambulatory Visit: Payer: Medicare Other | Admitting: Occupational Therapy

## 2017-11-20 ENCOUNTER — Encounter: Payer: Self-pay | Admitting: Physical Therapy

## 2017-11-20 DIAGNOSIS — R262 Difficulty in walking, not elsewhere classified: Secondary | ICD-10-CM | POA: Diagnosis present

## 2017-11-20 DIAGNOSIS — G20 Parkinson's disease: Secondary | ICD-10-CM | POA: Insufficient documentation

## 2017-11-20 DIAGNOSIS — R278 Other lack of coordination: Secondary | ICD-10-CM | POA: Diagnosis present

## 2017-11-20 DIAGNOSIS — M6281 Muscle weakness (generalized): Secondary | ICD-10-CM | POA: Diagnosis present

## 2017-11-20 NOTE — Therapy (Signed)
[place] MAIN [facility] SERVICES [address] [place], [place], [address] Phone: [phone]   Fax:  [phone]  Occupational Therapy Treatment  Patient Details  Name: Kelsey Owens MRN: [id number] Date of Birth: Apr 20, 1940 No data recorded  Encounter Date: 11/20/2017  OT End of Session - 11/20/17 1751    Visit Number  16    Number of Visits  24    Date for OT Re-Evaluation  02/04/18    Authorization Time Period  Visit 2 of 10 for progress report period starting 11/12/2017    OT Start Time  1630    OT Stop Time  1715    OT Time Calculation (min)  45 min    Activity Tolerance  Patient tolerated treatment well    Behavior During Therapy  [facility] for tasks assessed/performed       Past Medical History:  Diagnosis Date  . Arthritis   . GERD (gastroesophageal reflux disease)   . Parkinson disease [facility])     Past Surgical History:  Procedure Laterality Date  . ABDOMINAL HYSTERECTOMY    . CHOLECYSTECTOMY    . DEEP BRAIN STIMULATOR PLACEMENT    . HEMORROIDECTOMY      There were no vitals filed for this visit.  Subjective Assessment - 11/20/17 1750    Subjective   Pt. rpeorts she feels like she is improving    Patient is accompained by:  Family member    Pertinent History  Pt. is a 77 y.o. female with a history of Parkinsons Disease. Pt. has a Deep Brain Stimulator, and has recently had it adjusted. Pt. has had multiple falls, and has a history of multiple wrist fractures.     Patient Stated Goals  To be as independent as possible.        OT TREATMENT    Neuro muscular re-education:  Pt. worked on grasping, and opening various sizes of medication bottles. Pt. Had difficulty with opening small child proof containers. Pt. Worked on grasping 2" sticks, and placing them into a resistive mat. Pt. Worked on removing them them with 2# resistive tweezers. Pt. Required constant visual cues, and visual demonstration to grasp, and remove them with  the resistive tweezers.                          OT Education - 11/20/17 1751    Education Details  [facility] Skills    Person(s) Educated  Patient    Methods  Explanation;Demonstration    Comprehension  Verbalized understanding;Returned demonstration          OT Long Term Goals - 11/12/17 1441      OT LONG TERM GOAL #1   Title  Pt. will improve BUE strength by 2 muscle grades to improve ADL, and IADL functioning.    Baseline  Pt. conitnues to present with decreased UE strength    Time  12    Period  Weeks    Status  On-going    Target Date  02/04/18      OT LONG TERM GOAL #2   Title  Pt. will increase Left grip strength by 5# to be able to open jars, and containers.    Baseline  Limited grip strength    Time  12    Period  Weeks    Status  On-going    Target Date  02/04/18      OT LONG TERM GOAL #3   Title  Pt. will improve left hand [facility] skills  to be able to button clothing efficiently    Baseline  Pt. has difficulty manipulating buttons efficiently.    Time  12    Period  Weeks    Status  On-going    Target Date  02/04/18      OT LONG TERM GOAL #4   Title  Pt. will independently write one sentence efficiently with 75% legibility.    Baseline  One 10 work sentence with 25% legibility in 56 sec. with deviation from the line.    Time  12    Period  Weeks    Status  On-going    Target Date  02/04/18      OT LONG TERM GOAL #5   Title  Pt. independently demonstrate work simplification strategies, and compensatory startegies for safe IADL tasks/home management tasks.       Baseline  Pt. continues to have difficulty    Time  12    Period  Weeks    Status  On-going    Target Date  02/04/18      OT LONG TERM GOAL #6   Title  Pt. will independently demonstrate work simplification strategies, and compensatory startegies within the kitchen from w/c height.    Baseline  Continue    Time  12    Period  Weeks    Status  On-going    Target Date   02/04/18      OT LONG TERM GOAL #7   Title  Pt. will be able to complete toilet hygiene care with supervision.    Baseline  Pt. is unable to perform the task.    Time  12    Period  Weeks    Status  New    Target Date  02/04/18            Plan - 11/20/17 1751    Clinical Impression Statement  Pt. is recovering from her fall. Pt. continues to present with impaired [facility] skill, limited grip strength, pinch strength, ADL functioning. Pt. continues to requires verbal cues for direction, visual demonstration, and visual cues. Pt. continues to work on improving BUE strength, grip strength, and [place] skills in order to improve, and increase engagement during ADLs, and IADLs.    Occupational Profile and client history currently impacting functional performance  Pt. resides with her husband who is a Theme park manager. Pt. has grown children.    Occupational performance deficits (Please refer to evaluation for details):  ADL's;IADL's    Current Impairments/barriers affecting progress:  Positive Barriers: age, motivation, family support. Negative Barriers: multiple comorbidities, history of multiple falls.    OT Frequency  2x / week    OT Duration  12 weeks    OT Treatment/Interventions  Self-care/ADL training;Neuromuscular education;Therapeutic exercise;DME and/or AE instruction;Visual/perceptual remediation/compensation;Patient/family education;Therapeutic activities    Clinical Decision Making  Several treatment options, min-mod task modification necessary    Consulted and Agree with Plan of Care  Patient       Patient will benefit from skilled therapeutic intervention in order to improve the following deficits and impairments:  Abnormal gait, Decreased cognition, Pain, Decreased strength, Impaired UE functional use, Decreased activity tolerance, Decreased balance  Visit Diagnosis: Muscle weakness (generalized)  Other lack of coordination    Problem List Patient Active Problem List   Diagnosis Date  Noted  . Pain and swelling of right lower extremity 01/02/2017  . Parkinson's disease ([place]) 01/02/2017    Harrel Carina, MS, OTR/L  11/20/2017, 6:37 PM  [place] MAIN [facility] SERVICES [address] [place], [place], [address] Phone: [phone]   Fax:  [phone]  Name: Kelsey Owens MRN: [id number] Date of Birth: [birth year]

## 2017-11-20 NOTE — Therapy (Signed)
[place] MAIN [facility] SERVICES [address] [place], [place], [address] Phone: [phone]   Fax:  [phone]  Physical Therapy Treatment  Patient Details  Name: Kelsey Owens MRN: [id number] Date of Birth: 05/24/1940 Referring Provider (PT): Jennelle Human   Encounter Date: 11/20/2017  PT End of Session - 11/20/17 1701    Visit Number  26    Number of Visits  33    Date for PT Re-Evaluation  12/03/17    Authorization Type  6/10    PT Start Time  0350    PT Stop Time  0430    PT Time Calculation (min)  40 min    Equipment Utilized During Treatment  Gait belt    Activity Tolerance  Patient tolerated treatment well    Behavior During Therapy  WFL for tasks assessed/performed       Past Medical History:  Diagnosis Date  . Arthritis   . GERD (gastroesophageal reflux disease)   . Parkinson disease [facility])     Past Surgical History:  Procedure Laterality Date  . ABDOMINAL HYSTERECTOMY    . CHOLECYSTECTOMY    . DEEP BRAIN STIMULATOR PLACEMENT    . HEMORROIDECTOMY      There were no vitals filed for this visit.  Subjective Assessment - 11/20/17 1659    Subjective   Pt reports that she had a migrane today and is not feeling that great.     Patient is accompained by:  Family member    Pertinent History  Patient has had parkinsons disease for the last 20 years. Her mobility has been getting worse after she had a left radius fx.  She was walking with RW in 10/18.  Patient has worseing loss of balance beginning 10/18.  She had HHPT 2 x week for 2 months beginining after hospitilization in Dec 2018.     Patient Stated Goals  Patient wants to be able to walk better and not fall as often    Currently in Pain?  No/denies    Pain Score  0-No pain    Pain Onset  More than a month ago       Treatment:  Nu-step x 5 mins, level 1  BLE hip flex x 10 x 2  BLE LAQ x 10 x 2  Gait training with rollator 350 feetx 1 , 150 feetwith wc  following behind, knees flexed and small steps, decreased height and min assist. Patient has difficulty transferring her weight over her feet  Therapeutic activities:  Transfer training from wc to stand with Rolator x 5, CGA and cues for locking and unlocking the rollator  Transfer training from wc to mat with Rolator x 3 with min assist for safety                        PT Education - 11/20/17 1700    Education Details  HEP    Person(s) Educated  Patient    Methods  Explanation    Comprehension  Verbalized understanding;Verbal cues required;Need further instruction       PT Short Term Goals - 10/25/17 1317      PT SHORT TERM GOAL #1   Title  Patient will be independent in home exercise program to improve strength/mobility for better functional independence with ADLs.    Time  4    Period  Weeks    Status  Achieved    Target Date  10/25/17  PT SHORT TERM GOAL #2   Title  Patient will perofrm sit to stand with correct hand placement and without support on the back of the chair with RW  and SBA.     Baseline  Patient needs min assist and needs min assist for correct hand placement.    Time  4    Period  Weeks    Status  Partially Met    Target Date  12/03/17      PT SHORT TERM GOAL #3   Title  Patient will stand for 1 minute with UE support to be able to perform safe transfers.     Baseline  Patient can stand for 10 seconds    Time  4    Period  Weeks    Status  On-going    Target Date  12/03/17        PT Long Term Goals - 10/25/17 1315      PT LONG TERM GOAL #1   Title  Patient will be able to ambulate in parallel bars x 10 feet with min assist.    Baseline  ; Patient is ambulating wiht rollator 25 feet and min assist    Time  12    Period  Weeks    Status  Achieved    Target Date  12/03/17      PT LONG TERM GOAL #2   Title  Patient will ambulate short distances ,25 feet, with RW and mod assist.     Baseline  : 08/22/17 patient is  ambulating 15 feet with RW:09/12/17 25 feet with min assist and rollator, 10/25/17    Time  12    Period  Weeks    Status  Partially Met    Target Date  12/03/17      PT LONG TERM GOAL #3   Title  Patient will be able to ascend 1 step with min assist with LARD.    Baseline  max assist with HHA    Time  12    Period  Weeks    Status  Partially Met    Target Date  12/03/17            Plan - 11/20/17 1703    Clinical Impression Statement  Pt requires direction and verbal cues for correct performance of exercises. Patient demonstrates weakness in BLE and performs open and closed chain exercises with no reports of pain. Pt was able to perform all exercises with max assist and VC for technique..   Patient struggles with speed during movement as well as balance with unstable surfaces.  Pt encouraged continuing new supine HEP .Follow-up as scheduled.    Rehab Potential  Good    PT Frequency  2x / week    PT Duration  12 weeks    PT Treatment/Interventions  Therapeutic exercise;Therapeutic activities;Functional mobility training;Stair training;Balance training;Neuromuscular re-education;Patient/family education;Aquatic Therapy    PT Next Visit Plan  mobility training, strengthening, balanace training    PT Home Exercise Plan  LAQ, hip flex    Consulted and Agree with Plan of Care  Patient;Family member/caregiver       Patient will benefit from skilled therapeutic intervention in order to improve the following deficits and impairments:  Decreased balance, Decreased endurance, Decreased mobility, Decreased safety awareness, Decreased strength, Decreased activity tolerance  Visit Diagnosis: Muscle weakness (generalized)  Other lack of coordination  Difficulty in walking, not elsewhere classified  Parkinson's disease [facility])     Problem List Patient Active Problem  List   Diagnosis Date Noted  . Pain and swelling of right lower extremity 01/02/2017  . Parkinson's disease ([place])  01/02/2017    Alanson Puls, PT DPT 11/20/2017, 5:08 PM  [place] MAIN [facility] SERVICES [address] [place], [place], [address] Phone: [phone]   Fax:  [phone]  Name: Huntley Demedeiros MRN: [id number] Date of Birth: [birth year]

## 2017-11-22 ENCOUNTER — Encounter: Payer: Self-pay | Admitting: Physical Therapy

## 2017-11-22 ENCOUNTER — Encounter: Payer: Self-pay | Admitting: Occupational Therapy

## 2017-11-22 ENCOUNTER — Ambulatory Visit: Payer: Medicare Other | Admitting: Occupational Therapy

## 2017-11-22 ENCOUNTER — Ambulatory Visit: Payer: Medicare Other | Admitting: Physical Therapy

## 2017-11-22 DIAGNOSIS — M6281 Muscle weakness (generalized): Secondary | ICD-10-CM

## 2017-11-22 DIAGNOSIS — R278 Other lack of coordination: Secondary | ICD-10-CM

## 2017-11-22 DIAGNOSIS — G20 Parkinson's disease: Secondary | ICD-10-CM

## 2017-11-22 DIAGNOSIS — R262 Difficulty in walking, not elsewhere classified: Secondary | ICD-10-CM

## 2017-11-22 NOTE — Therapy (Signed)
[place] MAIN [facility] SERVICES [address] [place], [place], [address] Phone: [phone]   Fax:  [phone]  Occupational Therapy Treatment  Patient Details  Name: Kelsey Owens MRN: [id number] Date of Birth: [birth year] No data recorded  Encounter Date: 11/22/2017  OT End of Session - 11/22/17 1723    Visit Number  17    Number of Visits  24    Date for OT Re-Evaluation  02/04/18    Authorization Time Period  Visit 2 of 10 for progress report period starting 11/12/2017    OT Start Time  1600    OT Stop Time  1645    OT Time Calculation (min)  45 min    Activity Tolerance  Patient tolerated treatment well    Behavior During Therapy  [facility] for tasks assessed/performed       Past Medical History:  Diagnosis Date  . Arthritis   . GERD (gastroesophageal reflux disease)   . Parkinson disease [facility])     Past Surgical History:  Procedure Laterality Date  . ABDOMINAL HYSTERECTOMY    . CHOLECYSTECTOMY    . DEEP BRAIN STIMULATOR PLACEMENT    . HEMORROIDECTOMY      There were no vitals filed for this visit.  Subjective Assessment - 11/22/17 1722    Subjective   Pt. reports she is feeling better.    Patient is accompained by:  Family member    Pertinent History  Pt. is a 77 y.o. female with a history of Parkinsons Disease. Pt. has a Deep Brain Stimulator, and has recently had it adjusted. Pt. has had multiple falls, and has a history of multiple wrist fractures.     Patient Stated Goals  To be as independent as possible.     Currently in Pain?  No/denies      OT TREATMENT    Neuro muscular re-education:  Pt. worked on [facility] skills grasping, and manipulating 1/2" washers reaching across midline to place them over vertical dowels angled to the right, and left. Pt. required cues to grasp, and remove the washers one at a time with her thumb, and 2nd digit. Pt. required consistent cues. Pt. worked on grasping, and positioning magnetic hooks  on a whiteboard positioned at a vertical angle on the tabletop. Pt. worked on [facility] skills grasping 1/2" flat washers, and placing them on the hooks.   Self-Care:  Pt. performed toileting transfers with MinA, and cues for direction during then turn, and hand placement. Pt. required increased time to initiate transitioning from standing to sitting. Pt. was able to doff clothing with minA, and hike pants with modA over hips. Pt. Required maxA to zip pant, and fasten her pant button.Pt. was able to complete toilet hygiene skills independently after urination.Pt. was independent with hand performing hand hygiene following toileting.                           OT Education - 11/22/17 1722    Education Details  [facility] Skills, toileting safety    Person(s) Educated  Patient    Methods  Explanation;Demonstration    Comprehension  Verbalized understanding;Returned demonstration          OT Long Term Goals - 11/12/17 1441      OT LONG TERM GOAL #1   Title  Pt. will improve BUE strength by 2 muscle grades to improve ADL, and IADL functioning.    Baseline  Pt. conitnues to  present with decreased UE strength    Time  12    Period  Weeks    Status  On-going    Target Date  02/04/18      OT LONG TERM GOAL #2   Title  Pt. will increase Left grip strength by 5# to be able to open jars, and containers.    Baseline  Limited grip strength    Time  12    Period  Weeks    Status  On-going    Target Date  02/04/18      OT LONG TERM GOAL #3   Title  Pt. will improve left hand [facility] skills  to be able to button clothing efficiently    Baseline  Pt. has difficulty manipulating buttons efficiently.    Time  12    Period  Weeks    Status  On-going    Target Date  02/04/18      OT LONG TERM GOAL #4   Title  Pt. will independently write one sentence efficiently with 75% legibility.    Baseline  One 10 work sentence with 25% legibility in 56 sec. with deviation from the line.    Time   12    Period  Weeks    Status  On-going    Target Date  02/04/18      OT LONG TERM GOAL #5   Title  Pt. independently demonstrate work simplification strategies, and compensatory startegies for safe IADL tasks/home management tasks.       Baseline  Pt. continues to have difficulty    Time  12    Period  Weeks    Status  On-going    Target Date  02/04/18      OT LONG TERM GOAL #6   Title  Pt. will independently demonstrate work simplification strategies, and compensatory startegies within the kitchen from w/c height.    Baseline  Continue    Time  12    Period  Weeks    Status  On-going    Target Date  02/04/18      OT LONG TERM GOAL #7   Title  Pt. will be able to complete toilet hygiene care with supervision.    Baseline  Pt. is unable to perform the task.    Time  12    Period  Weeks    Status  New    Target Date  02/04/18            Plan - 11/22/17 1723    Clinical Impression Statement Pt. is making progress overall. Pt. continues to present with limited UE strength, and [facility] skills. Pt. continues to require work on improving toileting tasks, toilet hygiene skills, and bilateral hand strength, and coordination skills to be able to hike clothing, and manipulate buttons, and zippers following toileting. Pt. continues to work on improving ADL, and IADL functioning.     Occupational Profile and client history currently impacting functional performance  Pt. resides with her husband who is a Theme park manager. Pt. has grown children.    Occupational performance deficits (Please refer to evaluation for details):  ADL's;IADL's    Current Impairments/barriers affecting progress:  Positive Barriers: age, motivation, family support. Negative Barriers: multiple comorbidities, history of multiple falls.    OT Frequency  2x / week    OT Duration  12 weeks    OT Treatment/Interventions  Self-care/ADL training;Neuromuscular education;Therapeutic exercise;DME and/or AE instruction;Visual/perceptual  remediation/compensation;Patient/family education;Therapeutic activities    Clinical Decision Making  Several treatment options, min-mod task modification necessary    Consulted and Agree with Plan of Care  Patient       Patient will benefit from skilled therapeutic intervention in order to improve the following deficits and impairments:  Abnormal gait, Decreased cognition, Pain, Decreased strength, Impaired UE functional use, Decreased activity tolerance, Decreased balance  Visit Diagnosis: Muscle weakness (generalized)  Other lack of coordination    Problem List Patient Active Problem List   Diagnosis Date Noted  . Pain and swelling of right lower extremity 01/02/2017  . Parkinson's disease ([place]) 01/02/2017    Harrel Carina, MS, OTR/L 11/22/2017, 5:30 PM  [place] MAIN [facility] SERVICES [address] [place], [place], [address] Phone: [phone]   Fax:  [phone]  Name: Kelsey Owens MRN: [id number] Date of Birth: 1941/01/06

## 2017-11-22 NOTE — Therapy (Signed)
[place] MAIN [facility] SERVICES [address] [place], [place], [address] Phone: [phone]   Fax:  [phone]  Physical Therapy Treatment  Patient Details  Name: [place] Owens MRN: [id number] Date of Birth: 03/04/1940 Referring Provider (PT): Jennelle Human   Encounter Date: 11/22/2017  PT End of Session - 11/22/17 1739    Visit Number  27    Number of Visits  33    Date for PT Re-Evaluation  12/03/17    Authorization Type  7/10    PT Start Time  0445    PT Stop Time  0525    PT Time Calculation (min)  40 min    Equipment Utilized During Treatment  Gait belt    Activity Tolerance  Patient tolerated treatment well    Behavior During Therapy  WFL for tasks assessed/performed       Past Medical History:  Diagnosis Date  . Arthritis   . GERD (gastroesophageal reflux disease)   . Parkinson disease [facility])     Past Surgical History:  Procedure Laterality Date  . ABDOMINAL HYSTERECTOMY    . CHOLECYSTECTOMY    . DEEP BRAIN STIMULATOR PLACEMENT    . HEMORROIDECTOMY      There were no vitals filed for this visit.  Subjective Assessment - 11/22/17 1737    Subjective   Pt reports that she had a migrane today and is not feeling that great.     Patient is accompained by:  Family member    Pertinent History  Patient has had parkinsons disease for the last 20 years. Her mobility has been getting worse after she had a left radius fx.  She was walking with RW in 10/18.  Patient has worseing loss of balance beginning 10/18.  She had HHPT 2 x week for 2 months beginining after hospitilization in Dec 2018.     Patient Stated Goals  Patient wants to be able to walk better and not fall as often    Currently in Pain?  No/denies    Pain Score  0-No pain    Pain Onset  More than a month ago        Treatment:  Warm up onnu-step, level2x 66mns  Leg press 90 lbs x 20 x 2   Hip abd YTB   x 15 x 2  Hip ext  YTB   x 15 x 2  Heel  raises xx 15 x 2  Squats with 3 sec hold x 15 x 2  . Patient reports no discomfort following therapy.,-VCs for proper exercise form and technique for HEP and treatment.                        PT Education - 11/22/17 1739    Education Details  HEP    Person(s) Educated  Patient    Methods  Explanation    Comprehension  Verbalized understanding;Returned demonstration       PT Short Term Goals - 10/25/17 1317      PT SHORT TERM GOAL #1   Title  Patient will be independent in home exercise program to improve strength/mobility for better functional independence with ADLs.    Time  4    Period  Weeks    Status  Achieved    Target Date  10/25/17      PT SHORT TERM GOAL #2   Title  Patient will perofrm sit to stand with correct hand placement and without  support on the back of the chair with RW  and SBA.     Baseline  Patient needs min assist and needs min assist for correct hand placement.    Time  4    Period  Weeks    Status  Partially Met    Target Date  12/03/17      PT SHORT TERM GOAL #3   Title  Patient will stand for 1 minute with UE support to be able to perform safe transfers.     Baseline  Patient can stand for 10 seconds    Time  4    Period  Weeks    Status  On-going    Target Date  12/03/17        PT Long Term Goals - 10/25/17 1315      PT LONG TERM GOAL #1   Title  Patient will be able to ambulate in parallel bars x 10 feet with min assist.    Baseline  ; Patient is ambulating wiht rollator 25 feet and min assist    Time  12    Period  Weeks    Status  Achieved    Target Date  12/03/17      PT LONG TERM GOAL #2   Title  Patient will ambulate short distances ,25 feet, with RW and mod assist.     Baseline  : 08/22/17 patient is ambulating 15 feet with RW:09/12/17 25 feet with min assist and rollator, 10/25/17    Time  12    Period  Weeks    Status  Partially Met    Target Date  12/03/17      PT LONG TERM GOAL #3   Title  Patient  will be able to ascend 1 step with min assist with LARD.    Baseline  max assist with HHA    Time  12    Period  Weeks    Status  Partially Met    Target Date  12/03/17            Plan - 11/22/17 1741    Clinical Impression Statement  Pt requires direction and verbal cues for correct performance of exercises. Patient demonstrates weakness in BLE and performs open and closed chain exercises with no reports of pain. Pt was able to perform all exercises with mod assist and VC for technique. Patient struggles with control during movement as well as leg position with unstable surfaces.  Pt encouraged continuing HEP .Follow-up as scheduled.    Rehab Potential  Good    PT Frequency  2x / week    PT Duration  12 weeks    PT Treatment/Interventions  Therapeutic exercise;Therapeutic activities;Functional mobility training;Stair training;Balance training;Neuromuscular re-education;Patient/family education;Aquatic Therapy    PT Next Visit Plan  mobility training, strengthening, balanace training    PT Home Exercise Plan  LAQ, hip flex    Consulted and Agree with Plan of Care  Patient;Family member/caregiver       Patient will benefit from skilled therapeutic intervention in order to improve the following deficits and impairments:  Decreased balance, Decreased endurance, Decreased mobility, Decreased safety awareness, Decreased strength, Decreased activity tolerance  Visit Diagnosis: Muscle weakness (generalized)  Other lack of coordination  Difficulty in walking, not elsewhere classified  Parkinson's disease [facility])     Problem List Patient Active Problem List   Diagnosis Date Noted  . Pain and swelling of right lower extremity 01/02/2017  . Parkinson's disease ([place]) 01/02/2017  Alanson Puls, [place] DPT 11/22/2017, 5:42 PM  [place] MAIN [facility] SERVICES [address] [place], [place], [address] Phone: [phone]   Fax:   [phone]  Name: Linh Hedberg MRN: [id number] Date of Birth: [birth year]

## 2017-11-26 ENCOUNTER — Ambulatory Visit: Payer: Medicare Other | Admitting: Occupational Therapy

## 2017-11-26 ENCOUNTER — Encounter: Payer: Self-pay | Admitting: Occupational Therapy

## 2017-11-26 ENCOUNTER — Encounter: Payer: Self-pay | Admitting: Physical Therapy

## 2017-11-26 ENCOUNTER — Ambulatory Visit: Payer: Medicare Other | Admitting: Physical Therapy

## 2017-11-26 DIAGNOSIS — R262 Difficulty in walking, not elsewhere classified: Secondary | ICD-10-CM

## 2017-11-26 DIAGNOSIS — R278 Other lack of coordination: Secondary | ICD-10-CM

## 2017-11-26 DIAGNOSIS — G20 Parkinson's disease: Secondary | ICD-10-CM

## 2017-11-26 DIAGNOSIS — M6281 Muscle weakness (generalized): Secondary | ICD-10-CM | POA: Diagnosis not present

## 2017-11-26 NOTE — Therapy (Addendum)
[place] MAIN [facility] SERVICES [address] [place], [place], [address] Phone: [phone]   Fax:  [phone]  Occupational Therapy Treatment  Patient Details  Name: [place] Owens MRN: [id number] Date of Birth: [birth year] No data recorded  Encounter Date: 11/26/2017  OT End of Session - 11/26/17 1353    Visit Number  18    Number of Visits  24    Date for OT Re-Evaluation  02/04/18    Authorization Time Period  Visit 3 of 10 for progress report period starting 11/12/2017    OT Start Time  1300    OT Stop Time  1345    OT Time Calculation (min)  45 min    Activity Tolerance  Patient tolerated treatment well    Behavior During Therapy  [facility] for tasks assessed/performed       Past Medical History:  Diagnosis Date  . Arthritis   . GERD (gastroesophageal reflux disease)   . Parkinson disease [facility])     Past Surgical History:  Procedure Laterality Date  . ABDOMINAL HYSTERECTOMY    . CHOLECYSTECTOMY    . DEEP BRAIN STIMULATOR PLACEMENT    . HEMORROIDECTOMY      There were no vitals filed for this visit.  Subjective Assessment - 11/26/17 1351    Subjective   Pt reports she had a good weekend and that she is doing well today.    Pertinent History  Pt. is a 77 y.o. female with a history of Parkinsons Disease. Pt. has a Deep Brain Stimulator, and has recently had it adjusted. Pt. has had multiple falls, and has a history of multiple wrist fractures.     Patient Stated Goals  To be as independent as possible.     Currently in Pain?  No/denies    Pain Score  0-No pain       OT TREATMENT  Neuromuscular re-education:  Pt completed May tasks that required her to sort coins of various sizes into stacks of 10. When using the R hand, pt was able to store 10 pennies in her palm and translate them from her palm to the tip of her 1st, 2nd, and 3rd digits to prepare them for placing them on the table. Pt was unable to store pennies in her palm  while using her 2nd digit, and thumb with her left hand.  Pt dropped pennies when attempting to translate them to finger tips in the left hand. Pt attempted to complete grooved pegboard task. Pt. Attempted however was unable to place pegs into holes due to vision deficits despite visual cues. Pt was able to remove pegs alternating opposing digits 2 and 3 to thumb bilaterally. Pt. Worked on [facility] tasks that required her to remove and replace 1" velcro cubes using 1st, 2nd, and 3rd digits bilaterally. Pt was able to complete the task without cues.                  OT Education - 11/26/17 1352    Education Details  [facility] skills    Person(s) Educated  Patient    Methods  Explanation;Demonstration    Comprehension  Verbalized understanding;Returned demonstration          OT Long Term Goals - 11/12/17 1441      OT LONG TERM GOAL #1   Title  Pt. will improve BUE strength by 2 muscle grades to improve ADL, and IADL functioning.    Baseline  Pt. conitnues to present with  decreased UE strength    Time  12    Period  Weeks    Status  On-going    Target Date  02/04/18      OT LONG TERM GOAL #2   Title  Pt. will increase Left grip strength by 5# to be able to open jars, and containers.    Baseline  Limited grip strength    Time  12    Period  Weeks    Status  On-going    Target Date  02/04/18      OT LONG TERM GOAL #3   Title  Pt. will improve left hand [facility] skills  to be able to button clothing efficiently    Baseline  Pt. has difficulty manipulating buttons efficiently.    Time  12    Period  Weeks    Status  On-going    Target Date  02/04/18      OT LONG TERM GOAL #4   Title  Pt. will independently write one sentence efficiently with 75% legibility.    Baseline  One 10 work sentence with 25% legibility in 56 sec. with deviation from the line.    Time  12    Period  Weeks    Status  On-going    Target Date  02/04/18      OT LONG TERM GOAL #5   Title  Pt. independently  demonstrate work simplification strategies, and compensatory startegies for safe IADL tasks/home management tasks.       Baseline  Pt. continues to have difficulty    Time  12    Period  Weeks    Status  On-going    Target Date  02/04/18      OT LONG TERM GOAL #6   Title  Pt. will independently demonstrate work simplification strategies, and compensatory startegies within the kitchen from w/c height.    Baseline  Continue    Time  12    Period  Weeks    Status  On-going    Target Date  02/04/18      OT LONG TERM GOAL #7   Title  Pt. will be able to complete toilet hygiene care with supervision.    Baseline  Pt. is unable to perform the task.    Time  12    Period  Weeks    Status  New    Target Date  02/04/18            Plan - 11/26/17 1354    Clinical Impression Statement  Pt continues to make progress towards goals. Pt presents with limited [facility] skills. Pt continues to work to improve [facility] skills in order to increase functional independence during ADLs and IADLs such as managing clothing fasteners and opening containers.    Occupational Profile and client history currently impacting functional performance  Pt. resides with her husband who is a Theme park manager. Pt. has grown children.    Occupational performance deficits (Please refer to evaluation for details):  ADL's;IADL's    Current Impairments/barriers affecting progress:  Positive Barriers: age, motivation, family support. Negative Barriers: multiple comorbidities, history of multiple falls.    OT Frequency  2x / week    OT Duration  12 weeks    OT Treatment/Interventions  Self-care/ADL training;Neuromuscular education;Therapeutic exercise;DME and/or AE instruction;Visual/perceptual remediation/compensation;Patient/family education;Therapeutic activities    Clinical Decision Making  Several treatment options, min-mod task modification necessary    Consulted and Agree with Plan of Care  Patient  Patient will benefit from  skilled therapeutic intervention in order to improve the following deficits and impairments:  Abnormal gait, Decreased cognition, Pain, Decreased strength, Impaired UE functional use, Decreased activity tolerance, Decreased balance  Visit Diagnosis: Other lack of coordination    Problem List Patient Active Problem List   Diagnosis Date Noted  . Pain and swelling of right lower extremity 01/02/2017  . Parkinson's disease ([place]) 01/02/2017    Oliver Hum, OTS 11/26/2017, 1:58 PM   This entire session was performed under direct supervision and direction of a licensed therapist/therapist assistant . I have personally read, edited and approve of the note as written.  Harrel Carina, MS, OTR/L   [place] MAIN [facility] SERVICES [address] [place], [place], [address] Phone: [phone]   Fax:  [phone]  Name: [place] MRN: [id number] Date of Birth: 01/02/1941

## 2017-11-26 NOTE — Therapy (Signed)
[place] MAIN [facility] SERVICES [address] [place], [place], [address] Phone: [phone]   Fax:  [phone]  Physical Therapy Treatment  Patient Details  Name: [place] MRN: [id number] Date of Birth: [birth year] Referring Provider (PT): Jennelle Human   Encounter Date: 11/26/2017  PT End of Session - 11/26/17 1404    Visit Number  28    Number of Visits  33    Date for PT Re-Evaluation  12/03/17    Authorization Type  8/10    PT Start Time  0145    PT Stop Time  0230    PT Time Calculation (min)  45 min    Equipment Utilized During Treatment  Gait belt    Activity Tolerance  Patient tolerated treatment well    Behavior During Therapy  WFL for tasks assessed/performed       Past Medical History:  Diagnosis Date  . Arthritis   . GERD (gastroesophageal reflux disease)   . Parkinson disease [facility])     Past Surgical History:  Procedure Laterality Date  . ABDOMINAL HYSTERECTOMY    . CHOLECYSTECTOMY    . DEEP BRAIN STIMULATOR PLACEMENT    . HEMORROIDECTOMY      There were no vitals filed for this visit.    Treatment: Supine hooklying marching x 20 x 2 Supine hooklying hip abd/ER  With GTB x 20 x 2 sidelying hip abd x 10 x 2 BLE Bridging x 10 x 2 sets SLR x 10 x 2 BLE sidelying clams x 10 x 2 Seated LAQ x 10 x 2  Seated hip flex x 10 x 2   Sit to stand from various height mats to standing with rollator x 5 , VC for safety and sequencing   Gait training with rollator 25 feet and min assist with balance, upright position , and moving the rollator fwd; patient has posterior lean today.                          PT Education - 11/26/17 1403    Education Details  HEP    Person(s) Educated  Patient    Methods  Explanation;Demonstration;Tactile cues;Verbal cues    Comprehension  Verbalized understanding;Returned demonstration;Need further instruction       PT Short Term Goals - 10/25/17 1317       PT SHORT TERM GOAL #1   Title  Patient will be independent in home exercise program to improve strength/mobility for better functional independence with ADLs.    Time  4    Period  Weeks    Status  Achieved    Target Date  10/25/17      PT SHORT TERM GOAL #2   Title  Patient will perofrm sit to stand with correct hand placement and without support on the back of the chair with RW  and SBA.     Baseline  Patient needs min assist and needs min assist for correct hand placement.    Time  4    Period  Weeks    Status  Partially Met    Target Date  12/03/17      PT SHORT TERM GOAL #3   Title  Patient will stand for 1 minute with UE support to be able to perform safe transfers.     Baseline  Patient can stand for 10 seconds    Time  4    Period  Weeks  Status  On-going    Target Date  12/03/17        PT Long Term Goals - 11/26/17 1426      PT LONG TERM GOAL #1   Title  Patient will be able to ambulate in parallel bars x 10 feet with min assist.    Baseline  ; Patient is ambulating wiht rollator 25 feet and min assist    Time  12    Period  Weeks    Status  Achieved      PT LONG TERM GOAL #2   Title  Patient will ambulate short distances ,25 feet, with RW and mod assist.     Baseline  : 08/22/17 patient is ambulating 15 feet with RW:09/12/17 25 feet with min assist and rollator, 10/25/17, 12/03/17 patient ambulates 100 feet with min assist and rollator    Time  12    Period  Weeks    Status  Partially Met    Target Date  12/03/17      PT LONG TERM GOAL #3   Title  Patient will be able to ascend 1 step with min assist with LARD.    Baseline  max assist with HHA    Time  12    Period  Weeks    Status  On-going    Target Date  12/03/17              Patient will benefit from skilled therapeutic intervention in order to improve the following deficits and impairments:     Visit Diagnosis: Other lack of coordination  Muscle weakness (generalized)  Difficulty  in walking, not elsewhere classified  Parkinson's disease [facility])     Problem List Patient Active Problem List   Diagnosis Date Noted  . Pain and swelling of right lower extremity 01/02/2017  . Parkinson's disease ([place]) 01/02/2017    Alanson Puls , PT DPT 11/26/2017, 2:29 PM  [place] MAIN [facility] SERVICES [address] [place], [place], [address] Phone: [phone]   Fax:  [phone]  Name: [place] MRN: [id number] Date of Birth: 17-Sep-1940

## 2017-11-27 ENCOUNTER — Other Ambulatory Visit
Admission: RE | Admit: 2017-11-27 | Discharge: 2017-11-27 | Disposition: A | Discharge status: Home / Self Care | Payer: Medicare Other | Source: Ambulatory Visit | Attending: Student | Admitting: Student

## 2017-11-27 DIAGNOSIS — K625 Hemorrhage of anus and rectum: Secondary | ICD-10-CM | POA: Diagnosis present

## 2017-11-27 LAB — C DIFFICILE QUICK SCREEN W PCR REFLEX
C DIFFICILE (CDIFF) INTERP: NOT DETECTED
C DIFFICILE (CDIFF) TOXIN: NEGATIVE
C DIFFICLE (CDIFF) ANTIGEN: NEGATIVE

## 2017-11-27 LAB — GASTROINTESTINAL PANEL BY PCR, STOOL (REPLACES STOOL CULTURE)
ADENOVIRUS F40/41: NOT DETECTED
ASTROVIRUS: NOT DETECTED
CAMPYLOBACTER SPECIES: NOT DETECTED
CYCLOSPORA CAYETANENSIS: NOT DETECTED
Cryptosporidium: NOT DETECTED
ENTEROTOXIGENIC E COLI (ETEC): NOT DETECTED
Entamoeba histolytica: NOT DETECTED
Enteroaggregative E coli (EAEC): NOT DETECTED
Enteropathogenic E coli (EPEC): NOT DETECTED
Giardia lamblia: NOT DETECTED
NOROVIRUS GI/GII: NOT DETECTED
PLESIMONAS SHIGELLOIDES: NOT DETECTED
ROTAVIRUS A: NOT DETECTED
SAPOVIRUS (I, II, IV, AND V): NOT DETECTED
SHIGA LIKE TOXIN PRODUCING E COLI (STEC): NOT DETECTED
Salmonella species: NOT DETECTED
Shigella/Enteroinvasive E coli (EIEC): NOT DETECTED
VIBRIO SPECIES: NOT DETECTED
Vibrio cholerae: NOT DETECTED
Yersinia enterocolitica: NOT DETECTED

## 2017-11-28 ENCOUNTER — Encounter: Payer: Medicare Other | Admitting: Occupational Therapy

## 2017-11-28 ENCOUNTER — Ambulatory Visit: Payer: Medicare Other | Admitting: Physical Therapy

## 2017-11-28 LAB — CALPROTECTIN, FECAL: Calprotectin, Fecal: 174 ug/g — ABNORMAL HIGH (ref 0–120)

## 2017-12-03 ENCOUNTER — Ambulatory Visit: Payer: Medicare Other

## 2017-12-03 ENCOUNTER — Encounter: Payer: Medicare Other | Admitting: Occupational Therapy

## 2017-12-05 ENCOUNTER — Ambulatory Visit: Payer: Medicare Other | Admitting: Occupational Therapy

## 2017-12-05 ENCOUNTER — Ambulatory Visit: Payer: Medicare Other | Admitting: Physical Therapy

## 2017-12-10 ENCOUNTER — Ambulatory Visit: Payer: Medicare Other | Admitting: Physical Therapy

## 2017-12-10 ENCOUNTER — Ambulatory Visit: Payer: Medicare Other | Admitting: Occupational Therapy

## 2017-12-12 ENCOUNTER — Ambulatory Visit: Payer: Medicare Other | Admitting: Physical Therapy

## 2017-12-12 ENCOUNTER — Ambulatory Visit: Payer: Medicare Other | Admitting: Occupational Therapy

## 2017-12-18 ENCOUNTER — Ambulatory Visit: Payer: Medicare Other | Admitting: Physical Therapy

## 2017-12-18 ENCOUNTER — Ambulatory Visit: Payer: Medicare Other | Admitting: Occupational Therapy

## 2017-12-18 ENCOUNTER — Encounter: Payer: Self-pay | Admitting: Student

## 2017-12-19 ENCOUNTER — Encounter: Payer: Self-pay | Admitting: *Deleted

## 2017-12-19 ENCOUNTER — Ambulatory Visit
Admission: RE | Admit: 2017-12-19 | Discharge: 2017-12-19 | Disposition: A | Discharge status: Home / Self Care | Payer: Medicare Other | Source: Ambulatory Visit | Attending: Unknown Physician Specialty | Admitting: Unknown Physician Specialty

## 2017-12-19 ENCOUNTER — Encounter
Admission: RE | Disposition: A | Discharge status: Home / Self Care | Payer: Self-pay | Source: Ambulatory Visit | Attending: Unknown Physician Specialty

## 2017-12-19 DIAGNOSIS — I10 Essential (primary) hypertension: Secondary | ICD-10-CM | POA: Diagnosis not present

## 2017-12-19 DIAGNOSIS — E785 Hyperlipidemia, unspecified: Secondary | ICD-10-CM | POA: Insufficient documentation

## 2017-12-19 DIAGNOSIS — I2510 Atherosclerotic heart disease of native coronary artery without angina pectoris: Secondary | ICD-10-CM | POA: Diagnosis not present

## 2017-12-19 DIAGNOSIS — Z880 Allergy status to penicillin: Secondary | ICD-10-CM | POA: Insufficient documentation

## 2017-12-19 DIAGNOSIS — K219 Gastro-esophageal reflux disease without esophagitis: Secondary | ICD-10-CM | POA: Diagnosis not present

## 2017-12-19 DIAGNOSIS — G20 Parkinson's disease: Secondary | ICD-10-CM | POA: Insufficient documentation

## 2017-12-19 DIAGNOSIS — Z7952 Long term (current) use of systemic steroids: Secondary | ICD-10-CM | POA: Diagnosis not present

## 2017-12-19 DIAGNOSIS — K529 Noninfective gastroenteritis and colitis, unspecified: Secondary | ICD-10-CM | POA: Insufficient documentation

## 2017-12-19 DIAGNOSIS — I471 Supraventricular tachycardia: Secondary | ICD-10-CM | POA: Insufficient documentation

## 2017-12-19 DIAGNOSIS — M1990 Unspecified osteoarthritis, unspecified site: Secondary | ICD-10-CM | POA: Diagnosis not present

## 2017-12-19 HISTORY — DX: Essential (primary) hypertension: I10

## 2017-12-19 HISTORY — DX: Intercostal pain: R07.82

## 2017-12-19 HISTORY — PX: FLEXIBLE SIGMOIDOSCOPY: SHX5431

## 2017-12-19 HISTORY — DX: Supraventricular tachycardia, unspecified: I47.10

## 2017-12-19 HISTORY — DX: Atherosclerotic heart disease of native coronary artery without angina pectoris: I25.10

## 2017-12-19 HISTORY — DX: Supraventricular tachycardia: I47.1

## 2017-12-19 HISTORY — DX: Hyperlipidemia, unspecified: E78.5

## 2017-12-19 HISTORY — DX: Bursopathy, unspecified: M71.9

## 2017-12-19 HISTORY — DX: Other voice and resonance disorders: R49.8

## 2017-12-19 SURGERY — SIGMOIDOSCOPY, FLEXIBLE
Anesthesia: General

## 2017-12-19 MED ORDER — SODIUM CHLORIDE 0.9 % IV SOLN
INTRAVENOUS | Status: DC
  Administered 2017-12-19: 1000 mL via INTRAVENOUS

## 2017-12-19 MED ORDER — SODIUM CHLORIDE 0.9 % IV SOLN: INTRAVENOUS | Status: DC

## 2017-12-19 MED ORDER — LIDOCAINE HCL URETHRAL/MUCOSAL 2 % EX GEL
CUTANEOUS | Status: AC
  Filled 2017-12-19: qty 5

## 2017-12-19 MED ORDER — MIDAZOLAM HCL 5 MG/5ML IJ SOLN
INTRAMUSCULAR | Status: AC
  Filled 2017-12-19: qty 5

## 2017-12-19 NOTE — Op Note (Signed)
[facility] Patient Name: [place] Procedure Date: 12/19/2017 1:29 PM MRN: [id number] Account #: [id number] Date of Birth: [birth year] Admit Type: Outpatient Age: 77 Room: [facility] ENDO ROOM 2 Gender: Female Note Status: Finalized Procedure:            Flexible Sigmoidoscopy Indications:          Hematochezia Providers:            Manya Silvas, MD Referring MD:         Leona Carry. Hall Busing, MD (Referring MD) Medicines:            xylocaine jelly ointment with the KY jelly. Complications:        No immediate complications. Procedure:            Pre-Anesthesia Assessment:                       - After reviewing the risks and benefits, the patient                        was deemed in satisfactory condition to undergo the                        procedure.                       After obtaining informed consent, the scope was passed                        under direct vision. The Endoscope was introduced                        through the anus and advanced to the the sigmoid colon.                        The flexible sigmoidoscopy was accomplished without                        difficulty. The patient tolerated the procedure well.                        The quality of the bowel preparation was good. Findings:      Inflammation of sigmoid and rectum with some areas of blood and other       areas of grannular mucosa.      both areas had inflammation diffusely.      biopsies done twice with the small forceps.      The perianal and digital rectal examinations were normal. Impression:           - No specimens collected. Recommendation:       - Await pathology results.                       resume prednisone. Manya Silvas, MD 12/19/2017 1:57:17 PM This report has been signed electronically. Number of Addenda: 0 Note Initiated On: 12/19/2017 1:29 PM Total Procedure Duration: 0 hours 3 minutes 18 seconds       [facility]

## 2017-12-19 NOTE — H&P (Signed)
Primary Care Physician:  Albina Billet, MD Primary Gastroenterologist:  Dr. Vira Agar  Pre-Procedure History & Physical: HPI:  Kelsey Owens is a 77 y.o. female is here for an flexible sigmoidoscopy.  No prior similar procedure.   Past Medical History:  Diagnosis Date  . Arthritis   . Bursitis   . CAD (coronary artery disease)   . GERD (gastroesophageal reflux disease)   . Hyperlipidemia   . Hypertension   . Hypophonia   . Intercostal pain   . Parkinson disease ([place])   . Paroxysmal supraventricular tachycardia [facility])     Past Surgical History:  Procedure Laterality Date  . ABDOMINAL HYSTERECTOMY    . CHOLECYSTECTOMY    . DEEP BRAIN STIMULATOR PLACEMENT    . HEMORROIDECTOMY      Prior to Admission medications   Medication Sig Start Date End Date Taking? Authorizing Provider  almotriptan (AXERT) 12.5 MG tablet Take by mouth.   Yes [provider]  baclofen (LIORESAL) 10 MG tablet baclofen 10 mg tablet   Yes [provider]  Biotin 2.5 MG TABS Take by mouth.   Yes [provider]  carbidopa-levodopa (SINEMET IR) 25-100 MG tablet 0.5 tablets 6 (six) times daily.   Yes [provider]  cycloSPORINE (RESTASIS) 0.05 % ophthalmic emulsion 1 drop 2 (two) times daily.   Yes [provider]  Lifitegrast (XIIDRA) 5 % SOLN xiidra 5 % soln   Yes [provider]  medroxyPROGESTERone (PROVERA) 2.5 MG tablet medroxyprogesterone acetate 2.5 mgtabs   Yes [provider]  meloxicam (MOBIC) 15 MG tablet meloxicam 15 mg tabs   Yes [provider]  Mesalamine (DELZICOL) 400 MG CPDR DR capsule 2 (two) times daily.   Yes [provider]  mirabegron ER (MYRBETRIQ) 25 MG TB24 tablet myrbetriq 25 mg tb24   Yes [provider]  rOPINIRole (REQUIP) 1 MG tablet Take by mouth. 06/19/16  Yes [provider]  selegiline (ELDEPRYL) 5 MG capsule selegiline 5 mg capsule 04/05/16  Yes [provider]   sertraline (ZOLOFT) 25 MG tablet sertraline 25 mg tablet 02/04/16  Yes [provider]  sulfamethoxazole-trimethoprim (BACTRIM DS,SEPTRA DS) 800-160 MG tablet Take 1 tablet by mouth 2 (two) times daily. 10/18/17  Yes Eula Listen, MD  traMADol (ULTRAM) 50 MG tablet tramadol 50 mg tablet   Yes [provider]  trospium (SANCTURA) 20 MG tablet Take 20 mg by mouth 2 (two) times daily.   Yes [provider]  Vitamin D, Ergocalciferol, (DRISDOL) 50000 units CAPS capsule vitamin d 50000 unit caps   Yes [provider]  clonazePAM (KLONOPIN) 0.5 MG tablet Take by mouth. 12/14/16 06/12/17  [provider]  HYDROcodone-acetaminophen (NORCO) 5-325 MG per tablet Take 1 tablet by mouth every 6 (six) hours as needed for moderate pain or severe pain. Patient not taking: Reported on 01/02/2017 06/26/14   Carlena Hurl, PA-C  nitroGLYCERIN (NITROSTAT) 0.3 MG SL tablet Place 0.3 mg under the tongue every 5 (five) minutes as needed for chest pain.    [provider]  ondansetron (ZOFRAN ODT) 4 MG disintegrating tablet Take 1 tablet (4 mg total) by mouth every 8 (eight) hours as needed for nausea or vomiting. 01/21/15   Harvest Dark, MD  predniSONE (DELTASONE) 10 MG tablet Take 10 mg by mouth daily with breakfast.    [provider]  ranitidine (ZANTAC) 300 MG tablet ranitidine hcl 300 mg tabs    [provider]  simethicone (GAS-X)  80 MG chewable tablet Chew 1 tablet (80 mg total) by mouth every 6 (six) hours as needed for flatulence. 07/06/16 07/06/17  Merlyn Lot, MD    Allergies as of 12/18/2017 - Review Complete 12/18/2017  Allergen Reaction Noted  . Carafate [sucralfate]  12/18/2017  . Levaquin [levofloxacin in d5w]  10/10/2015  . Penicillins  10/10/2015  . Septra [sulfamethoxazole-trimethoprim]  12/18/2017  . Thorazine [chlorpromazine] Other (See Comments) 06/26/2014  . Ceftin [cefuroxime] Rash 06/26/2014     Family History  Problem Relation Age of Onset  . Breast cancer Maternal Aunt   . Breast cancer Paternal Grandmother     Social History   Socioeconomic History  . Marital status: Married    Spouse name: Not on file  . Number of children: Not on file  . Years of education: Not on file  . Highest education level: Not on file  Occupational History  . Not on file  Social Needs  . Financial resource strain: Not on file  . Food insecurity:    Worry: Not on file    Inability: Not on file  . Transportation needs:    Medical: Not on file    Non-medical: Not on file  Tobacco Use  . Smoking status: Never Smoker  . Smokeless tobacco: Never Used  Substance and Sexual Activity  . Alcohol use: Yes    Comment: occasional  . Drug use: No  . Sexual activity: Not on file  Lifestyle  . Physical activity:    Days per week: Not on file    Minutes per session: Not on file  . Stress: Not on file  Relationships  . Social connections:    Talks on phone: Not on file    Gets together: Not on file    Attends religious service: Not on file    Active member of club or organization: Not on file    Attends meetings of clubs or organizations: Not on file    Relationship status: Not on file  . Intimate partner violence:    Fear of current or ex partner: Not on file    Emotionally abused: Not on file    Physically abused: Not on file    Forced sexual activity: Not on file  Other Topics Concern  . Not on file  Social History Narrative  . Not on file    Review of Systems: See HPI, otherwise negative ROS  Physical Exam: BP (!) 164/77   Pulse 88   Temp (!) 97 F (36.1 C) (Tympanic)   Resp 20   Ht 5' 4"  (1.626 m)   Wt 65.3 kg   SpO2 100%   BMI 24.72 kg/m  General:   Alert,  pleasant and cooperative in NAD Head:  Normocephalic and atraumatic. Neck:  Supple; no masses or thyromegaly. Lungs:  Clear throughout to auscultation.    Heart:  Regular rate and rhythm. Abdomen:  Soft,  nontender and nondistended. Normal bowel sounds, without guarding, and without rebound.   Neurologic:  Alert and  oriented x4;  grossly normal neurologically.  Impression/Plan: Kelsey Owens is here for an flexible sigmoidoscopy to be performed for rectal bleeding.  Risks, benefits, limitations, and alternatives regarding  flexible sigmoidoscopy have been reviewed with the patient.  Questions have been answered.  All parties agreeable.   Gaylyn Cheers, MD  12/19/2017, 1:33 PM

## 2017-12-20 ENCOUNTER — Encounter: Payer: Self-pay | Admitting: Unknown Physician Specialty

## 2017-12-20 ENCOUNTER — Ambulatory Visit: Payer: Medicare Other | Attending: Family Medicine | Admitting: Physical Therapy

## 2017-12-20 DIAGNOSIS — G20 Parkinson's disease: Secondary | ICD-10-CM | POA: Insufficient documentation

## 2017-12-20 DIAGNOSIS — R278 Other lack of coordination: Secondary | ICD-10-CM | POA: Insufficient documentation

## 2017-12-20 DIAGNOSIS — R262 Difficulty in walking, not elsewhere classified: Secondary | ICD-10-CM | POA: Insufficient documentation

## 2017-12-20 DIAGNOSIS — M79604 Pain in right leg: Secondary | ICD-10-CM | POA: Insufficient documentation

## 2017-12-20 DIAGNOSIS — M7989 Other specified soft tissue disorders: Secondary | ICD-10-CM | POA: Insufficient documentation

## 2017-12-20 DIAGNOSIS — M6281 Muscle weakness (generalized): Secondary | ICD-10-CM | POA: Insufficient documentation

## 2017-12-21 LAB — SURGICAL PATHOLOGY

## 2017-12-25 ENCOUNTER — Ambulatory Visit: Payer: Medicare Other | Admitting: Physical Therapy

## 2017-12-25 ENCOUNTER — Ambulatory Visit: Payer: Medicare Other | Admitting: Occupational Therapy

## 2017-12-27 ENCOUNTER — Ambulatory Visit: Payer: Medicare Other | Admitting: Physical Therapy

## 2018-01-01 ENCOUNTER — Encounter: Payer: Medicare Other | Admitting: Occupational Therapy

## 2018-01-01 ENCOUNTER — Ambulatory Visit: Payer: Medicare Other | Admitting: Physical Therapy

## 2018-01-03 ENCOUNTER — Encounter: Payer: Self-pay | Admitting: Physical Therapy

## 2018-01-03 ENCOUNTER — Encounter: Payer: Self-pay | Admitting: Occupational Therapy

## 2018-01-03 ENCOUNTER — Ambulatory Visit: Payer: Medicare Other | Admitting: Physical Therapy

## 2018-01-03 ENCOUNTER — Ambulatory Visit: Payer: Medicare Other | Admitting: Occupational Therapy

## 2018-01-03 DIAGNOSIS — M6281 Muscle weakness (generalized): Secondary | ICD-10-CM

## 2018-01-03 DIAGNOSIS — M79604 Pain in right leg: Secondary | ICD-10-CM | POA: Diagnosis present

## 2018-01-03 DIAGNOSIS — R278 Other lack of coordination: Secondary | ICD-10-CM | POA: Diagnosis present

## 2018-01-03 DIAGNOSIS — R262 Difficulty in walking, not elsewhere classified: Secondary | ICD-10-CM

## 2018-01-03 DIAGNOSIS — M7989 Other specified soft tissue disorders: Secondary | ICD-10-CM | POA: Diagnosis present

## 2018-01-03 DIAGNOSIS — G20 Parkinson's disease: Secondary | ICD-10-CM | POA: Diagnosis present

## 2018-01-03 NOTE — Therapy (Signed)
[place] MAIN [facility] SERVICES [address] [place], [place], [address] Phone: [phone]   Fax:  [phone]  Physical Therapy Treatment  Patient Details  Name: Kelsey Owens MRN: [id number] Date of Birth: 08-19-1940 Referring Provider (PT): Jennelle Human   Encounter Date: 01/03/2018  PT End of Session - 01/03/18 1549    Visit Number  29    Number of Visits  33    Date for PT Re-Evaluation  02/28/18   Authorization Type  9/10    PT Start Time  0320    PT Stop Time  0400    PT Time Calculation (min)  40 min    Equipment Utilized During Treatment  Gait belt    Activity Tolerance  Patient tolerated treatment well    Behavior During Therapy  WFL for tasks assessed/performed       Past Medical History:  Diagnosis Date  . Arthritis   . Bursitis   . CAD (coronary artery disease)   . GERD (gastroesophageal reflux disease)   . Hyperlipidemia   . Hypertension   . Hypophonia   . Intercostal pain   . Parkinson disease ([place])   . Paroxysmal supraventricular tachycardia [facility])     Past Surgical History:  Procedure Laterality Date  . ABDOMINAL HYSTERECTOMY    . CHOLECYSTECTOMY    . DEEP BRAIN STIMULATOR PLACEMENT    . FLEXIBLE SIGMOIDOSCOPY N/A 12/19/2017   Procedure: FLEXIBLE SIGMOIDOSCOPY;  Surgeon: Manya Silvas, MD;  Location: [facility] ENDOSCOPY;  Service: Endoscopy;  Laterality: N/A;  . HEMORROIDECTOMY      There were no vitals filed for this visit.  Subjective Assessment - 01/03/18 1547    Subjective   Pt reports that she is feeling better and had bleeding following a precedure.      Patient is accompained by:  Family member    Pertinent History  Patient has had parkinsons disease for the last 20 years. Her mobility has been getting worse after she had a left radius fx.  She was walking with RW in 10/18.  Patient has worseing loss of balance beginning 10/18.  She had HHPT 2 x week for 2 months beginining after hospitilization in  Dec 2018.     Patient Stated Goals  Patient wants to be able to walk better and not fall as often    Currently in Pain?  No/denies    Pain Score  0-No pain    Pain Onset  More than a month ago    Multiple Pain Sites  No      Therapeutic activities: Patient needs mod assist for transfers sit to stand with vc for sequencing and tactile cues for hand placement Patient ambulates with rollator and mod assist for assist to push the rollator and mod assist for posterior loss of balance Patient performs 5 times sit to stand with mod assist with UE support Patient performs seated LAQ with 2 lbs x 10 x 2 Patient performs seated hip abd/ER with YTB x 20  Patient is instructed in HEP for sit to stand                          PT Education - 01/03/18 1548    Education Details  plan of care and goals    Person(s) Educated  Patient    Methods  Explanation    Comprehension  Verbalized understanding       PT Short Term Goals -  10/25/17 1317      PT SHORT TERM GOAL #1   Title  Patient will be independent in home exercise program to improve strength/mobility for better functional independence with ADLs.    Time  4    Period  Weeks    Status  Achieved    Target Date  10/25/17      PT SHORT TERM GOAL #2   Title  Patient will perofrm sit to stand with correct hand placement and without support on the back of the chair with RW  and SBA.     Baseline  Patient needs min assist and needs min assist for correct hand placement.    Time  4    Period  Weeks    Status  Partially Met    Target Date  12/03/17      PT SHORT TERM GOAL #3   Title  Patient will stand for 1 minute with UE support to be able to perform safe transfers.     Baseline  Patient can stand for 10 seconds    Time  4    Period  Weeks    Status  On-going    Target Date  12/03/17        PT Long Term Goals - 01/03/18 1533      PT LONG TERM GOAL #1   Title  Patient will be able to ambulate in parallel bars x  10 feet with min assist.    Baseline  ; Patient is ambulating wiht rollator 25 feet and min assist,     Time  12    Period  Weeks    Status  Achieved    Target Date  02/28/18      PT LONG TERM GOAL #2   Title  Patient will ambulate short distances ,25 feet, with RW and mod assist.     Baseline  : 08/22/17 patient is ambulating 15 feet with RW:09/12/17 25 feet with min assist and rollator, 10/25/17, 12/03/17 patient ambulates 100 feet with min assist and rollator, 01/03/18= mod assist for 100 feet with rollator    Time  12    Period  Weeks    Status  Partially Met    Target Date  02/28/18      PT LONG TERM GOAL #3   Title  Patient will be able to ascend 1 step with min assist with LARD.    Baseline  01/03/18=max assist with HHA    Time  12    Period  Weeks    Status  On-going    Target Date  02/28/18      PT LONG TERM GOAL #4   Title  Patient (> 56 years old) will complete five times sit to stand test in < 15 seconds indicating an increased LE strength and improved balance.    Baseline  01/03/18= 57.45 sec with mod assist     Time  8    Period  Weeks    Status  New    Target Date  02/28/18      PT LONG TERM GOAL #5   Title  Patient will be able to perform sit to stand transfer with sBA from [facility]     Baseline  01/03/18 needs mod assist    Time  8    Period  Weeks    Status  New    Target Date  02/28/17            Plan - 01/03/18  1550    Clinical Impression Statement  Patient continues to have weakness in BLE and decreased mobility with transfers and ambulation with rW. She needs mod assist for sit to stand and mod assist for gait with rollator. She has new goals set for mobility and saftey and will continue to benefit from skilled PT to improve mobiltiy and saftey.     Rehab Potential  Good    PT Frequency  2x / week    PT Duration  12 weeks    PT Treatment/Interventions  Therapeutic exercise;Therapeutic activities;Functional mobility training;Stair training;Balance  training;Neuromuscular re-education;Patient/family education;Aquatic Therapy    PT Next Visit Plan  mobility training, strengthening, balanace training    PT Home Exercise Plan  LAQ, hip flex    Consulted and Agree with Plan of Care  Patient;Family member/caregiver       Patient will benefit from skilled therapeutic intervention in order to improve the following deficits and impairments:  Decreased balance, Decreased endurance, Decreased mobility, Decreased safety awareness, Decreased strength, Decreased activity tolerance  Visit Diagnosis: Other lack of coordination  Muscle weakness (generalized)  Difficulty in walking, not elsewhere classified     Problem List Patient Active Problem List   Diagnosis Date Noted  . Pain and swelling of right lower extremity 01/02/2017  . Parkinson's disease ([place]) 01/02/2017    Alanson Puls, PT DPT 01/03/2018, 4:06 PM  [place] MAIN [facility] SERVICES [address] [place], [place], [address] Phone: [phone]   Fax:  [phone]  Name: Kelsey Owens MRN: [id number] Date of Birth: 03-22-1940

## 2018-01-03 NOTE — Therapy (Addendum)
[place] MAIN [facility] SERVICES [address] [place], [place], [address] Phone: [phone]   Fax:  [phone]  Occupational Therapy Treatment  Patient Details  Name: Kelsey Owens MRN: [id number] Date of Birth: December 23, 1940 No data recorded  Encounter Date: 01/03/2018  OT End of Session - 01/03/18 1452    Visit Number  19    Number of Visits  24    Date for OT Re-Evaluation  02/04/18    Authorization Time Period  Visit 4 of 10 for progress report period starting 11/12/2017    OT Start Time  1440    OT Stop Time  1515    OT Time Calculation (min)  35 min    Activity Tolerance  Patient tolerated treatment well    Behavior During Therapy  [facility] for tasks assessed/performed       Past Medical History:  Diagnosis Date  . Arthritis   . Bursitis   . CAD (coronary artery disease)   . GERD (gastroesophageal reflux disease)   . Hyperlipidemia   . Hypertension   . Hypophonia   . Intercostal pain   . Parkinson disease ([place])   . Paroxysmal supraventricular tachycardia [facility])     Past Surgical History:  Procedure Laterality Date  . ABDOMINAL HYSTERECTOMY    . CHOLECYSTECTOMY    . DEEP BRAIN STIMULATOR PLACEMENT    . FLEXIBLE SIGMOIDOSCOPY N/A 12/19/2017   Procedure: FLEXIBLE SIGMOIDOSCOPY;  Surgeon: Manya Silvas, MD;  Location: [facility] ENDOSCOPY;  Service: Endoscopy;  Laterality: N/A;  . HEMORROIDECTOMY      There were no vitals filed for this visit.  Subjective Assessment - 01/03/18 1445    Subjective   Pt reports that she is glad to be back in therapy.    Patient is accompained by:  Family member    Pertinent History  Pt. is a 77 y.o. female with a history of Parkinsons Disease. Pt. has a Deep Brain Stimulator, and has recently had it adjusted. Pt. has had multiple falls, and has a history of multiple wrist fractures.     Patient Stated Goals  To be as independent as possible.     Currently in Pain?  No/denies      OT  TREATMENT  Self-care:   Pt. worked on Media planner and sizing. Pt completed pre-writing exercises to prepare her hand for writing tasks. Pt. was able to write name x5 in cursive, x5 in print, and copy from a printed sample. Pt presents with micrographic writing. Pt worked on Estate agent on ruled paper to increase writing size. Pt.'s writing size decreases with prolonged writing. Pt's demonstrates ~75% legibility for print and cursive forms. Pt. used a mature hand grasp on the pen. Pt. was able to hold the pen for the entire exercise.                  OT Education - 01/03/18 1446    Education Details  Handwriting skills    Person(s) Educated  Patient    Methods  Explanation;Demonstration    Comprehension  Verbalized understanding;Returned demonstration          OT Long Term Goals - 11/12/17 1441      OT LONG TERM GOAL #1   Title  Pt. will improve BUE strength by 2 muscle grades to improve ADL, and IADL functioning.    Baseline  Pt. conitnues to present with decreased UE strength    Time  12    Period  Weeks  Status  On-going    Target Date  02/04/18      OT LONG TERM GOAL #2   Title  Pt. will increase Left grip strength by 5# to be able to open jars, and containers.    Baseline  Limited grip strength    Time  12    Period  Weeks    Status  On-going    Target Date  02/04/18      OT LONG TERM GOAL #3   Title  Pt. will improve left hand [facility] skills  to be able to button clothing efficiently    Baseline  Pt. has difficulty manipulating buttons efficiently.    Time  12    Period  Weeks    Status  On-going    Target Date  02/04/18      OT LONG TERM GOAL #4   Title  Pt. will independently write one sentence efficiently with 75% legibility.    Baseline  One 10 work sentence with 25% legibility in 56 sec. with deviation from the line.    Time  12    Period  Weeks    Status  On-going    Target Date  02/04/18      OT LONG TERM GOAL #5   Title  Pt.  independently demonstrate work simplification strategies, and compensatory startegies for safe IADL tasks/home management tasks.       Baseline  Pt. continues to have difficulty    Time  12    Period  Weeks    Status  On-going    Target Date  02/04/18      OT LONG TERM GOAL #6   Title  Pt. will independently demonstrate work simplification strategies, and compensatory startegies within the kitchen from w/c height.    Baseline  Continue    Time  12    Period  Weeks    Status  On-going    Target Date  02/04/18      OT LONG TERM GOAL #7   Title  Pt. will be able to complete toilet hygiene care with supervision.    Baseline  Pt. is unable to perform the task.    Time  12    Period  Weeks    Status  New    Target Date  02/04/18            Plan - 01/03/18 1452    Clinical Impression Statement Pt worked on Engineer, production with a focus on increasing the size of her writing as pt. presents with micrographia. Pt's legibility tends to decrease as the size of her writing decreasing. Pt to continue to work to improve writing legibility, speed, and size to promote independence during IADLs such as signing checks, signing in for therapy, and writing grocery lists.     Occupational Profile and client history currently impacting functional performance  Pt. resides with her husband who is a Theme park manager. Pt. has grown children.    Occupational performance deficits (Please refer to evaluation for details):  ADL's;IADL's    Current Impairments/barriers affecting progress:  Positive Barriers: age, motivation, family support. Negative Barriers: multiple comorbidities, history of multiple falls.    OT Frequency  2x / week    OT Duration  12 weeks    OT Treatment/Interventions  Self-care/ADL training;Neuromuscular education;Therapeutic exercise;DME and/or AE instruction;Visual/perceptual remediation/compensation;Patient/family education;Therapeutic activities    Clinical Decision Making  Several treatment  options, min-mod task modification necessary    Consulted and Agree with Plan of  Care  Patient       Patient will benefit from skilled therapeutic intervention in order to improve the following deficits and impairments:  Abnormal gait, Decreased cognition, Pain, Decreased strength, Impaired UE functional use, Decreased activity tolerance, Decreased balance  Visit Diagnosis: Other lack of coordination  Muscle weakness (generalized)    Problem List Patient Active Problem List   Diagnosis Date Noted  . Pain and swelling of right lower extremity 01/02/2017  . Parkinson's disease ([place]) 01/02/2017    Oliver Hum, OTS 01/03/2018, 5:11 PM   This entire session was performed under direct supervision and direction of a licensed therapist/therapist assistant . I have personally read, edited and approve of the note as written.  Harrel Carina, MS, OTR/L   [place] MAIN [facility] SERVICES [address] [place], [place], [address] Phone: [phone]   Fax:  [phone]  Name: Kelsey Owens MRN: [id number] Date of Birth: 02-02-1941

## 2018-01-07 ENCOUNTER — Encounter: Payer: Self-pay | Admitting: Occupational Therapy

## 2018-01-07 ENCOUNTER — Encounter: Payer: Self-pay | Admitting: Physical Therapy

## 2018-01-07 ENCOUNTER — Ambulatory Visit: Payer: Medicare Other | Admitting: Occupational Therapy

## 2018-01-07 ENCOUNTER — Ambulatory Visit: Payer: Medicare Other | Admitting: Physical Therapy

## 2018-01-07 DIAGNOSIS — R278 Other lack of coordination: Secondary | ICD-10-CM

## 2018-01-07 DIAGNOSIS — M6281 Muscle weakness (generalized): Secondary | ICD-10-CM

## 2018-01-07 DIAGNOSIS — M79604 Pain in right leg: Secondary | ICD-10-CM

## 2018-01-07 DIAGNOSIS — M7989 Other specified soft tissue disorders: Secondary | ICD-10-CM

## 2018-01-07 DIAGNOSIS — G20 Parkinson's disease: Secondary | ICD-10-CM

## 2018-01-07 DIAGNOSIS — R262 Difficulty in walking, not elsewhere classified: Secondary | ICD-10-CM

## 2018-01-07 NOTE — Therapy (Addendum)
[place] MAIN [facility] SERVICES [address] [place], [place], [address] Phone: [phone]   Fax:  [phone]  Occupational Therapy Treatment  Patient Details  Name: Kelsey Owens MRN: [id number] Date of Birth: 08/06/1940 No data recorded  Encounter Date: 01/07/2018  OT End of Session - 01/07/18 1439    Visit Number  20    Number of Visits  24    Date for OT Re-Evaluation  02/04/18    Authorization Time Period  Visit 5 of 10 for progress report period starting 11/12/2017    OT Start Time  1430    OT Stop Time  1515    OT Time Calculation (min)  45 min    Activity Tolerance  Patient tolerated treatment well    Behavior During Therapy  [facility] for tasks assessed/performed       Past Medical History:  Diagnosis Date  . Arthritis   . Bursitis   . CAD (coronary artery disease)   . GERD (gastroesophageal reflux disease)   . Hyperlipidemia   . Hypertension   . Hypophonia   . Intercostal pain   . Parkinson disease ([place])   . Paroxysmal supraventricular tachycardia [facility])     Past Surgical History:  Procedure Laterality Date  . ABDOMINAL HYSTERECTOMY    . CHOLECYSTECTOMY    . DEEP BRAIN STIMULATOR PLACEMENT    . FLEXIBLE SIGMOIDOSCOPY N/A 12/19/2017   Procedure: FLEXIBLE SIGMOIDOSCOPY;  Surgeon: Manya Silvas, MD;  Location: [facility] ENDOSCOPY;  Service: Endoscopy;  Laterality: N/A;  . HEMORROIDECTOMY      There were no vitals filed for this visit.  Subjective Assessment - 01/07/18 1437    Subjective   Pt reports she is doing well and that she is recovering well.     Patient is accompained by:  Family member    Pertinent History  Pt. is a 77 y.o. female with a history of Parkinsons Disease. Pt. has a Deep Brain Stimulator, and has recently had it adjusted. Pt. has had multiple falls, and has a history of multiple wrist fractures.     Patient Stated Goals  To be as independent as possible.     Currently in Pain?  No/denies    Pain  Score  0-No pain      OT TREATMENT  Self-care:  Pt. worked on Media planner and size using her right hand. Pt. was able to write four sentences of various complexities in cursive and in print. Pt. used a mature hand grasp on the pen and wrote on unlined paper. Pt. was able to hold the pen for the entire exercise. Pt required increased verbal cuing to write larger as she presents with micrographia. Pt demonstrates ~75% legibility when writing in print and ~50% legibility when writing in cursive. Pt was not able to maintain consistent line spacing nor margin spacing today.     OT Education - 01/07/18 1439    Education Details  right hand [facility] skills    Person(s) Educated  Patient    Methods  Explanation;Demonstration    Comprehension  Verbalized understanding;Returned demonstration          OT Long Term Goals - 11/12/17 1441      OT LONG TERM GOAL #1   Title  Pt. will improve BUE strength by 2 muscle grades to improve ADL, and IADL functioning.    Baseline  Pt. conitnues to present with decreased UE strength    Time  12    Period  Weeks    Status  On-going    Target Date  02/04/18      OT LONG TERM GOAL #2   Title  Pt. will increase Left grip strength by 5# to be able to open jars, and containers.    Baseline  Limited grip strength    Time  12    Period  Weeks    Status  On-going    Target Date  02/04/18      OT LONG TERM GOAL #3   Title  Pt. will improve left hand [facility] skills  to be able to button clothing efficiently    Baseline  Pt. has difficulty manipulating buttons efficiently.    Time  12    Period  Weeks    Status  On-going    Target Date  02/04/18      OT LONG TERM GOAL #4   Title  Pt. will independently write one sentence efficiently with 75% legibility.    Baseline  One 10 work sentence with 25% legibility in 56 sec. with deviation from the line.    Time  12    Period  Weeks    Status  On-going    Target Date  02/04/18      OT LONG TERM GOAL #5    Title  Pt. independently demonstrate work simplification strategies, and compensatory startegies for safe IADL tasks/home management tasks.       Baseline  Pt. continues to have difficulty    Time  12    Period  Weeks    Status  On-going    Target Date  02/04/18      OT LONG TERM GOAL #6   Title  Pt. will independently demonstrate work simplification strategies, and compensatory startegies within the kitchen from w/c height.    Baseline  Continue    Time  12    Period  Weeks    Status  On-going    Target Date  02/04/18      OT LONG TERM GOAL #7   Title  Pt. will be able to complete toilet hygiene care with supervision.    Baseline  Pt. is unable to perform the task.    Time  12    Period  Weeks    Status  New    Target Date  02/04/18            Plan - 01/07/18 1439    Clinical Impression Statement  Pt worked on Engineer, production with a focus on increasing the size of her writing as pt. presents with micrographia. Pt demonstrates increased writing size without cuing today. Pt's legibility tends to decrease as the size of her writing decreasing. Pt to continue to work to increase writing legibility as she focuses on increasing her writing size. Pt to continue to work to improve writing legibility, speed, and size to promote independence during IADLs.    Occupational Profile and client history currently impacting functional performance  Pt. resides with her husband who is a Theme park manager. Pt. has grown children.    Occupational performance deficits (Please refer to evaluation for details):  ADL's;IADL's    Current Impairments/barriers affecting progress:  Positive Barriers: age, motivation, family support. Negative Barriers: multiple comorbidities, history of multiple falls.    OT Frequency  2x / week    OT Duration  12 weeks    OT Treatment/Interventions  Self-care/ADL training;Neuromuscular education;Therapeutic exercise;DME and/or AE instruction;Visual/perceptual  remediation/compensation;Patient/family education;Therapeutic activities    Clinical Decision Making  Several treatment options, min-mod task modification necessary    Consulted and Agree with Plan of Care  Patient       Patient will benefit from skilled therapeutic intervention in order to improve the following deficits and impairments:  Abnormal gait, Decreased cognition, Pain, Decreased strength, Impaired UE functional use, Decreased activity tolerance, Decreased balance  Visit Diagnosis: Other lack of coordination  Muscle weakness (generalized)    Problem List Patient Active Problem List   Diagnosis Date Noted  . Pain and swelling of right lower extremity 01/02/2017  . Parkinson's disease ([place]) 01/02/2017    Oliver Hum, OTS 01/07/2018, 2:47 PM   This entire session was performed under direct supervision and direction of a licensed therapist/therapist assistant . I have personally read, edited and approve of the note as written.  Harrel Carina, MS, OTR/L   [place] MAIN [facility] SERVICES [address] [place], [place], [address] Phone: [phone]   Fax:  [phone]  Name: Graceann Boileau MRN: [id number] Date of Birth: 1941-02-08

## 2018-01-07 NOTE — Therapy (Signed)
[place] MAIN [facility] SERVICES [address] [place], [place], [address] Phone: [phone]   Fax:  [phone]  Physical Therapy Treatment  Patient Details  Name: Kelsey Owens MRN: [id number] Date of Birth: [birth year] Referring Provider (PT): Jennelle Human   Encounter Date: 01/07/2018  PT End of Session - 01/07/18 1610    Visit Number  30    Number of Visits  33    Date for PT Re-Evaluation  02/21/18    Authorization Type  1/10    PT Start Time  1520    PT Stop Time  1600    PT Time Calculation (min)  40 min    Equipment Utilized During Treatment  Gait belt    Activity Tolerance  Patient tolerated treatment well    Behavior During Therapy  WFL for tasks assessed/performed       Past Medical History:  Diagnosis Date  . Arthritis   . Bursitis   . CAD (coronary artery disease)   . GERD (gastroesophageal reflux disease)   . Hyperlipidemia   . Hypertension   . Hypophonia   . Intercostal pain   . Parkinson disease ([place])   . Paroxysmal supraventricular tachycardia [facility])     Past Surgical History:  Procedure Laterality Date  . ABDOMINAL HYSTERECTOMY    . CHOLECYSTECTOMY    . DEEP BRAIN STIMULATOR PLACEMENT    . FLEXIBLE SIGMOIDOSCOPY N/A 12/19/2017   Procedure: FLEXIBLE SIGMOIDOSCOPY;  Surgeon: Manya Silvas, MD;  Location: [facility] ENDOSCOPY;  Service: Endoscopy;  Laterality: N/A;  . HEMORROIDECTOMY      There were no vitals filed for this visit.  Subjective Assessment - 01/07/18 1608    Subjective  Patient reports that she just woke up froma nap before coming to therapy and is feeling tired. She denies any falls or significant changes since her last visit.    Patient is accompained by:  Family member    Pertinent History  Patient has had parkinsons disease for the last 20 years. Her mobility has been getting worse after she had a left radius fx.  She was walking with RW in 10/18.  Patient has worseing loss of balance  beginning 10/18.  She had HHPT 2 x week for 2 months beginining after hospitilization in Dec 2018.     Patient Stated Goals  Patient wants to be able to walk better and not fall as often    Currently in Pain?  No/denies    Pain Score  0-No pain    Pain Onset  More than a month ago       TREATMENT  Therapeutic Exercise: Seated LAQ 2x20 each leg Seated marching 2x20 each leg, VCs for controlled descent  Therapeutic Activity: Sit <>stand transfer sequencing, safety x8, MOD A x1, VCs and TCs for hand positioning and sequencing Supported standing w/ RW 3x 1 min, MOD A x1 to prevent posterior lean, max VCs for weight bearing through BLE and feet Supported standing with weight-shift x20 each leg, MOD A x1     PT Education - 01/07/18 1609    Education Details  transfers, safety strategies, body mechanics for safe movement    Person(s) Educated  Patient    Methods  Explanation;Tactile cues;Demonstration;Verbal cues    Comprehension  Verbalized understanding;Need further instruction;Verbal cues required;Tactile cues required       PT Short Term Goals - 10/25/17 1317      PT SHORT TERM GOAL #1   Title  Patient will  be independent in home exercise program to improve strength/mobility for better functional independence with ADLs.    Time  4    Period  Weeks    Status  Achieved    Target Date  10/25/17      PT SHORT TERM GOAL #2   Title  Patient will perofrm sit to stand with correct hand placement and without support on the back of the chair with RW  and SBA.     Baseline  Patient needs min assist and needs min assist for correct hand placement.    Time  4    Period  Weeks    Status  Partially Met    Target Date  12/03/17      PT SHORT TERM GOAL #3   Title  Patient will stand for 1 minute with UE support to be able to perform safe transfers.     Baseline  Patient can stand for 10 seconds    Time  4    Period  Weeks    Status  On-going    Target Date  12/03/17        PT  Long Term Goals - 01/03/18 1533      PT LONG TERM GOAL #1   Title  Patient will be able to ambulate in parallel bars x 10 feet with min assist.    Baseline  ; Patient is ambulating wiht rollator 25 feet and min assist,     Time  12    Period  Weeks    Status  Achieved    Target Date  02/28/18      PT LONG TERM GOAL #2   Title  Patient will ambulate short distances ,25 feet, with RW and mod assist.     Baseline  : 08/22/17 patient is ambulating 15 feet with RW:09/12/17 25 feet with min assist and rollator, 10/25/17, 12/03/17 patient ambulates 100 feet with min assist and rollator, 01/03/18= mod assist for 100 feet with rollator    Time  12    Period  Weeks    Status  Partially Met    Target Date  02/28/18      PT LONG TERM GOAL #3   Title  Patient will be able to ascend 1 step with min assist with LARD.    Baseline  01/03/18=max assist with HHA    Time  12    Period  Weeks    Status  On-going    Target Date  02/28/18      PT LONG TERM GOAL #4   Title  Patient (> 61 years old) will complete five times sit to stand test in < 15 seconds indicating an increased LE strength and improved balance.    Baseline  01/03/18= 57.45 sec with mod assist     Time  8    Period  Weeks    Status  New    Target Date  02/28/18      PT LONG TERM GOAL #5   Title  Patient will be able to perform sit to stand transfer with sBA from [facility]     Baseline  01/03/18 needs mod assist    Time  8    Period  Weeks    Status  New    Target Date  02/28/17            Plan - 01/07/18 1611    Clinical Impression Statement  Patient presents to clinic with increased fatigue but is amenable  to therapy. Patient requires MOD A x1 for sit to stand and maximum verbal and tactile cues for safe transfers. Patient continues to demonstrate a considerable posterior lean in standing with max VCs for bearing weight through the whole foot. Patient will continue to benefit from skilled therapeutic intervention to improve  mobility and safety.    Rehab Potential  Good    PT Frequency  2x / week    PT Duration  12 weeks    PT Treatment/Interventions  Therapeutic exercise;Therapeutic activities;Functional mobility training;Stair training;Balance training;Neuromuscular re-education;Patient/family education;Aquatic Therapy    PT Next Visit Plan  mobility training, strengthening, balanace training    PT Home Exercise Plan  LAQ, hip flex    Consulted and Agree with Plan of Care  Patient;Family member/caregiver       Patient will benefit from skilled therapeutic intervention in order to improve the following deficits and impairments:  Decreased balance, Decreased endurance, Decreased mobility, Decreased safety awareness, Decreased strength, Decreased activity tolerance  Visit Diagnosis: Other lack of coordination  Muscle weakness (generalized)  Difficulty in walking, not elsewhere classified  Parkinson's disease ([facility])  Pain and swelling of right lower extremity     Problem List Patient Active Problem List   Diagnosis Date Noted  . Pain and swelling of right lower extremity 01/02/2017  . Parkinson's disease ([place]) 01/02/2017   Myles Gip PT, DPT [phone] 01/07/2018, 4:14 PM  [place] MAIN [facility] SERVICES [address] [place], [place], [address] Phone: [phone]   Fax:  [phone]  Name: Kelsey Owens MRN: [id number] Date of Birth: 06-Dec-1940

## 2018-01-08 ENCOUNTER — Encounter: Payer: Medicare Other | Admitting: Occupational Therapy

## 2018-01-14 ENCOUNTER — Ambulatory Visit: Payer: Medicare Other | Attending: Family Medicine | Admitting: Occupational Therapy

## 2018-01-14 DIAGNOSIS — M6281 Muscle weakness (generalized): Secondary | ICD-10-CM | POA: Insufficient documentation

## 2018-01-14 DIAGNOSIS — R262 Difficulty in walking, not elsewhere classified: Secondary | ICD-10-CM | POA: Insufficient documentation

## 2018-01-14 DIAGNOSIS — M7989 Other specified soft tissue disorders: Secondary | ICD-10-CM | POA: Insufficient documentation

## 2018-01-14 DIAGNOSIS — G20 Parkinson's disease: Secondary | ICD-10-CM | POA: Diagnosis present

## 2018-01-14 DIAGNOSIS — M79604 Pain in right leg: Secondary | ICD-10-CM | POA: Diagnosis present

## 2018-01-14 DIAGNOSIS — R278 Other lack of coordination: Secondary | ICD-10-CM

## 2018-01-14 NOTE — Therapy (Signed)
[place] MAIN [facility] SERVICES [address] [place], [place], [address] Phone: [phone]   Fax:  [phone]  Occupational Therapy Treatment  Patient Details  Name: Kelsey Owens MRN: [id number] Date of Birth: 05/15/1940 No data recorded  Encounter Date: 01/14/2018  OT End of Session - 01/14/18 1353    Visit Number  21    Number of Visits  24    Date for OT Re-Evaluation  02/04/18    Authorization Time Period  Visit 5 of 10 for progress report period starting 11/12/2017    OT Start Time  1347    OT Stop Time  1430    OT Time Calculation (min)  43 min    Activity Tolerance  Patient tolerated treatment well    Behavior During Therapy  [facility] for tasks assessed/performed       Past Medical History:  Diagnosis Date  . Arthritis   . Bursitis   . CAD (coronary artery disease)   . GERD (gastroesophageal reflux disease)   . Hyperlipidemia   . Hypertension   . Hypophonia   . Intercostal pain   . Parkinson disease ([place])   . Paroxysmal supraventricular tachycardia [facility])     Past Surgical History:  Procedure Laterality Date  . ABDOMINAL HYSTERECTOMY    . CHOLECYSTECTOMY    . DEEP BRAIN STIMULATOR PLACEMENT    . FLEXIBLE SIGMOIDOSCOPY N/A 12/19/2017   Procedure: FLEXIBLE SIGMOIDOSCOPY;  Surgeon: Manya Silvas, MD;  Location: [facility] ENDOSCOPY;  Service: Endoscopy;  Laterality: N/A;  . HEMORROIDECTOMY      There were no vitals filed for this visit.  Subjective Assessment - 01/14/18 1352    Subjective   Pt. reports that she feels great.    Patient is accompained by:  Family member    Pertinent History  Pt. is a 77 y.o. female with a history of Parkinsons Disease. Pt. has a Deep Brain Stimulator, and has recently had it adjusted. Pt. has had multiple falls, and has a history of multiple wrist fractures.     Currently in Pain?  No/denies      OT TREATMENT    Neuro muscular re-education:  Pt. worked on bilateral [facility] skills untying  knots of varying sizes, and thickness. Pt started with thicker rope, and progressed to smaller string. Pt. Worked on writing, and writing, and writing legibility. Pt. Worked on formulating letters, spacing, and minimizing line deviation.                           OT Education - 01/14/18 1353    Education Details  right hand [facility] skills    Person(s) Educated  Patient    Methods  Explanation;Demonstration    Comprehension  Verbalized understanding;Returned demonstration          OT Long Term Goals - 11/12/17 1441      OT LONG TERM GOAL #1   Title  Pt. will improve BUE strength by 2 muscle grades to improve ADL, and IADL functioning.    Baseline  Pt. conitnues to present with decreased UE strength    Time  12    Period  Weeks    Status  On-going    Target Date  02/04/18      OT LONG TERM GOAL #2   Title  Pt. will increase Left grip strength by 5# to be able to open jars, and containers.    Baseline  Limited grip strength  Time  12    Period  Weeks    Status  On-going    Target Date  02/04/18      OT LONG TERM GOAL #3   Title  Pt. will improve left hand [facility] skills  to be able to button clothing efficiently    Baseline  Pt. has difficulty manipulating buttons efficiently.    Time  12    Period  Weeks    Status  On-going    Target Date  02/04/18      OT LONG TERM GOAL #4   Title  Pt. will independently write one sentence efficiently with 75% legibility.    Baseline  One 10 work sentence with 25% legibility in 56 sec. with deviation from the line.    Time  12    Period  Weeks    Status  On-going    Target Date  02/04/18      OT LONG TERM GOAL #5   Title  Pt. independently demonstrate work simplification strategies, and compensatory startegies for safe IADL tasks/home management tasks.       Baseline  Pt. continues to have difficulty    Time  12    Period  Weeks    Status  On-going    Target Date  02/04/18      OT LONG TERM GOAL #6   Title  Pt.  will independently demonstrate work simplification strategies, and compensatory startegies within the kitchen from w/c height.    Baseline  Continue    Time  12    Period  Weeks    Status  On-going    Target Date  02/04/18      OT LONG TERM GOAL #7   Title  Pt. will be able to complete toilet hygiene care with supervision.    Baseline  Pt. is unable to perform the task.    Time  12    Period  Weeks    Status  New    Target Date  02/04/18            Plan - 01/14/18 1356    Clinical Impression Statement  Pt. is making progress overall. Pt. continues to present with limited UE strength, motor control, and [facility] skills. Pt. continues to work on improving writing legibility, speed, and spacing.  Pt. continues to work on improving overall ADL, and IADL functioning.    Occupational Profile and client history currently impacting functional performance  Pt. resides with her husband who is a Theme park manager. Pt. has grown children.    Occupational performance deficits (Please refer to evaluation for details):  ADL's;IADL's    Current Impairments/barriers affecting progress:  Positive Barriers: age, motivation, family support. Negative Barriers: multiple comorbidities, history of multiple falls.    OT Frequency  2x / week    OT Duration  12 weeks    OT Treatment/Interventions  Self-care/ADL training;Neuromuscular education;Therapeutic exercise;DME and/or AE instruction;Visual/perceptual remediation/compensation;Patient/family education;Therapeutic activities    Clinical Decision Making  Several treatment options, min-mod task modification necessary    Consulted and Agree with Plan of Care  Patient       Patient will benefit from skilled therapeutic intervention in order to improve the following deficits and impairments:  Abnormal gait, Decreased cognition, Pain, Decreased strength, Impaired UE functional use, Decreased activity tolerance, Decreased balance  Visit Diagnosis: Muscle weakness  (generalized)  Other lack of coordination    Problem List Patient Active Problem List   Diagnosis Date Noted  . Pain and  swelling of right lower extremity 01/02/2017  . Parkinson's disease ([place]) 01/02/2017    Harrel Carina, MS, OTR/L 01/14/2018, 2:11 PM  [place] MAIN [facility] SERVICES [address] [place], [place], [address] Phone: [phone]   Fax:  [phone]  Name: Kelsey Owens MRN: [id number] Date of Birth: 12-14-1940

## 2018-01-17 ENCOUNTER — Encounter: Payer: Self-pay | Admitting: Physical Therapy

## 2018-01-17 ENCOUNTER — Ambulatory Visit: Payer: Medicare Other | Admitting: Physical Therapy

## 2018-01-17 DIAGNOSIS — G20 Parkinson's disease: Secondary | ICD-10-CM

## 2018-01-17 DIAGNOSIS — M6281 Muscle weakness (generalized): Secondary | ICD-10-CM | POA: Diagnosis not present

## 2018-01-17 DIAGNOSIS — R262 Difficulty in walking, not elsewhere classified: Secondary | ICD-10-CM

## 2018-01-17 DIAGNOSIS — R278 Other lack of coordination: Secondary | ICD-10-CM

## 2018-01-17 DIAGNOSIS — G20A1 Parkinson's disease without dyskinesia, without mention of fluctuations: Secondary | ICD-10-CM

## 2018-01-17 NOTE — Therapy (Signed)
[place] MAIN [facility] SERVICES [address] [place], [place], [address] Phone: [phone]   Fax:  [phone]  Physical Therapy Treatment  Patient Details  Name: Kelsey Owens MRN: [id number] Date of Birth: 1941/02/11 Referring Provider (PT): Jennelle Human   Encounter Date: 01/17/2018  PT End of Session - 01/17/18 1602    Visit Number  31    Number of Visits  33    Date for PT Re-Evaluation  02/21/18    Authorization Type  2/10    PT Start Time  0145    PT Stop Time  0230    PT Time Calculation (min)  45 min    Equipment Utilized During Treatment  Gait belt    Activity Tolerance  Patient tolerated treatment well    Behavior During Therapy  WFL for tasks assessed/performed       Past Medical History:  Diagnosis Date  . Arthritis   . Bursitis   . CAD (coronary artery disease)   . GERD (gastroesophageal reflux disease)   . Hyperlipidemia   . Hypertension   . Hypophonia   . Intercostal pain   . Parkinson disease ([place])   . Paroxysmal supraventricular tachycardia [facility])     Past Surgical History:  Procedure Laterality Date  . ABDOMINAL HYSTERECTOMY    . CHOLECYSTECTOMY    . DEEP BRAIN STIMULATOR PLACEMENT    . FLEXIBLE SIGMOIDOSCOPY N/A 12/19/2017   Procedure: FLEXIBLE SIGMOIDOSCOPY;  Surgeon: Manya Silvas, MD;  Location: [facility] ENDOSCOPY;  Service: Endoscopy;  Laterality: N/A;  . HEMORROIDECTOMY      There were no vitals filed for this visit.  Subjective Assessment - 01/17/18 1445    Subjective  Patient is not hurting today. She is doing better and her shoulder is better.     Currently in Pain?  No/denies    Pain Score  0-No pain       TREATMENT  Therapeutic Exercise: Seated LAQ 2x20 each leg Seated marching 2x20 each leg, VCs for controlled descent  Gait training with rollator 100 feet x 1, 120 feet x 1 with mod assist for pushing the rollator and posture cues  Therapeutic Activity: Sit <>stand transfer  sequencing, safety x8, MOD A x1, VCs and TCs for hand positioning and sequencing Supported standing w/ RW 3x 1 min, MOD A x1 to prevent posterior lean, max VCs for weight bearing through BLE and feet Supported standing with weight-shift x20 each leg, MOD A x1                        PT Education - 01/17/18 1602    Education Details  HEP    Person(s) Educated  Patient    Methods  Explanation    Comprehension  Verbalized understanding       PT Short Term Goals - 10/25/17 1317      PT SHORT TERM GOAL #1   Title  Patient will be independent in home exercise program to improve strength/mobility for better functional independence with ADLs.    Time  4    Period  Weeks    Status  Achieved    Target Date  10/25/17      PT SHORT TERM GOAL #2   Title  Patient will perofrm sit to stand with correct hand placement and without support on the back of the chair with RW  and SBA.     Baseline  Patient needs min assist and needs  min assist for correct hand placement.    Time  4    Period  Weeks    Status  Partially Met    Target Date  12/03/17      PT SHORT TERM GOAL #3   Title  Patient will stand for 1 minute with UE support to be able to perform safe transfers.     Baseline  Patient can stand for 10 seconds    Time  4    Period  Weeks    Status  On-going    Target Date  12/03/17        PT Long Term Goals - 01/03/18 1533      PT LONG TERM GOAL #1   Title  Patient will be able to ambulate in parallel bars x 10 feet with min assist.    Baseline  ; Patient is ambulating wiht rollator 25 feet and min assist,     Time  12    Period  Weeks    Status  Achieved    Target Date  02/28/18      PT LONG TERM GOAL #2   Title  Patient will ambulate short distances ,25 feet, with RW and mod assist.     Baseline  : 08/22/17 patient is ambulating 15 feet with RW:09/12/17 25 feet with min assist and rollator, 10/25/17, 12/03/17 patient ambulates 100 feet with min assist and  rollator, 01/03/18= mod assist for 100 feet with rollator    Time  12    Period  Weeks    Status  Partially Met    Target Date  02/28/18      PT LONG TERM GOAL #3   Title  Patient will be able to ascend 1 step with min assist with LARD.    Baseline  01/03/18=max assist with HHA    Time  12    Period  Weeks    Status  On-going    Target Date  02/28/18      PT LONG TERM GOAL #4   Title  Patient (> 38 years old) will complete five times sit to stand test in < 15 seconds indicating an increased LE strength and improved balance.    Baseline  01/03/18= 57.45 sec with mod assist     Time  8    Period  Weeks    Status  New    Target Date  02/28/18      PT LONG TERM GOAL #5   Title  Patient will be able to perform sit to stand transfer with sBA from [facility]     Baseline  01/03/18 needs mod assist    Time  8    Period  Weeks    Status  New    Target Date  02/28/17            Plan - 01/17/18 1603    Clinical Impression Statement  Patient continues to have weakness in BLE and decreased mobility with transfers and ambulation with rW. She needs mod assist for sit to stand and mod assist for gait with rollator. She has new goals set for mobility and saftey and will continue to benefit from skilled PT to improve mobiltiy and saftey.    Rehab Potential  Good    PT Frequency  2x / week    PT Duration  12 weeks    PT Treatment/Interventions  Therapeutic exercise;Therapeutic activities;Functional mobility training;Stair training;Balance training;Neuromuscular re-education;Patient/family education;Aquatic Therapy    PT Next Visit Plan  mobility training, strengthening, balanace training    PT [place], hip flex    Consulted and Agree with Plan of Care  Patient;Family member/caregiver       Patient will benefit from skilled therapeutic intervention in order to improve the following deficits and impairments:  Decreased balance, Decreased endurance, Decreased mobility, Decreased  safety awareness, Decreased strength, Decreased activity tolerance  Visit Diagnosis: Muscle weakness (generalized)  Other lack of coordination  Difficulty in walking, not elsewhere classified  Parkinson's disease [facility])     Problem List Patient Active Problem List   Diagnosis Date Noted  . Pain and swelling of right lower extremity 01/02/2017  . Parkinson's disease ([place]) 01/02/2017    Alanson Puls, PT DPT 01/17/2018, 4:11 PM  [place] MAIN [facility] SERVICES [address] [place], [place], [address] Phone: [phone]   Fax:  [phone]  Name: Kelsey Owens MRN: [id number] Date of Birth: [birth year]

## 2018-01-21 ENCOUNTER — Ambulatory Visit: Payer: Medicare Other | Admitting: Physical Therapy

## 2018-01-21 ENCOUNTER — Encounter: Payer: Self-pay | Admitting: Physical Therapy

## 2018-01-21 DIAGNOSIS — G20 Parkinson's disease: Secondary | ICD-10-CM

## 2018-01-21 DIAGNOSIS — M6281 Muscle weakness (generalized): Secondary | ICD-10-CM | POA: Diagnosis not present

## 2018-01-21 DIAGNOSIS — R262 Difficulty in walking, not elsewhere classified: Secondary | ICD-10-CM

## 2018-01-21 DIAGNOSIS — R278 Other lack of coordination: Secondary | ICD-10-CM

## 2018-01-21 NOTE — Patient Instructions (Signed)
Knee Extension: Resisted (Sitting)    With band looped around right ankle and under other foot, straighten leg with ankle loop. Keep other leg bent to increase resistance. Repeat __20__ times per set. Do __2__ sets per session. Do ___7_ sessions per day.  http://orth.exer.us/691   Copyright  VHI. All rights reserved.  HIP / KNEE: Flexion - Sitting    Raise knee to chest. Keep back straight, knee bent. __20_ reps per set, __2_ sets per day, _7__ days per week  Copyright  VHI. All rights reserved.  External Rotation: Hip - Knees Apart (Sitting)    Sit, band tied just above knees. Pull knees apart. Hold for 3___ seconds. Rest for _3_ seconds. Repeat __20_ times. Do _2__ times a day.  Copyright  VHI. All rights reserved.  B[place]s on One Leg - Seated    Start in Seated Horse Stance, palms resting on thighs. Keeping torso upright, shift weight onto one buttock pressing same side foot into floor. Lift unweighted foot if able and Hold _3__ seconds. Repeat _20__ times each side.  Copyright  VHI. All rights reserved.

## 2018-01-21 NOTE — Therapy (Signed)
[place] MAIN [facility] SERVICES [address] [place], [place], [address] Phone: [phone]   Fax:  [phone]  Physical Therapy Treatment  Patient Details  Name: Kelsey Owens MRN: [id number] Date of Birth: 11-16-1940 Referring Provider (PT): Jennelle Human   Encounter Date: 01/21/2018  PT End of Session - 01/21/18 1324    Visit Number  32    Number of Visits  33    Date for PT Re-Evaluation  02/21/18    Authorization Type  3/10    PT Start Time  0105    PT Stop Time  0145    PT Time Calculation (min)  40 min    Equipment Utilized During Treatment  Gait belt    Activity Tolerance  Patient tolerated treatment well    Behavior During Therapy  WFL for tasks assessed/performed       Past Medical History:  Diagnosis Date  . Arthritis   . Bursitis   . CAD (coronary artery disease)   . GERD (gastroesophageal reflux disease)   . Hyperlipidemia   . Hypertension   . Hypophonia   . Intercostal pain   . Parkinson disease ([place])   . Paroxysmal supraventricular tachycardia [facility])     Past Surgical History:  Procedure Laterality Date  . ABDOMINAL HYSTERECTOMY    . CHOLECYSTECTOMY    . DEEP BRAIN STIMULATOR PLACEMENT    . FLEXIBLE SIGMOIDOSCOPY N/A 12/19/2017   Procedure: FLEXIBLE SIGMOIDOSCOPY;  Surgeon: Manya Silvas, MD;  Location: [facility] ENDOSCOPY;  Service: Endoscopy;  Laterality: N/A;  . HEMORROIDECTOMY      There were no vitals filed for this visit.  Subjective Assessment - 01/21/18 1312    Subjective  Patient is not hurting today. She is doing better and her shoulder is hurting 4/10.    Patient is accompained by:  Family member    Pertinent History  Patient has had parkinsons disease for the last 20 years. Her mobility has been getting worse after she had a left radius fx.  She was walking with RW in 10/18.  Patient has worseing loss of balance beginning 10/18.  She had HHPT 2 x week for 2 months beginining after  hospitilization in Dec 2018.     Patient Stated Goals  Patient wants to be able to walk better and not fall as often    Currently in Pain?  Yes    Pain Score  4     Pain Location  Shoulder    Pain Orientation  Right    Pain Descriptors / Indicators  Aching    Pain Onset  More than a month ago    Aggravating Factors   moving it    Pain Relieving Factors  resting it    Effect of Pain on Daily Activities  difficult to walk with RW due to needing to use her RUE       Treatment: Reviewed HEP and given hand out  Transfer training from various surface levels from various heights with mod assist from sit to rollator. Sit to stand x 5 x 2 sets with VC for correct hand placement and sequencing Gait training with rollator and mod assist x 100 feet x 2  Patient needs moderate verbal assist for motor planning and LE placement. Patient has freezing episodes and needs assist for how to turn, back up, side step closer to sitting surface.  Patient has limited standing time doe to fatigue.  PT Education - 01/21/18 1313    Education Details  HEP    Person(s) Educated  Patient    Methods  Explanation    Comprehension  Verbalized understanding       PT Short Term Goals - 10/25/17 1317      PT SHORT TERM GOAL #1   Title  Patient will be independent in home exercise program to improve strength/mobility for better functional independence with ADLs.    Time  4    Period  Weeks    Status  Achieved    Target Date  10/25/17      PT SHORT TERM GOAL #2   Title  Patient will perofrm sit to stand with correct hand placement and without support on the back of the chair with RW  and SBA.     Baseline  Patient needs min assist and needs min assist for correct hand placement.    Time  4    Period  Weeks    Status  Partially Met    Target Date  12/03/17      PT SHORT TERM GOAL #3   Title  Patient will stand for 1 minute with UE support to be able to perform  safe transfers.     Baseline  Patient can stand for 10 seconds    Time  4    Period  Weeks    Status  On-going    Target Date  12/03/17        PT Long Term Goals - 01/03/18 1533      PT LONG TERM GOAL #1   Title  Patient will be able to ambulate in parallel bars x 10 feet with min assist.    Baseline  ; Patient is ambulating wiht rollator 25 feet and min assist,     Time  12    Period  Weeks    Status  Achieved    Target Date  02/28/18      PT LONG TERM GOAL #2   Title  Patient will ambulate short distances ,25 feet, with RW and mod assist.     Baseline  : 08/22/17 patient is ambulating 15 feet with RW:09/12/17 25 feet with min assist and rollator, 10/25/17, 12/03/17 patient ambulates 100 feet with min assist and rollator, 01/03/18= mod assist for 100 feet with rollator    Time  12    Period  Weeks    Status  Partially Met    Target Date  02/28/18      PT LONG TERM GOAL #3   Title  Patient will be able to ascend 1 step with min assist with LARD.    Baseline  01/03/18=max assist with HHA    Time  12    Period  Weeks    Status  On-going    Target Date  02/28/18      PT LONG TERM GOAL #4   Title  Patient (> 72 years old) will complete five times sit to stand test in < 15 seconds indicating an increased LE strength and improved balance.    Baseline  01/03/18= 57.45 sec with mod assist     Time  8    Period  Weeks    Status  New    Target Date  02/28/18      PT LONG TERM GOAL #5   Title  Patient will be able to perform sit to stand transfer with sBA from [facility]     Baseline  01/03/18 needs mod assist    Time  8    Period  Weeks    Status  New    Target Date  02/28/17            Plan - 01/21/18 1324    Clinical Impression Statement  Patient has decreased motor planning and motor control of functional mobiity. She has weakness in BLE and difficutly with gait , needing asssit to propel the rollator and assist for standing balance with posterior lean. She performs BLE  exercises and gait with rollator 100 feet x 2 . She will continue to benefit from skilled PT to improve gait and transfer mobility and strength.     Rehab Potential  Good    PT Frequency  2x / week    PT Duration  12 weeks    PT Treatment/Interventions  Therapeutic exercise;Therapeutic activities;Functional mobility training;Stair training;Balance training;Neuromuscular re-education;Patient/family education;Aquatic Therapy    PT Next Visit Plan  mobility training, strengthening, balanace training    PT Home Exercise Plan  LAQ, hip flex    Consulted and Agree with Plan of Care  Patient;Family member/caregiver       Patient will benefit from skilled therapeutic intervention in order to improve the following deficits and impairments:  Decreased balance, Decreased endurance, Decreased mobility, Decreased safety awareness, Decreased strength, Decreased activity tolerance  Visit Diagnosis: Muscle weakness (generalized)  Other lack of coordination  Difficulty in walking, not elsewhere classified  Parkinson's disease [facility])     Problem List Patient Active Problem List   Diagnosis Date Noted  . Pain and swelling of right lower extremity 01/02/2017  . Parkinson's disease ([place]) 01/02/2017    Alanson Puls, PT DPT 01/21/2018, 1:37 PM  [place] MAIN [facility] SERVICES [address] [place], [place], [address] Phone: [phone]   Fax:  [phone]  Name: Quinnlyn Hearns MRN: [id number] Date of Birth: [birth year]

## 2018-01-24 ENCOUNTER — Ambulatory Visit: Payer: Medicare Other | Admitting: Physical Therapy

## 2018-01-24 ENCOUNTER — Encounter: Payer: Self-pay | Admitting: Physical Therapy

## 2018-01-24 ENCOUNTER — Ambulatory Visit: Payer: Medicare Other | Admitting: Occupational Therapy

## 2018-01-24 ENCOUNTER — Encounter: Payer: Self-pay | Admitting: Occupational Therapy

## 2018-01-24 DIAGNOSIS — M6281 Muscle weakness (generalized): Secondary | ICD-10-CM

## 2018-01-24 DIAGNOSIS — R278 Other lack of coordination: Secondary | ICD-10-CM

## 2018-01-24 DIAGNOSIS — G20 Parkinson's disease: Secondary | ICD-10-CM

## 2018-01-24 DIAGNOSIS — M7989 Other specified soft tissue disorders: Secondary | ICD-10-CM

## 2018-01-24 DIAGNOSIS — R262 Difficulty in walking, not elsewhere classified: Secondary | ICD-10-CM

## 2018-01-24 DIAGNOSIS — M79604 Pain in right leg: Secondary | ICD-10-CM

## 2018-01-24 NOTE — Therapy (Signed)
[place] MAIN [facility] SERVICES [address] [place], [place], [address] Phone: [phone]   Fax:  [phone]  Occupational Therapy Treatment  Patient Details  Name: Kelsey Owens MRN: [id number] Date of Birth: [birth year] No data recorded  Encounter Date: 01/24/2018  OT End of Session - 01/24/18 1447    Visit Number  22    Number of Visits  24    Date for OT Re-Evaluation  02/04/18    Authorization Time Period  Visit 6 of 10 for progress report period starting 11/12/2017    OT Start Time  1430    OT Stop Time  1515    OT Time Calculation (min)  45 min    Activity Tolerance  Patient tolerated treatment well    Behavior During Therapy  [facility] for tasks assessed/performed       Past Medical History:  Diagnosis Date  . Arthritis   . Bursitis   . CAD (coronary artery disease)   . GERD (gastroesophageal reflux disease)   . Hyperlipidemia   . Hypertension   . Hypophonia   . Intercostal pain   . Parkinson disease ([place])   . Paroxysmal supraventricular tachycardia [facility])     Past Surgical History:  Procedure Laterality Date  . ABDOMINAL HYSTERECTOMY    . CHOLECYSTECTOMY    . DEEP BRAIN STIMULATOR PLACEMENT    . FLEXIBLE SIGMOIDOSCOPY N/A 12/19/2017   Procedure: FLEXIBLE SIGMOIDOSCOPY;  Surgeon: Manya Silvas, MD;  Location: [facility] ENDOSCOPY;  Service: Endoscopy;  Laterality: N/A;  . HEMORROIDECTOMY      There were no vitals filed for this visit.  Subjective Assessment - 01/24/18 1442    Subjective   Pt. reports that she is feeling better.    Patient is accompained by:  Family member    Pertinent History  Pt. is a 77 y.o. female with a history of Parkinsons Disease. Pt. has a Deep Brain Stimulator, and has recently had it adjusted. Pt. has had multiple falls, and has a history of multiple wrist fractures.     Patient Stated Goals  To be as independent as possible.     Currently in Pain?  Yes    Pain Score  6     Pain Location   Shoulder    Pain Orientation  Right    Pain Descriptors / Indicators  Aching    Pain Onset  More than a month ago      OT TREATMENT    Neuro muscular re-education:  Pt. worked on Building surveyor copying a recipe with 25% legibility, and increased deviation from the lines on a recipe card.  Pt. worked on bilateral hand coordination skills disconnecting magnetic resistive magnets, and placing them onto a whiteboard. Pt. worked on [facility] skills grasping, and manipulating earring backings from a container, and placing them onto a small dowel to simulate connecting earrings. Pt. worked on bilateral hand coordination skills untying knots.                            OT Education - 01/24/18 1447    Education Details  right hand [facility] skills    Person(s) Educated  Patient    Methods  Explanation;Demonstration    Comprehension  Verbalized understanding;Returned demonstration          OT Long Term Goals - 11/12/17 1441      OT LONG TERM GOAL #1   Title  Pt. will improve  BUE strength by 2 muscle grades to improve ADL, and IADL functioning.    Baseline  Pt. conitnues to present with decreased UE strength    Time  12    Period  Weeks    Status  On-going    Target Date  02/04/18      OT LONG TERM GOAL #2   Title  Pt. will increase Left grip strength by 5# to be able to open jars, and containers.    Baseline  Limited grip strength    Time  12    Period  Weeks    Status  On-going    Target Date  02/04/18      OT LONG TERM GOAL #3   Title  Pt. will improve left hand [facility] skills  to be able to button clothing efficiently    Baseline  Pt. has difficulty manipulating buttons efficiently.    Time  12    Period  Weeks    Status  On-going    Target Date  02/04/18      OT LONG TERM GOAL #4   Title  Pt. will independently write one sentence efficiently with 75% legibility.    Baseline  One 10 work sentence with 25% legibility in 56 sec. with deviation from the line.    Time   12    Period  Weeks    Status  On-going    Target Date  02/04/18      OT LONG TERM GOAL #5   Title  Pt. independently demonstrate work simplification strategies, and compensatory startegies for safe IADL tasks/home management tasks.       Baseline  Pt. continues to have difficulty    Time  12    Period  Weeks    Status  On-going    Target Date  02/04/18      OT LONG TERM GOAL #6   Title  Pt. will independently demonstrate work simplification strategies, and compensatory startegies within the kitchen from w/c height.    Baseline  Continue    Time  12    Period  Weeks    Status  On-going    Target Date  02/04/18      OT LONG TERM GOAL #7   Title  Pt. will be able to complete toilet hygiene care with supervision.    Baseline  Pt. is unable to perform the task.    Time  12    Period  Weeks    Status  New    Target Date  02/04/18            Plan - 01/24/18 1448    Clinical Impression Statement  Pt. is making progress overall. Pt. continues to present with limited UE strength, motor control, and [facility] skills which limit her ability to open containers/jars, button shirts, perfrom toileting skills, and perform ADL, and IADL tasks. Pt. needs continued work on improving writing legibility, speed, and spacing.  Pt. continues to work on improving right hand [facility] skills, hand function, dexterity in order to improve overall ADL, and IADL functioning.    Occupational Profile and client history currently impacting functional performance  Pt. resides with her husband who is a Theme park manager. Pt. has grown children.    Occupational performance deficits (Please refer to evaluation for details):  ADL's;IADL's    Current Impairments/barriers affecting progress:  Positive Barriers: age, motivation, family support. Negative Barriers: multiple comorbidities, history of multiple falls.    OT Frequency  2x /  week    OT Duration  12 weeks    OT Treatment/Interventions  Self-care/ADL training;Neuromuscular  education;Therapeutic exercise;DME and/or AE instruction;Visual/perceptual remediation/compensation;Patient/family education;Therapeutic activities    Clinical Decision Making  Several treatment options, min-mod task modification necessary    Consulted and Agree with Plan of Care  Patient       Patient will benefit from skilled therapeutic intervention in order to improve the following deficits and impairments:  Abnormal gait, Decreased cognition, Pain, Decreased strength, Impaired UE functional use, Decreased activity tolerance, Decreased balance  Visit Diagnosis: Muscle weakness (generalized)    Problem List Patient Active Problem List   Diagnosis Date Noted  . Pain and swelling of right lower extremity 01/02/2017  . Parkinson's disease ([place]) 01/02/2017    Harrel Carina, MS, OTR/L 01/24/2018, 3:17 PM  [place] MAIN [facility] SERVICES [address] [place], [place], [address] Phone: [phone]   Fax:  [phone]  Name: Kelsey Owens MRN: [id number] Date of Birth: [birth year]

## 2018-01-24 NOTE — Therapy (Signed)
[place] MAIN [facility] SERVICES [address] [place], [place], [address] Phone: [phone]   Fax:  [phone]  Physical Therapy Treatment  Patient Details  Name: Kelsey Owens MRN: [id number] Date of Birth: 77-Nov-1942 Referring Provider (PT): Jennelle Human   Encounter Date: 01/24/2018  PT End of Session - 01/24/18 1455    Visit Number  33    Number of Visits  33    Date for PT Re-Evaluation  02/21/18    Authorization Type  4/10    PT Start Time  0145    PT Stop Time  0230    PT Time Calculation (min)  45 min    Equipment Utilized During Treatment  Gait belt    Activity Tolerance  Patient tolerated treatment well    Behavior During Therapy  WFL for tasks assessed/performed       Past Medical History:  Diagnosis Date  . Arthritis   . Bursitis   . CAD (coronary artery disease)   . GERD (gastroesophageal reflux disease)   . Hyperlipidemia   . Hypertension   . Hypophonia   . Intercostal pain   . Parkinson disease ([place])   . Paroxysmal supraventricular tachycardia [facility])     Past Surgical History:  Procedure Laterality Date  . ABDOMINAL HYSTERECTOMY    . CHOLECYSTECTOMY    . DEEP BRAIN STIMULATOR PLACEMENT    . FLEXIBLE SIGMOIDOSCOPY N/A 12/19/2017   Procedure: FLEXIBLE SIGMOIDOSCOPY;  Surgeon: Manya Silvas, MD;  Location: [facility] ENDOSCOPY;  Service: Endoscopy;  Laterality: N/A;  . HEMORROIDECTOMY      There were no vitals filed for this visit.  Subjective Assessment - 01/24/18 1454    Subjective  Patient is not hurting today. She is doing better and her shoulder is hurting 4/10.    Patient is accompained by:  Family member    Pertinent History  Patient has had parkinsons disease for the last 20 years. Her mobility has been getting worse after she had a left radius fx.  She was walking with RW in 10/18.  Patient has worseing loss of balance beginning 10/18.  She had HHPT 2 x week for 2 months beginining after  hospitilization in Dec 2018.     Patient Stated Goals  Patient wants to be able to walk better and not fall as often    Currently in Pain?  Yes    Pain Score  4     Pain Location  Shoulder    Pain Onset  More than a month ago       Therapeutic activities: Patient needs mod assist for transfers sit to stand with vc for sequencing and tactile cues for hand placement Patient ambulates with rollator 150 feet x 1, 100 feet x 2  and mod assist for assist to push the rollator and mod assist for posterior loss of balance Patient performs 5 times sit to stand with mod assist with UE support Patient performs seated LAQ with 2 lbs x 10 x 2 Patient performs seated hip abd/ER with YTB x 20  Patient is instructed in HEP for sit to stand with correct sequencing .                        PT Education - 01/24/18 1455    Education Details  HEP    Person(s) Educated  Patient    Methods  Explanation    Comprehension  Verbalized understanding  PT Short Term Goals - 10/25/17 1317      PT SHORT TERM GOAL #1   Title  Patient will be independent in home exercise program to improve strength/mobility for better functional independence with ADLs.    Time  4    Period  Weeks    Status  Achieved    Target Date  10/25/17      PT SHORT TERM GOAL #2   Title  Patient will perofrm sit to stand with correct hand placement and without support on the back of the chair with RW  and SBA.     Baseline  Patient needs min assist and needs min assist for correct hand placement.    Time  4    Period  Weeks    Status  Partially Met    Target Date  12/03/17      PT SHORT TERM GOAL #3   Title  Patient will stand for 1 minute with UE support to be able to perform safe transfers.     Baseline  Patient can stand for 10 seconds    Time  4    Period  Weeks    Status  On-going    Target Date  12/03/17        PT Long Term Goals - 01/03/18 1533      PT LONG TERM GOAL #1   Title  Patient  will be able to ambulate in parallel bars x 10 feet with min assist.    Baseline  ; Patient is ambulating wiht rollator 25 feet and min assist,     Time  12    Period  Weeks    Status  Achieved    Target Date  02/28/18      PT LONG TERM GOAL #2   Title  Patient will ambulate short distances ,25 feet, with RW and mod assist.     Baseline  : 08/22/17 patient is ambulating 15 feet with RW:09/12/17 25 feet with min assist and rollator, 10/25/17, 12/03/17 patient ambulates 100 feet with min assist and rollator, 01/03/18= mod assist for 100 feet with rollator    Time  12    Period  Weeks    Status  Partially Met    Target Date  02/28/18      PT LONG TERM GOAL #3   Title  Patient will be able to ascend 1 step with min assist with LARD.    Baseline  01/03/18=max assist with HHA    Time  12    Period  Weeks    Status  On-going    Target Date  02/28/18      PT LONG TERM GOAL #4   Title  Patient (> 77 years old) will complete five times sit to stand test in < 15 seconds indicating an increased LE strength and improved balance.    Baseline  01/03/18= 57.45 sec with mod assist     Time  8    Period  Weeks    Status  New    Target Date  02/28/18      PT LONG TERM GOAL #5   Title  Patient will be able to perform sit to stand transfer with sBA from [facility]     Baseline  01/03/18 needs mod assist    Time  8    Period  Weeks    Status  New    Target Date  02/28/17  Plan - 01/24/18 1456    Clinical Impression Statement  Patient continues to have weakness in BLE and decreased mobility with transfers and ambulation with rW. She needs mod assist for sit to stand and mod assist for gait with rollator. She has new goals set for mobility and saftey and will continue to benefit from skilled PT to improve mobiltiy and saftey.    Rehab Potential  Good    PT Frequency  2x / week    PT Duration  12 weeks    PT Treatment/Interventions  Therapeutic exercise;Therapeutic activities;Functional  mobility training;Stair training;Balance training;Neuromuscular re-education;Patient/family education;Aquatic Therapy    PT Next Visit Plan  mobility training, strengthening, balanace training    PT Home Exercise Plan  LAQ, hip flex    Consulted and Agree with Plan of Care  Patient;Family member/caregiver       Patient will benefit from skilled therapeutic intervention in order to improve the following deficits and impairments:  Decreased balance, Decreased endurance, Decreased mobility, Decreased safety awareness, Decreased strength, Decreased activity tolerance  Visit Diagnosis: Muscle weakness (generalized)  Other lack of coordination  Difficulty in walking, not elsewhere classified  Parkinson's disease (HCC)  Pain and swelling of right lower extremity     Problem List Patient Active Problem List   Diagnosis Date Noted  . Pain and swelling of right lower extremity 01/02/2017  . Parkinson's disease (HCC) 01/02/2017    ,  S, PT DPT 01/24/2018, 2:58 PM  [facility] [facility] MAIN REHAB SERVICES [address] [place], [place], [address] Phone: [phone]   Fax:  [phone]  Name: Kelsey Owens MRN: [id number] Date of Birth: 06/03/1940   

## 2018-01-28 ENCOUNTER — Encounter: Payer: Self-pay | Admitting: Physical Therapy

## 2018-01-28 ENCOUNTER — Encounter: Payer: Self-pay | Admitting: Occupational Therapy

## 2018-01-28 ENCOUNTER — Ambulatory Visit: Payer: Medicare Other | Admitting: Occupational Therapy

## 2018-01-28 ENCOUNTER — Ambulatory Visit: Payer: Medicare Other | Admitting: Physical Therapy

## 2018-01-28 DIAGNOSIS — M6281 Muscle weakness (generalized): Secondary | ICD-10-CM | POA: Diagnosis not present

## 2018-01-28 DIAGNOSIS — R278 Other lack of coordination: Secondary | ICD-10-CM

## 2018-01-28 DIAGNOSIS — R262 Difficulty in walking, not elsewhere classified: Secondary | ICD-10-CM

## 2018-01-28 DIAGNOSIS — G20 Parkinson's disease: Secondary | ICD-10-CM

## 2018-01-28 NOTE — Therapy (Addendum)
[place] MAIN [facility] SERVICES [address] [place], [place], [address] Phone: [phone]   Fax:  [phone]  Physical Therapy Treatment  Patient Details  Name: Kelsey Owens MRN: [id number] Date of Birth: 1940/12/05 Referring Provider (PT): Jennelle Human   Encounter Date: 01/28/2018  PT End of Session - 01/28/18 1513    Visit Number  34    Number of Visits  50    Date for PT Re-Evaluation  02/21/18    Authorization Type  5/10    PT Start Time  0315    PT Stop Time  0400    PT Time Calculation (min)  45 min    Equipment Utilized During Treatment  Gait belt    Activity Tolerance  Patient tolerated treatment well    Behavior During Therapy  WFL for tasks assessed/performed       Past Medical History:  Diagnosis Date  . Arthritis   . Bursitis   . CAD (coronary artery disease)   . GERD (gastroesophageal reflux disease)   . Hyperlipidemia   . Hypertension   . Hypophonia   . Intercostal pain   . Parkinson disease ([place])   . Paroxysmal supraventricular tachycardia [facility])     Past Surgical History:  Procedure Laterality Date  . ABDOMINAL HYSTERECTOMY    . CHOLECYSTECTOMY    . DEEP BRAIN STIMULATOR PLACEMENT    . FLEXIBLE SIGMOIDOSCOPY N/A 12/19/2017   Procedure: FLEXIBLE SIGMOIDOSCOPY;  Surgeon: Manya Silvas, MD;  Location: [facility] ENDOSCOPY;  Service: Endoscopy;  Laterality: N/A;  . HEMORROIDECTOMY      There were no vitals filed for this visit.  Subjective Assessment - 01/28/18 1512    Subjective  Patient is not hurting today. She is doing better and her shoulder is hurting 4/10.    Patient is accompained by:  Family member    Pertinent History  Patient has had parkinsons disease for the last 20 years. Her mobility has been getting worse after she had a left radius fx.  She was walking with RW in 10/18.  Patient has worseing loss of balance beginning 10/18.  She had HHPT 2 x week for 2 months beginining after  hospitilization in Dec 2018.     Patient Stated Goals  Patient wants to be able to walk better and not fall as often    Currently in Pain?  No/denies    Pain Onset  More than a month ago       Therapeutic activities;  Transfer training sit to stand with max VC for sequencing and safety with RW and mod assist. Gait training with rollator with mod assist for assisting with pushing the AD  50 feet x 2.   Therapeutic exercise: Hooklying hip abd/ER x 20 hooklying bridging x 20  hiip abd supine x 20 sidelying hp abd with vc for safety and sequencing.                        PT Education - 01/29/18 0821    Education Details  HEP    Person(s) Educated  Patient    Methods  Explanation    Comprehension  Verbalized understanding       PT Short Term Goals - 10/25/17 1317      PT SHORT TERM GOAL #1   Title  Patient will be independent in home exercise program to improve strength/mobility for better functional independence with ADLs.    Time  4  Period  Weeks    Status  Achieved    Target Date  10/25/17      PT SHORT TERM GOAL #2   Title  Patient will perofrm sit to stand with correct hand placement and without support on the back of the chair with RW  and SBA.     Baseline  Patient needs min assist and needs min assist for correct hand placement.    Time  4    Period  Weeks    Status  Partially Met    Target Date  12/03/17      PT SHORT TERM GOAL #3   Title  Patient will stand for 1 minute with UE support to be able to perform safe transfers.     Baseline  Patient can stand for 10 seconds    Time  4    Period  Weeks    Status  On-going    Target Date  12/03/17        PT Long Term Goals - 01/03/18 1533      PT LONG TERM GOAL #1   Title  Patient will be able to ambulate in parallel bars x 10 feet with min assist.    Baseline  ; Patient is ambulating wiht rollator 25 feet and min assist,     Time  12    Period  Weeks    Status  Achieved    Target  Date  02/28/18      PT LONG TERM GOAL #2   Title  Patient will ambulate short distances ,25 feet, with RW and mod assist.     Baseline  : 08/22/17 patient is ambulating 15 feet with RW:09/12/17 25 feet with min assist and rollator, 10/25/17, 12/03/17 patient ambulates 100 feet with min assist and rollator, 01/03/18= mod assist for 100 feet with rollator    Time  12    Period  Weeks    Status  Partially Met    Target Date  02/28/18      PT LONG TERM GOAL #3   Title  Patient will be able to ascend 1 step with min assist with LARD.    Baseline  01/03/18=max assist with HHA    Time  12    Period  Weeks    Status  On-going    Target Date  02/28/18      PT LONG TERM GOAL #4   Title  Patient (> 38 years old) will complete five times sit to stand test in < 15 seconds indicating an increased LE strength and improved balance.    Baseline  01/03/18= 57.45 sec with mod assist     Time  8    Period  Weeks    Status  New    Target Date  02/28/18      PT LONG TERM GOAL #5   Title  Patient will be able to perform sit to stand transfer with sBA from [facility]     Baseline  01/03/18 needs mod assist    Time  8    Period  Weeks    Status  New    Target Date  02/28/17            Plan - 01/28/18 1515    Clinical Impression Statement  Pt presents shuffling feet during ambulation to parallel bars at start of session. Patient requires cues for upright posture and forward gaze with all exercises but tolerated all exercises well. Pt performed well with  cues to exaggerate motions with stepping activity. She will benefit from continued skilled PT interventions for improved posture, strength, balance, and QOL    Rehab Potential  Good    PT Frequency  2x / week    PT Duration  12 weeks    PT Treatment/Interventions  Therapeutic exercise;Therapeutic activities;Functional mobility training;Stair training;Balance training;Neuromuscular re-education;Patient/family education;Aquatic Therapy    PT Next Visit Plan   mobility training, strengthening, balanace training    PT Home Exercise Plan  LAQ, hip flex    Consulted and Agree with Plan of Care  Patient;Family member/caregiver       Patient will benefit from skilled therapeutic intervention in order to improve the following deficits and impairments:  Decreased balance, Decreased endurance, Decreased mobility, Decreased safety awareness, Decreased strength, Decreased activity tolerance  Visit Diagnosis: Muscle weakness (generalized)  Other lack of coordination  Difficulty in walking, not elsewhere classified  Parkinson's disease [facility])     Problem List Patient Active Problem List   Diagnosis Date Noted  . Pain and swelling of right lower extremity 01/02/2017  . Parkinson's disease ([place]) 01/02/2017    Alanson Puls, PT DPT 01/29/2018, 8:59 AM  [place] MAIN [facility] SERVICES [address] [place], [place], [address] Phone: [phone]   Fax:  [phone]  Name: Kelsey Owens MRN: [id number] Date of Birth: [birth year]

## 2018-01-28 NOTE — Therapy (Signed)
[place] MAIN [facility] SERVICES [address] [place], [place], [address] Phone: [phone]   Fax:  [phone]  Occupational Therapy Treatment  Patient Details  Name: Kelsey Owens MRN: [id number] Date of Birth: November 24, 1940 No data recorded  Encounter Date: 01/28/2018  OT End of Session - 01/28/18 1450    Visit Number  23    Number of Visits  24    Date for OT Re-Evaluation  02/04/18    Authorization Time Period  Visit 8 of 10 for progress report period starting 11/12/2017    OT Start Time  1435    OT Stop Time  1515    OT Time Calculation (min)  40 min    Activity Tolerance  Patient tolerated treatment well    Behavior During Therapy  [facility] for tasks assessed/performed       Past Medical History:  Diagnosis Date  . Arthritis   . Bursitis   . CAD (coronary artery disease)   . GERD (gastroesophageal reflux disease)   . Hyperlipidemia   . Hypertension   . Hypophonia   . Intercostal pain   . Parkinson disease ([place])   . Paroxysmal supraventricular tachycardia [facility])     Past Surgical History:  Procedure Laterality Date  . ABDOMINAL HYSTERECTOMY    . CHOLECYSTECTOMY    . DEEP BRAIN STIMULATOR PLACEMENT    . FLEXIBLE SIGMOIDOSCOPY N/A 12/19/2017   Procedure: FLEXIBLE SIGMOIDOSCOPY;  Surgeon: Manya Silvas, MD;  Location: [facility] ENDOSCOPY;  Service: Endoscopy;  Laterality: N/A;  . HEMORROIDECTOMY      There were no vitals filed for this visit.  Subjective Assessment - 01/28/18 1443    Patient is accompained by:  Family member    Pertinent History  Pt. is a 77 y.o. female with a history of Parkinsons Disease. Pt. has a Deep Brain Stimulator, and has recently had it adjusted. Pt. has had multiple falls, and has a history of multiple wrist fractures.     Patient Stated Goals  To be as independent as possible.     Currently in Pain?  No/denies      OT TREATMENT    Neuro muscular re-education:  Pt. Worked on [facility] skills  grasping, and manipulating 2" sticks, and placed them in a resistive foam board. Pt. Worked on using red resistive clips to remove the sticks. Pt. Required increased time, visual cues, and visual demonstration to complete.  Selfcare:  Pt. worked on Optician, dispensing with 50% legibility, with deviation diagonally above the line. Pt. required verbal, and visual cues for spacing.                        OT Education - 01/28/18 1445    Education Details  right hand [facility] skills    Person(s) Educated  Patient    Methods  Explanation;Demonstration    Comprehension  Verbalized understanding;Returned demonstration          OT Long Term Goals - 11/12/17 1441      OT LONG TERM GOAL #1   Title  Pt. will improve BUE strength by 2 muscle grades to improve ADL, and IADL functioning.    Baseline  Pt. conitnues to present with decreased UE strength    Time  12    Period  Weeks    Status  On-going    Target Date  02/04/18      OT LONG TERM GOAL #2   Title  Pt. will  increase Left grip strength by 5# to be able to open jars, and containers.    Baseline  Limited grip strength    Time  12    Period  Weeks    Status  On-going    Target Date  02/04/18      OT LONG TERM GOAL #3   Title  Pt. will improve left hand [facility] skills  to be able to button clothing efficiently    Baseline  Pt. has difficulty manipulating buttons efficiently.    Time  12    Period  Weeks    Status  On-going    Target Date  02/04/18      OT LONG TERM GOAL #4   Title  Pt. will independently write one sentence efficiently with 75% legibility.    Baseline  One 10 work sentence with 25% legibility in 56 sec. with deviation from the line.    Time  12    Period  Weeks    Status  On-going    Target Date  02/04/18      OT LONG TERM GOAL #5   Title  Pt. independently demonstrate work simplification strategies, and compensatory startegies for safe IADL tasks/home management tasks.       Baseline  Pt. continues  to have difficulty    Time  12    Period  Weeks    Status  On-going    Target Date  02/04/18      OT LONG TERM GOAL #6   Title  Pt. will independently demonstrate work simplification strategies, and compensatory startegies within the kitchen from w/c height.    Baseline  Continue    Time  12    Period  Weeks    Status  On-going    Target Date  02/04/18      OT LONG TERM GOAL #7   Title  Pt. will be able to complete toilet hygiene care with supervision.    Baseline  Pt. is unable to perform the task.    Time  12    Period  Weeks    Status  New    Target Date  02/04/18            Plan - 01/28/18 1452    Clinical Impression Statement Pt. is making steady progress, however presents with limited RUE strength, grip strength, pinch strength, and [place] skills. Pt. requires visual cues, and visual demonstration for all tasks, and writing as well as for task initiation. Pt. continues to have difficulty with writing legibility.  Pt. continues to work on improving overall right hand functional strength, and [place] skills in order to work on increasing meaningful engagement in ADL, and IADL tasks.     Occupational Profile and client history currently impacting functional performance  Pt. resides with her husband who is a Theme park manager. Pt. has grown children.    Occupational performance deficits (Please refer to evaluation for details):  ADL's;IADL's    Current Impairments/barriers affecting progress:  Positive Barriers: age, motivation, family support. Negative Barriers: multiple comorbidities, history of multiple falls.    OT Frequency  2x / week    OT Duration  12 weeks    OT Treatment/Interventions  Self-care/ADL training;Neuromuscular education;Therapeutic exercise;DME and/or AE instruction;Visual/perceptual remediation/compensation;Patient/family education;Therapeutic activities    Clinical Decision Making  Several treatment options, min-mod task modification necessary    Consulted and Agree with  Plan of Care  Patient       Patient will benefit from skilled therapeutic  intervention in order to improve the following deficits and impairments:  Abnormal gait, Decreased cognition, Pain, Decreased strength, Impaired UE functional use, Decreased activity tolerance, Decreased balance  Visit Diagnosis: Muscle weakness (generalized)  Other lack of coordination    Problem List Patient Active Problem List   Diagnosis Date Noted  . Pain and swelling of right lower extremity 01/02/2017  . Parkinson's disease ([place]) 01/02/2017    Harrel Carina, MS, OTR/L 01/28/2018, 3:07 PM  [place] MAIN [facility] SERVICES [address] [place], [place], [address] Phone: [phone]   Fax:  [phone]  Name: Kelsey Owens MRN: [id number] Date of Birth: [birth year]

## 2018-01-29 ENCOUNTER — Encounter: Payer: Self-pay | Admitting: Physical Therapy

## 2018-01-30 ENCOUNTER — Ambulatory Visit: Payer: Medicare Other | Admitting: Physical Therapy

## 2018-01-30 ENCOUNTER — Encounter: Payer: Self-pay | Admitting: Occupational Therapy

## 2018-01-30 ENCOUNTER — Encounter: Payer: Self-pay | Admitting: Physical Therapy

## 2018-01-30 ENCOUNTER — Ambulatory Visit: Payer: Medicare Other | Admitting: Occupational Therapy

## 2018-01-30 DIAGNOSIS — M6281 Muscle weakness (generalized): Secondary | ICD-10-CM | POA: Diagnosis not present

## 2018-01-30 DIAGNOSIS — R278 Other lack of coordination: Secondary | ICD-10-CM

## 2018-01-30 DIAGNOSIS — R262 Difficulty in walking, not elsewhere classified: Secondary | ICD-10-CM

## 2018-01-30 DIAGNOSIS — G20 Parkinson's disease: Secondary | ICD-10-CM

## 2018-01-30 NOTE — Therapy (Signed)
[place] MAIN [facility] SERVICES [address] [place], [place], [address] Phone: [phone]   Fax:  [phone]  Physical Therapy Treatment  Patient Details  Name: Kelsey Owens MRN: [id number] Date of Birth: 03-Sep-1940 Referring Provider (PT): Jennelle Human   Encounter Date: 01/30/2018  PT End of Session - 01/30/18 1503    Visit Number  35    Number of Visits  50    Date for PT Re-Evaluation  02/21/18    Authorization Type  6/10    PT Start Time  0230    PT Stop Time  0312    PT Time Calculation (min)  42 min    Equipment Utilized During Treatment  Gait belt    Activity Tolerance  Patient tolerated treatment well    Behavior During Therapy  WFL for tasks assessed/performed       Past Medical History:  Diagnosis Date  . Arthritis   . Bursitis   . CAD (coronary artery disease)   . GERD (gastroesophageal reflux disease)   . Hyperlipidemia   . Hypertension   . Hypophonia   . Intercostal pain   . Parkinson disease ([place])   . Paroxysmal supraventricular tachycardia [facility])     Past Surgical History:  Procedure Laterality Date  . ABDOMINAL HYSTERECTOMY    . CHOLECYSTECTOMY    . DEEP BRAIN STIMULATOR PLACEMENT    . FLEXIBLE SIGMOIDOSCOPY N/A 12/19/2017   Procedure: FLEXIBLE SIGMOIDOSCOPY;  Surgeon: Manya Silvas, MD;  Location: [facility] ENDOSCOPY;  Service: Endoscopy;  Laterality: N/A;  . HEMORROIDECTOMY      There were no vitals filed for this visit.  Subjective Assessment - 01/30/18 1501    Subjective  Patient fell down getting into the house yesterday. she says her right shoulder is hurting a bit today.     Patient is accompained by:  Family member    Pertinent History  Patient has had parkinsons disease for the last 20 years. Her mobility has been getting worse after she had a left radius fx.  She was walking with RW in 10/18.  Patient has worseing loss of balance beginning 10/18.  She had HHPT 2 x week for 2 months  beginining after hospitilization in Dec 2018.     Patient Stated Goals  Patient wants to be able to walk better and not fall as often    Currently in Pain?  Yes    Pain Score  2     Pain Location  Shoulder    Pain Orientation  Right    Pain Descriptors / Indicators  Aching    Pain Onset  More than a month ago    Aggravating Factors   lifting, raising, pushing, pulling    Pain Relieving Factors  ice, heat    Effect of Pain on Daily Activities  no effect    Multiple Pain Sites  No       Treatment: Warm up nu-step x 3 mins  Transfer training from various heights with mod assist and vc for correct technique and sequencing. X 5  Gait training with rollator 100 feet x 3 with cues for taking longer steps, bigger steps and for controlling the direction of the assistive device., and for mod assist to propel the AD and assist for better upright posture.  Reviewed HEP and safety with equipment Patient has freezing episodes and difficulty with getting her feet to pick up and move side ways for backing up.  PT Education - 01/30/18 1502    Education Details  HEP    Person(s) Educated  Patient    Methods  Explanation    Comprehension  Verbalized understanding       PT Short Term Goals - 10/25/17 1317      PT SHORT TERM GOAL #1   Title  Patient will be independent in home exercise program to improve strength/mobility for better functional independence with ADLs.    Time  4    Period  Weeks    Status  Achieved    Target Date  10/25/17      PT SHORT TERM GOAL #2   Title  Patient will perofrm sit to stand with correct hand placement and without support on the back of the chair with RW  and SBA.     Baseline  Patient needs min assist and needs min assist for correct hand placement.    Time  4    Period  Weeks    Status  Partially Met    Target Date  12/03/17      PT SHORT TERM GOAL #3   Title  Patient will stand for 1 minute with UE support to be  able to perform safe transfers.     Baseline  Patient can stand for 10 seconds    Time  4    Period  Weeks    Status  On-going    Target Date  12/03/17        PT Long Term Goals - 01/03/18 1533      PT LONG TERM GOAL #1   Title  Patient will be able to ambulate in parallel bars x 10 feet with min assist.    Baseline  ; Patient is ambulating wiht rollator 25 feet and min assist,     Time  12    Period  Weeks    Status  Achieved    Target Date  02/28/18      PT LONG TERM GOAL #2   Title  Patient will ambulate short distances ,25 feet, with RW and mod assist.     Baseline  : 08/22/17 patient is ambulating 15 feet with RW:09/12/17 25 feet with min assist and rollator, 10/25/17, 12/03/17 patient ambulates 100 feet with min assist and rollator, 01/03/18= mod assist for 100 feet with rollator    Time  12    Period  Weeks    Status  Partially Met    Target Date  02/28/18      PT LONG TERM GOAL #3   Title  Patient will be able to ascend 1 step with min assist with LARD.    Baseline  01/03/18=max assist with HHA    Time  12    Period  Weeks    Status  On-going    Target Date  02/28/18      PT LONG TERM GOAL #4   Title  Patient (> 77 years old) will complete five times sit to stand test in < 15 seconds indicating an increased LE strength and improved balance.    Baseline  01/03/18= 57.45 sec with mod assist     Time  8    Period  Weeks    Status  New    Target Date  02/28/18      PT LONG TERM GOAL #5   Title  Patient will be able to perform sit to stand transfer with sBA from [facility]     Baseline  01/03/18 needs mod assist    Time  8    Period  Weeks    Status  New    Target Date  02/28/17            Plan - 01/30/18 1503    Clinical Impression Statement  Patient performs transfer training sit to stand with rollator and mod assist for sequencing and safety. She ambulates with rollator and mod assist for navigation of the AD and for balance assist; she needs cues for posture  and taking longer steps. She will continue to benefit from skilled PT to improve mobiltiy and saftey.     Rehab Potential  Good    PT Frequency  2x / week    PT Duration  12 weeks    PT Treatment/Interventions  Therapeutic exercise;Therapeutic activities;Functional mobility training;Stair training;Balance training;Neuromuscular re-education;Patient/family education;Aquatic Therapy    PT Next Visit Plan  mobility training, strengthening, balanace training    PT Home Exercise Plan  LAQ, hip flex    Consulted and Agree with Plan of Care  Patient;Family member/caregiver       Patient will benefit from skilled therapeutic intervention in order to improve the following deficits and impairments:  Decreased balance, Decreased endurance, Decreased mobility, Decreased safety awareness, Decreased strength, Decreased activity tolerance  Visit Diagnosis: Muscle weakness (generalized)  Other lack of coordination  Difficulty in walking, not elsewhere classified  Parkinson's disease [facility])     Problem List Patient Active Problem List   Diagnosis Date Noted  . Pain and swelling of right lower extremity 01/02/2017  . Parkinson's disease ([place]) 01/02/2017    Alanson Puls, PT DPT 01/30/2018, 3:07 PM  [place] MAIN [facility] SERVICES [address] [place], [place], [address] Phone: [phone]   Fax:  [phone]  Name: Kelsey Owens MRN: [id number] Date of Birth: 09-02-1940

## 2018-01-30 NOTE — Therapy (Addendum)
[place] MAIN [facility] SERVICES [address] [place], [place], [address] Phone: [phone]   Fax:  [phone]  Occupational Therapy Treatment/Recertification Note/Occupational Therapy Progress Note  Dates of reporting period  11/12/2017   to   01/30/2018  Patient Details  Name: Kelsey Owens MRN: [id number] Date of Birth: 01/04/1941 No data recorded  Encounter Date: 01/30/2018  OT End of Session - 01/30/18 1409    Visit Number  24    Number of Visits  17    Date for OT Re-Evaluation  04/24/18    Authorization Time Period  Visit 9 of 10 for progress report period starting 11/12/2017    OT Start Time  1402    OT Stop Time  1430    OT Time Calculation (min)  28 min    Activity Tolerance  Patient tolerated treatment well    Behavior During Therapy  [facility] for tasks assessed/performed       Past Medical History:  Diagnosis Date  . Arthritis   . Bursitis   . CAD (coronary artery disease)   . GERD (gastroesophageal reflux disease)   . Hyperlipidemia   . Hypertension   . Hypophonia   . Intercostal pain   . Parkinson disease ([place])   . Paroxysmal supraventricular tachycardia [facility])     Past Surgical History:  Procedure Laterality Date  . ABDOMINAL HYSTERECTOMY    . CHOLECYSTECTOMY    . DEEP BRAIN STIMULATOR PLACEMENT    . FLEXIBLE SIGMOIDOSCOPY N/A 12/19/2017   Procedure: FLEXIBLE SIGMOIDOSCOPY;  Surgeon: Manya Silvas, MD;  Location: [facility] ENDOSCOPY;  Service: Endoscopy;  Laterality: N/A;  . HEMORROIDECTOMY      There were no vitals filed for this visit.  Subjective Assessment - 01/30/18 1408    Subjective   Pt. was late for the session.    Patient is accompained by:  Family member    Pertinent History  Pt. is a 77 y.o. female with a history of Parkinsons Disease. Pt. has a Deep Brain Stimulator, and has recently had it adjusted. Pt. has had multiple falls, and has a history of multiple wrist fractures.     Patient Stated Goals   To be as independent as possible.     Currently in Pain?  No/denies         [facility] OT Assessment - 01/30/18 1415      Coordination   Right 9 Hole Peg Test  47    Left 9 Hole Peg Test  51      Strength   Overall Strength Comments  LUE: 4/5, RUE: 4-/ shoulder flexion, abduction, 4+/5 elbow flexion, extension      Hand Function   Right Hand Grip (lbs)  36    Right Hand Lateral Pinch  12 lbs    Right Hand 3 Point Pinch  11 lbs    Left Hand Grip (lbs)  30    Left Hand Lateral Pinch  12 lbs    Left 3 point pinch  13 lbs      OT TREATMENT    Measurements were obtained, and goals were reviewed.  Neuro muscular re-education:  Pt. worked on grasping, and positioning magnetic hooks on a whiteboard positioned at a vertical angle, on a high surface on a tabletop surface. Pt. worked on [facility] skills grasping 1/2" flat washers, and placing them on the hooks.  OT Education - 01/30/18 1408    Education Details  RUE ROM, and [place] skills    Person(s) Educated  Patient    Methods  Explanation;Demonstration    Comprehension  Verbalized understanding;Returned demonstration          OT Long Term Goals - 01/30/18 1425      OT LONG TERM GOAL #1   Title  Pt. will improve BUE strength by 2 muscle grades to improve ADL, and IADL functioning.    Baseline  01/30/2018: Pt. conitnues to present with weakness in BUEs    Time  12    Period  Weeks    Status  On-going    Target Date  04/24/18      OT LONG TERM GOAL #2   Title  Pt. will increase Left grip strength by 5# to be able to open jars, and containers.    Baseline  01/30/2018: Pt. is now able toopen jars, and containers.    Time  12    Period  Weeks    Status  Partially Met    Target Date  04/24/18      OT LONG TERM GOAL #3   Title  Pt. will improve left hand [facility] skills  to be able to button clothing efficiently    Baseline  01/30/2018: Pt. has difficulty manipulating buttons efficiently.    Time   12    Period  Weeks    Status  On-going    Target Date  04/24/18      OT LONG TERM GOAL #4   Title  Pt. will independently write one sentence efficiently with 75% legibility.    Baseline  01/30/2018: Pt. conitnues to present with decreased writing legibility.    Time  12    Period  Weeks    Status  On-going    Target Date  04/24/18      OT LONG TERM GOAL #5   Title  Pt. independently demonstrate work simplification strategies, and compensatory startegies for safe IADL tasks/home management tasks.         OT LONG TERM GOAL #6   Title  Pt. will independently demonstrate work simplification strategies, and compensatory startegies within the kitchen from w/c height.    Baseline  Continue    Time  12    Period  Weeks    Status  On-going    Target Date  04/24/18      OT LONG TERM GOAL #7   Title  Pt. will be able to complete toilet hygiene care with supervision.    Baseline  Pt. is unable to perform the task.    Time  12    Period  Weeks    Status  On-going    Target Date  04/24/18            Plan - 01/30/18 1410    Clinical Impression Statement Pt. making progress with grip strength, and [place] skills. Pt. is now able to open containers and jars, and has make excellent progress with left hand [place] measurements. Pt. has difficulty managing toileting care needs, and writing legibility. Pt. continues to work on improving UE strength, and [facility] skills in order to improve ADL, and IADL functioning. Pt. goals were reviewed with the patient.    Occupational Profile and client history currently impacting functional performance  Pt. resides with her husband who is a Theme park manager. Pt. has grown children.    Occupational performance deficits (Please refer to evaluation for details):  ADL's;IADL's    Current Impairments/barriers affecting progress:  Positive Barriers: age, motivation, family support. Negative Barriers: multiple comorbidities, history of multiple falls.    OT Frequency  2x / week     OT Duration  12 weeks    OT Treatment/Interventions  Self-care/ADL training;Neuromuscular education;Therapeutic exercise;DME and/or AE instruction;Visual/perceptual remediation/compensation;Patient/family education;Therapeutic activities    Clinical Decision Making  Several treatment options, min-mod task modification necessary    Consulted and Agree with Plan of Care  Patient       Patient will benefit from skilled therapeutic intervention in order to improve the following deficits and impairments:  Abnormal gait, Decreased cognition, Pain, Decreased strength, Impaired UE functional use, Decreased activity tolerance, Decreased balance  Visit Diagnosis: Muscle weakness (generalized)  Other lack of coordination    Problem List Patient Active Problem List   Diagnosis Date Noted  . Pain and swelling of right lower extremity 01/02/2017  . Parkinson's disease ([place]) 01/02/2017    Harrel Carina, MS, OTR/L 01/30/2018, 3:46 PM  [place] MAIN [facility] SERVICES [address] [place], [place], [address] Phone: [phone]   Fax:  [phone]  Name: Kelsey Owens MRN: [id number] Date of Birth: 11-13-1940

## 2018-01-30 NOTE — Addendum Note (Signed)
Addended by: Lucia Bitter on: 01/30/2018 03:53 PM   Modules accepted: Orders

## 2018-02-04 ENCOUNTER — Encounter: Payer: Self-pay | Admitting: Physical Therapy

## 2018-02-04 ENCOUNTER — Ambulatory Visit: Payer: Medicare Other | Admitting: Physical Therapy

## 2018-02-04 ENCOUNTER — Ambulatory Visit: Payer: Medicare Other | Admitting: Occupational Therapy

## 2018-02-04 DIAGNOSIS — R278 Other lack of coordination: Secondary | ICD-10-CM

## 2018-02-04 DIAGNOSIS — G20 Parkinson's disease: Secondary | ICD-10-CM

## 2018-02-04 DIAGNOSIS — R262 Difficulty in walking, not elsewhere classified: Secondary | ICD-10-CM

## 2018-02-04 DIAGNOSIS — M6281 Muscle weakness (generalized): Secondary | ICD-10-CM

## 2018-02-04 NOTE — Therapy (Signed)
[place] MAIN [facility] SERVICES [address] [place], [place], [address] Phone: [phone]   Fax:  [phone]  Occupational Therapy Treatment  Patient Details  Name: Kelsey Owens MRN: [id number] Date of Birth: [birth year] No data recorded  Encounter Date: 02/04/2018  OT End of Session - 02/04/18 1501    Visit Number  25    Number of Visits  48    Date for OT Re-Evaluation  04/24/18    Authorization Time Period  Visit 1 of 10 for progress report period starting 02/04/2018    OT Start Time  1430    OT Stop Time  1515    OT Time Calculation (min)  45 min    Activity Tolerance  Patient tolerated treatment well    Behavior During Therapy  [facility] for tasks assessed/performed       Past Medical History:  Diagnosis Date  . Arthritis   . Bursitis   . CAD (coronary artery disease)   . GERD (gastroesophageal reflux disease)   . Hyperlipidemia   . Hypertension   . Hypophonia   . Intercostal pain   . Parkinson disease ([place])   . Paroxysmal supraventricular tachycardia [facility])     Past Surgical History:  Procedure Laterality Date  . ABDOMINAL HYSTERECTOMY    . CHOLECYSTECTOMY    . DEEP BRAIN STIMULATOR PLACEMENT    . FLEXIBLE SIGMOIDOSCOPY N/A 12/19/2017   Procedure: FLEXIBLE SIGMOIDOSCOPY;  Surgeon: Manya Silvas, MD;  Location: [facility] ENDOSCOPY;  Service: Endoscopy;  Laterality: N/A;  . HEMORROIDECTOMY      There were no vitals filed for this visit.  Subjective Assessment - 02/04/18 1500    Subjective   Pt. reports she is ready for the holiday.    Patient is accompained by:  Family member    Pertinent History  Pt. is a 77 y.o. female with a history of Parkinsons Disease. Pt. has a Deep Brain Stimulator, and has recently had it adjusted. Pt. has had multiple falls, and has a history of multiple wrist fractures.     Patient Stated Goals  To be as independent as possible.     Currently in Pain?  No/denies      OT TREATMENT    Neuro  muscular re-education:  Pt. performed [facility] skills training to improve speed and dexterity needed for ADL tasks and writing. Pt. demonstrated grasping 1 inch sticks,  inch cylindrical collars, and  inch flat washers on the [place] pegboard, and constructing towers. Pt. requires cues for removing the [place] object, and returning them to the appropriate dish.                         OT Education - 02/04/18 1501    Education Details  RUE ROM, and [place] skills    Person(s) Educated  Patient    Methods  Explanation;Demonstration    Comprehension  Verbalized understanding;Returned demonstration          OT Long Term Goals - 01/30/18 1425      OT LONG TERM GOAL #1   Title  Pt. will improve BUE strength by 2 muscle grades to improve ADL, and IADL functioning.    Baseline  01/30/2018: Pt. conitnues to present with weakness in BUEs    Time  12    Period  Weeks    Status  On-going    Target Date  04/24/18      OT LONG TERM GOAL #2  Title  Pt. will increase Left grip strength by 5# to be able to open jars, and containers.    Baseline  01/30/2018: Pt. is now able toopen jars, and containers.    Time  12    Period  Weeks    Status  Partially Met    Target Date  04/24/18      OT LONG TERM GOAL #3   Title  Pt. will improve left hand [facility] skills  to be able to button clothing efficiently    Baseline  01/30/2018: Pt. has difficulty manipulating buttons efficiently.    Time  12    Period  Weeks    Status  On-going    Target Date  04/24/18      OT LONG TERM GOAL #4   Title  Pt. will independently write one sentence efficiently with 75% legibility.    Baseline  01/30/2018: Pt. conitnues to present with decreased writing legibility.    Time  12    Period  Weeks    Status  On-going    Target Date  04/24/18      OT LONG TERM GOAL #5   Title  Pt. independently demonstrate work simplification strategies, and compensatory startegies for safe IADL tasks/home management tasks.          OT LONG TERM GOAL #6   Title  Pt. will independently demonstrate work simplification strategies, and compensatory startegies within the kitchen from w/c height.    Baseline  Continue    Time  12    Period  Weeks    Status  On-going    Target Date  04/24/18      OT LONG TERM GOAL #7   Title  Pt. will be able to complete toilet hygiene care with supervision.    Baseline  Pt. is unable to perform the task.    Time  12    Period  Weeks    Status  On-going    Target Date  04/24/18            Plan - 02/04/18 1502    Clinical Impression Statement  Pt. conitnues to make progress with grip strength, and [place] skills.  Pt. continues to have difficulty with performing toileting care needs, and writing legibility. Pt. continues to work on improving UE strength, and [facility] skills in order to improve these ADL, and IADL functioning tasks..     Occupational Profile and client history currently impacting functional performance  Pt. resides with her husband who is a Theme park manager. Pt. has grown children.    Occupational performance deficits (Please refer to evaluation for details):  ADL's;IADL's    Current Impairments/barriers affecting progress:  Positive Barriers: age, motivation, family support. Negative Barriers: multiple comorbidities, history of multiple falls.    OT Frequency  2x / week    OT Duration  12 weeks    OT Treatment/Interventions  Self-care/ADL training;Neuromuscular education;Therapeutic exercise;DME and/or AE instruction;Visual/perceptual remediation/compensation;Patient/family education;Therapeutic activities    Clinical Decision Making  Several treatment options, min-mod task modification necessary    Consulted and Agree with Plan of Care  Patient       Patient will benefit from skilled therapeutic intervention in order to improve the following deficits and impairments:  Abnormal gait, Decreased cognition, Pain, Decreased strength, Impaired UE functional use, Decreased activity  tolerance, Decreased balance  Visit Diagnosis: Muscle weakness (generalized)  Other lack of coordination    Problem List Patient Active Problem List   Diagnosis Date Noted  .  Pain and swelling of right lower extremity 01/02/2017  . Parkinson's disease ([place]) 01/02/2017    Harrel Carina 02/04/2018, 3:08 PM  [place] MAIN [facility] SERVICES [address] [place], [place], [address] Phone: [phone]   Fax:  [phone]  Name: Kelsey Owens MRN: [id number] Date of Birth: 1940-08-12

## 2018-02-04 NOTE — Therapy (Signed)
[place] MAIN [facility] SERVICES [address] [place], [place], [address] Phone: [phone]   Fax:  [phone]  Physical Therapy Treatment  Patient Details  Name: Kelsey Owens MRN: [id number] Date of Birth: Jun 26, 1940 Referring Provider (PT): Jennelle Human   Encounter Date: 02/04/2018  PT End of Session - 02/04/18 1402    Visit Number  36    Number of Visits  50    Date for PT Re-Evaluation  02/21/18    Authorization Type  7/10    PT Start Time  0145    PT Stop Time  0230    PT Time Calculation (min)  45 min    Equipment Utilized During Treatment  Gait belt    Activity Tolerance  Patient tolerated treatment well    Behavior During Therapy  WFL for tasks assessed/performed       Past Medical History:  Diagnosis Date  . Arthritis   . Bursitis   . CAD (coronary artery disease)   . GERD (gastroesophageal reflux disease)   . Hyperlipidemia   . Hypertension   . Hypophonia   . Intercostal pain   . Parkinson disease ([place])   . Paroxysmal supraventricular tachycardia [facility])     Past Surgical History:  Procedure Laterality Date  . ABDOMINAL HYSTERECTOMY    . CHOLECYSTECTOMY    . DEEP BRAIN STIMULATOR PLACEMENT    . FLEXIBLE SIGMOIDOSCOPY N/A 12/19/2017   Procedure: FLEXIBLE SIGMOIDOSCOPY;  Surgeon: Manya Silvas, MD;  Location: [facility] ENDOSCOPY;  Service: Endoscopy;  Laterality: N/A;  . HEMORROIDECTOMY      There were no vitals filed for this visit.  Subjective Assessment - 02/04/18 1402    Subjective  Patient is doing well today, no reports of pain    Patient is accompained by:  Family member    Pertinent History  Patient has had parkinsons disease for the last 20 years. Her mobility has been getting worse after she had a left radius fx.  She was walking with RW in 10/18.  Patient has worseing loss of balance beginning 10/18.  She had HHPT 2 x week for 2 months beginining after hospitilization in Dec 2018.     Patient Stated  Goals  Patient wants to be able to walk better and not fall as often    Currently in Pain?  No/denies    Pain Score  0-No pain    Pain Onset  More than a month ago       Therapeutic activities: Nu-step x 5 mins  Patient needs mod assist for transfers sit to stand with vc for sequencing and tactile cues for hand placement Patient performs 5 times sit to stand with mod assist with UE support  Gait training: Patient ambulates with rollator and mod assist for assist to push the rollator and mod assist for posterior loss of balance, 100 feet x 3 with seated rest x 3 CGA and Min to mod verbal cues used throughout with increased in postural sway and LOB most seen with narrow base of support and while on uneven surfaces. Continues to have balance deficits typical with diagnosis. Patient performs intermediate level exercises without pain behaviors and needs verbal cuing for postural alignment and head positioning                        PT Education - 02/04/18 1402    Education Details  HEP    Person(s) Educated  Patient  Methods  Explanation    Comprehension  Verbalized understanding       PT Short Term Goals - 10/25/17 1317      PT SHORT TERM GOAL #1   Title  Patient will be independent in home exercise program to improve strength/mobility for better functional independence with ADLs.    Time  4    Period  Weeks    Status  Achieved    Target Date  10/25/17      PT SHORT TERM GOAL #2   Title  Patient will perofrm sit to stand with correct hand placement and without support on the back of the chair with RW  and SBA.     Baseline  Patient needs min assist and needs min assist for correct hand placement.    Time  4    Period  Weeks    Status  Partially Met    Target Date  12/03/17      PT SHORT TERM GOAL #3   Title  Patient will stand for 1 minute with UE support to be able to perform safe transfers.     Baseline  Patient can stand for 10 seconds    Time  4     Period  Weeks    Status  On-going    Target Date  12/03/17        PT Long Term Goals - 01/03/18 1533      PT LONG TERM GOAL #1   Title  Patient will be able to ambulate in parallel bars x 10 feet with min assist.    Baseline  ; Patient is ambulating wiht rollator 25 feet and min assist,     Time  12    Period  Weeks    Status  Achieved    Target Date  02/28/18      PT LONG TERM GOAL #2   Title  Patient will ambulate short distances ,25 feet, with RW and mod assist.     Baseline  : 08/22/17 patient is ambulating 15 feet with RW:09/12/17 25 feet with min assist and rollator, 10/25/17, 12/03/17 patient ambulates 100 feet with min assist and rollator, 01/03/18= mod assist for 100 feet with rollator    Time  12    Period  Weeks    Status  Partially Met    Target Date  02/28/18      PT LONG TERM GOAL #3   Title  Patient will be able to ascend 1 step with min assist with LARD.    Baseline  01/03/18=max assist with HHA    Time  12    Period  Weeks    Status  On-going    Target Date  02/28/18      PT LONG TERM GOAL #4   Title  Patient (> 48 years old) will complete five times sit to stand test in < 15 seconds indicating an increased LE strength and improved balance.    Baseline  01/03/18= 57.45 sec with mod assist     Time  8    Period  Weeks    Status  New    Target Date  02/28/18      PT LONG TERM GOAL #5   Title  Patient will be able to perform sit to stand transfer with sBA from [facility]     Baseline  01/03/18 needs mod assist    Time  8    Period  Weeks    Status  New  Target Date  02/28/17            Plan - 02/04/18 1403    Clinical Impression Statement  Patient demonstrates understanding of HEP with min corrections needed. Patient challenged closed chain and open chain exercises with multiple repetitions due to fatigue. Weak LE combined with weak core musculature results in fair postural control and  gait instability.    Rehab Potential  Good    PT Frequency  2x /  week    PT Duration  12 weeks    PT Treatment/Interventions  Therapeutic exercise;Therapeutic activities;Functional mobility training;Stair training;Balance training;Neuromuscular re-education;Patient/family education;Aquatic Therapy    PT Next Visit Plan  mobility training, strengthening, balanace training    PT Home Exercise Plan  LAQ, hip flex    Consulted and Agree with Plan of Care  Patient;Family member/caregiver       Patient will benefit from skilled therapeutic intervention in order to improve the following deficits and impairments:  Decreased balance, Decreased endurance, Decreased mobility, Decreased safety awareness, Decreased strength, Decreased activity tolerance  Visit Diagnosis: Muscle weakness (generalized)  Other lack of coordination  Difficulty in walking, not elsewhere classified  Parkinson's disease [facility])     Problem List Patient Active Problem List   Diagnosis Date Noted  . Pain and swelling of right lower extremity 01/02/2017  . Parkinson's disease ([place]) 01/02/2017    Alanson Puls, PT DPT 02/04/2018, 2:20 PM  [place] MAIN [facility] SERVICES [address] [place], [place], [address] Phone: [phone]   Fax:  [phone]  Name: Kelsey Owens MRN: [id number] Date of Birth: Apr 10, 1940

## 2018-02-07 ENCOUNTER — Ambulatory Visit: Payer: Medicare Other | Admitting: Physical Therapy

## 2018-02-11 ENCOUNTER — Encounter: Payer: Self-pay | Admitting: Physical Therapy

## 2018-02-11 ENCOUNTER — Ambulatory Visit: Payer: Medicare Other | Admitting: Physical Therapy

## 2018-02-11 ENCOUNTER — Ambulatory Visit: Payer: Medicare Other | Admitting: Occupational Therapy

## 2018-02-11 ENCOUNTER — Encounter: Payer: Self-pay | Admitting: Occupational Therapy

## 2018-02-11 DIAGNOSIS — R262 Difficulty in walking, not elsewhere classified: Secondary | ICD-10-CM

## 2018-02-11 DIAGNOSIS — M6281 Muscle weakness (generalized): Secondary | ICD-10-CM

## 2018-02-11 DIAGNOSIS — R278 Other lack of coordination: Secondary | ICD-10-CM

## 2018-02-11 NOTE — Therapy (Signed)
[place] MAIN [facility] SERVICES [address] [place], [place], [address] Phone: [phone]   Fax:  [phone]  Physical Therapy Treatment  Patient Details  Name: Kelsey Owens MRN: [id number] Date of Birth: 1941-01-03 Referring Provider (PT): Jennelle Human   Encounter Date: 02/11/2018  PT End of Session - 02/11/18 1333    Visit Number  37    Number of Visits  50    Date for PT Re-Evaluation  02/21/18    Authorization Type  8/10    PT Start Time  0103    PT Stop Time  0145    PT Time Calculation (min)  42 min    Equipment Utilized During Treatment  Gait belt    Activity Tolerance  Patient tolerated treatment well    Behavior During Therapy  WFL for tasks assessed/performed       Past Medical History:  Diagnosis Date  . Arthritis   . Bursitis   . CAD (coronary artery disease)   . GERD (gastroesophageal reflux disease)   . Hyperlipidemia   . Hypertension   . Hypophonia   . Intercostal pain   . Parkinson disease ([place])   . Paroxysmal supraventricular tachycardia [facility])     Past Surgical History:  Procedure Laterality Date  . ABDOMINAL HYSTERECTOMY    . CHOLECYSTECTOMY    . DEEP BRAIN STIMULATOR PLACEMENT    . FLEXIBLE SIGMOIDOSCOPY N/A 12/19/2017   Procedure: FLEXIBLE SIGMOIDOSCOPY;  Surgeon: Manya Silvas, MD;  Location: [facility] ENDOSCOPY;  Service: Endoscopy;  Laterality: N/A;  . HEMORROIDECTOMY      There were no vitals filed for this visit.  Subjective Assessment - 02/11/18 1332    Subjective  Patient is doing well today, no reports of pain    Patient is accompained by:  Family member    Pertinent History  Patient has had parkinsons disease for the last 20 years. Her mobility has been getting worse after she had a left radius fx.  She was walking with RW in 10/18.  Patient has worseing loss of balance beginning 10/18.  She had HHPT 2 x week for 2 months beginining after hospitilization in Dec 2018.     Patient Stated  Goals  Patient wants to be able to walk better and not fall as often    Currently in Pain?  Yes    Pain Score  5     Pain Location  Hip    Pain Orientation  Right    Pain Descriptors / Indicators  Aching    Pain Onset  More than a month ago      Treatment:  Therapeutic activities: Patient needs mod assist for transfers sit to stand with vc for sequencing and tactile cues for hand placement  Gait training: Patient ambulates with rollator 100 feet x 2  and mod assist for assist to push the rollator and mod assist for posterior loss of balance Patient has starts and stops and difficulty with coordinated stepping but posture is improving.  Therapeutic exercise: Patient performs 5 times sit to stand with mod assist with UE support Patient performs seated LAQ with 2 lbs x 10 x 2 Patient performs seated hip abd/ER with YTB x 20  Side lying hip abd x 10 x 2 BLE Clam in sidelying x 10 x 2 BLE Patient is instructed in HEP for sit to stand  Patient has decreased coordination and motor control with exercises.  PT Education - 02/11/18 1333    Education Details  HEP    Person(s) Educated  Patient    Methods  Explanation    Comprehension  Verbalized understanding;Need further instruction       PT Short Term Goals - 10/25/17 1317      PT SHORT TERM GOAL #1   Title  Patient will be independent in home exercise program to improve strength/mobility for better functional independence with ADLs.    Time  4    Period  Weeks    Status  Achieved    Target Date  10/25/17      PT SHORT TERM GOAL #2   Title  Patient will perofrm sit to stand with correct hand placement and without support on the back of the chair with RW  and SBA.     Baseline  Patient needs min assist and needs min assist for correct hand placement.    Time  4    Period  Weeks    Status  Partially Met    Target Date  12/03/17      PT SHORT TERM GOAL #3   Title  Patient will stand  for 1 minute with UE support to be able to perform safe transfers.     Baseline  Patient can stand for 10 seconds    Time  4    Period  Weeks    Status  On-going    Target Date  12/03/17        PT Long Term Goals - 01/03/18 1533      PT LONG TERM GOAL #1   Title  Patient will be able to ambulate in parallel bars x 10 feet with min assist.    Baseline  ; Patient is ambulating wiht rollator 25 feet and min assist,     Time  12    Period  Weeks    Status  Achieved    Target Date  02/28/18      PT LONG TERM GOAL #2   Title  Patient will ambulate short distances ,25 feet, with RW and mod assist.     Baseline  : 08/22/17 patient is ambulating 15 feet with RW:09/12/17 25 feet with min assist and rollator, 10/25/17, 12/03/17 patient ambulates 100 feet with min assist and rollator, 01/03/18= mod assist for 100 feet with rollator    Time  12    Period  Weeks    Status  Partially Met    Target Date  02/28/18      PT LONG TERM GOAL #3   Title  Patient will be able to ascend 1 step with min assist with LARD.    Baseline  01/03/18=max assist with HHA    Time  12    Period  Weeks    Status  On-going    Target Date  02/28/18      PT LONG TERM GOAL #4   Title  Patient (> 6 years old) will complete five times sit to stand test in < 15 seconds indicating an increased LE strength and improved balance.    Baseline  01/03/18= 57.45 sec with mod assist     Time  8    Period  Weeks    Status  New    Target Date  02/28/18      PT LONG TERM GOAL #5   Title  Patient will be able to perform sit to stand transfer with sBA from [facility]  Baseline  01/03/18 needs mod assist    Time  8    Period  Weeks    Status  New    Target Date  02/28/17            Plan - 02/11/18 1334    Clinical Impression Statement  Pt requires direction and verbal cues for correct performance of exercises. Patient demonstrates improving strength in BLE and performs open and closed chain exercises and reports no of  pain. Pt was able to perform all exercises with min VC for technique. Patient struggles with increased weights during movement and needs short rest periods. Pt encouraged continuing HEP. Follow-up as scheduled.    Rehab Potential  Good    PT Frequency  2x / week    PT Duration  12 weeks    PT Treatment/Interventions  Therapeutic exercise;Therapeutic activities;Functional mobility training;Stair training;Balance training;Neuromuscular re-education;Patient/family education;Aquatic Therapy    PT Next Visit Plan  mobility training, strengthening, balanace training    PT Home Exercise Plan  LAQ, hip flex    Consulted and Agree with Plan of Care  Patient;Family member/caregiver       Patient will benefit from skilled therapeutic intervention in order to improve the following deficits and impairments:  Decreased balance, Decreased endurance, Decreased mobility, Decreased safety awareness, Decreased strength, Decreased activity tolerance  Visit Diagnosis: Muscle weakness (generalized)  Other lack of coordination  Difficulty in walking, not elsewhere classified     Problem List Patient Active Problem List   Diagnosis Date Noted  . Pain and swelling of right lower extremity 01/02/2017  . Parkinson's disease ([place]) 01/02/2017    Alanson Puls, PT DPT 02/11/2018, 1:35 PM  [place] MAIN [facility] SERVICES [address] [place], [place], [address] Phone: [phone]   Fax:  [phone]  Name: Kelsey Owens MRN: [id number] Date of Birth: [birth year]

## 2018-02-11 NOTE — Therapy (Signed)
[name] MAIN [facility] SERVICES [address] [place], [place], [address] Phone: [phone]   Fax:  [phone]  Occupational Therapy Treatment  Patient Details  Name: Kelsey Owens MRN: [id number] Date of Birth: 1940-07-12 No data recorded  Encounter Date: 02/11/2018  OT End of Session - 02/11/18 1349    Visit Number  26    Number of Visits  51    Date for OT Re-Evaluation  04/24/18    Authorization Time Period  Visit 2 of 10 for progress report period starting 02/04/2018    OT Start Time  1345    OT Stop Time  1430    OT Time Calculation (min)  45 min    Activity Tolerance  Patient tolerated treatment well    Behavior During Therapy  [facility] for tasks assessed/performed       Past Medical History:  Diagnosis Date  . Arthritis   . Bursitis   . CAD (coronary artery disease)   . GERD (gastroesophageal reflux disease)   . Hyperlipidemia   . Hypertension   . Hypophonia   . Intercostal pain   . Parkinson disease ([place])   . Paroxysmal supraventricular tachycardia [facility])     Past Surgical History:  Procedure Laterality Date  . ABDOMINAL HYSTERECTOMY    . CHOLECYSTECTOMY    . DEEP BRAIN STIMULATOR PLACEMENT    . FLEXIBLE SIGMOIDOSCOPY N/A 12/19/2017   Procedure: FLEXIBLE SIGMOIDOSCOPY;  Surgeon: Manya Silvas, MD;  Location: [facility] ENDOSCOPY;  Service: Endoscopy;  Laterality: N/A;  . HEMORROIDECTOMY      There were no vitals filed for this visit.  Subjective Assessment - 02/11/18 1348    Subjective   Pt. reports she had a nice Holiday.    Patient is accompained by:  Family member    Pertinent History  Pt. is a 77 y.o. female with a history of Parkinsons Disease. Pt. has a Deep Brain Stimulator, and has recently had it adjusted. Pt. has had multiple falls, and has a history of multiple wrist fractures.     Patient Stated Goals  To be as independent as possible.       OT TREATMENT    Selfcare:  Pt. worked on [employer] meal, and grocery lists for 3 days with writing legibility 25%, decreased size, and deviation diagonally above the line. Pt. made lists of family member names with 50% legibility. Pt. worked on writing responses to home situational/safety/judgement questions. Pt. attempted writing lists of animal names, and colors.                        OT Education - 02/11/18 1349    Education Details  RUE ROM, and Hodge skills    Person(s) Educated  Patient    Methods  Explanation;Demonstration    Comprehension  Verbalized understanding;Returned demonstration          OT Long Term Goals - 01/30/18 1425      OT LONG TERM GOAL #1   Title  Pt. will improve BUE strength by 2 muscle grades to improve ADL, and IADL functioning.    Baseline  01/30/2018: Pt. conitnues to present with weakness in BUEs    Time  12    Period  Weeks    Status  On-going    Target Date  04/24/18      OT LONG TERM GOAL #2   Title  Pt. will increase Left grip strength by 5# to  be able to open jars, and containers.    Baseline  01/30/2018: Pt. is now able toopen jars, and containers.    Time  12    Period  Weeks    Status  Partially Met    Target Date  04/24/18      OT LONG TERM GOAL #3   Title  Pt. will improve left hand [facility] skills  to be able to button clothing efficiently    Baseline  01/30/2018: Pt. has difficulty manipulating buttons efficiently.    Time  12    Period  Weeks    Status  On-going    Target Date  04/24/18      OT LONG TERM GOAL #4   Title  Pt. will independently write one sentence efficiently with 75% legibility.    Baseline  01/30/2018: Pt. conitnues to present with decreased writing legibility.    Time  12    Period  Weeks    Status  On-going    Target Date  04/24/18      OT LONG TERM GOAL #5   Title  Pt. independently demonstrate work simplification strategies, and compensatory startegies for safe IADL tasks/home management tasks.         OT LONG TERM GOAL #6    Title  Pt. will independently demonstrate work simplification strategies, and compensatory startegies within the kitchen from w/c height.    Baseline  Continue    Time  12    Period  Weeks    Status  On-going    Target Date  04/24/18      OT LONG TERM GOAL #7   Title  Pt. will be able to complete toilet hygiene care with supervision.    Baseline  Pt. is unable to perform the task.    Time  12    Period  Weeks    Status  On-going    Target Date  04/24/18            Plan - 02/11/18 1350    Clinical Impression Statement Pt. is making steady progress, and is improving with [facility] skills. Pt. continues to present with decreased writing legibility, and speed with written text progressively getting smaller, and more illlegible as the list, and word length progresses. Pt. presents with decreased spacing, and deviation from the line diagonally above the line. Pt. continues to work on improving writing legibility and efficiency, ADLs, and IADL care needs.    Occupational Profile and client history currently impacting functional performance  Pt. resides with her husband who is a Theme park manager. Pt. has grown children.    Occupational performance deficits (Please refer to evaluation for details):  ADL's;IADL's    Rehab Potential  Good    Current Impairments/barriers affecting progress:  Positive Barriers: age, motivation, family support. Negative Barriers: multiple comorbidities, history of multiple falls.    OT Frequency  2x / week    OT Duration  12 weeks    OT Treatment/Interventions  Self-care/ADL training;Neuromuscular education;Therapeutic exercise;DME and/or AE instruction;Visual/perceptual remediation/compensation;Patient/family education;Therapeutic activities    Clinical Decision Making  Several treatment options, min-mod task modification necessary    Consulted and Agree with Plan of Care  Patient       Patient will benefit from skilled therapeutic intervention in order to improve the  following deficits and impairments:  Abnormal gait, Decreased cognition, Pain, Decreased strength, Impaired UE functional use, Decreased activity tolerance, Decreased balance  Visit Diagnosis: Muscle weakness (generalized)  Other lack of coordination  Problem List Patient Active Problem List   Diagnosis Date Noted  . Pain and swelling of right lower extremity 01/02/2017  . Parkinson's disease ([place]) 01/02/2017    Harrel Carina, MS, OTR/L 02/11/2018, 2:13 PM  [place] MAIN [facility] SERVICES [address] [place], [place], [address] Phone: [phone]   Fax:  [phone]  Name: Kelsey Owens MRN: [id number] Date of Birth: 08-08-1940

## 2018-02-19 ENCOUNTER — Ambulatory Visit: Payer: Medicare Other | Admitting: Occupational Therapy

## 2018-02-21 ENCOUNTER — Encounter: Payer: Self-pay | Admitting: Physical Therapy

## 2018-02-21 ENCOUNTER — Ambulatory Visit: Payer: Medicare Other

## 2018-02-21 ENCOUNTER — Ambulatory Visit: Payer: Medicare Other | Attending: Family Medicine | Admitting: Occupational Therapy

## 2018-02-21 ENCOUNTER — Encounter: Payer: Self-pay | Admitting: Occupational Therapy

## 2018-02-21 DIAGNOSIS — M6281 Muscle weakness (generalized): Secondary | ICD-10-CM

## 2018-02-21 DIAGNOSIS — M7989 Other specified soft tissue disorders: Secondary | ICD-10-CM | POA: Insufficient documentation

## 2018-02-21 DIAGNOSIS — R262 Difficulty in walking, not elsewhere classified: Secondary | ICD-10-CM

## 2018-02-21 DIAGNOSIS — M79604 Pain in right leg: Secondary | ICD-10-CM

## 2018-02-21 DIAGNOSIS — G20 Parkinson's disease: Secondary | ICD-10-CM | POA: Insufficient documentation

## 2018-02-21 DIAGNOSIS — R278 Other lack of coordination: Secondary | ICD-10-CM

## 2018-02-21 NOTE — Therapy (Signed)
[place] MAIN [facility] SERVICES [address] [place], [place], [address] Phone: [phone]   Fax:  [phone]  Occupational Therapy Treatment  Patient Details  Name: Kelsey Owens MRN: [id number] Date of Birth: [birth year] No data recorded  Encounter Date: 02/21/2018  OT End of Session - 02/21/18 1544    Visit Number  27    Number of Visits  59    Date for OT Re-Evaluation  04/24/18    Authorization Time Period  Visit 3 of 10 for progress report period starting 02/04/2018    OT Start Time  1515    OT Stop Time  1600    OT Time Calculation (min)  45 min    Activity Tolerance  Patient tolerated treatment well    Behavior During Therapy  [facility] for tasks assessed/performed       Past Medical History:  Diagnosis Date  . Arthritis   . Bursitis   . CAD (coronary artery disease)   . GERD (gastroesophageal reflux disease)   . Hyperlipidemia   . Hypertension   . Hypophonia   . Intercostal pain   . Parkinson disease ([place])   . Paroxysmal supraventricular tachycardia [facility])     Past Surgical History:  Procedure Laterality Date  . ABDOMINAL HYSTERECTOMY    . CHOLECYSTECTOMY    . DEEP BRAIN STIMULATOR PLACEMENT    . FLEXIBLE SIGMOIDOSCOPY N/A 12/19/2017   Procedure: FLEXIBLE SIGMOIDOSCOPY;  Surgeon: Manya Silvas, MD;  Location: [facility] ENDOSCOPY;  Service: Endoscopy;  Laterality: N/A;  . HEMORROIDECTOMY      There were no vitals filed for this visit.  Subjective Assessment - 02/21/18 1543    Subjective   Pt. reports she is doing well today.    Patient is accompained by:  Family member    Pertinent History  Pt. is a 78 y.o. female with a history of Parkinsons Disease. Pt. has a Deep Brain Stimulator, and has recently had it adjusted. Pt. has had multiple falls, and has a history of multiple wrist fractures.     Patient Stated Goals  To be as independent as possible.     Currently in Pain?  No/denies      OT TREATMENT     Selfcare:  Pt. worked on Risk manager words in printed form, and cursive form. Pt. Presented with positive deviation diagonally upwards with the unlined paper. No deviation present from the line when using lined paper. Pt. worked on toileting skills with minA transferring to the commode with the Delta frame over the commode, modA to transfer back to the w/c with cuing and assist and guiding of the hips in the direction of the chair, as pt. was unable to motor plan through the transfer towards the w/c. Pt. Was able to perform toilet hygiene, and hand hygiene. Pt. Required cues secondary to confusion at times.                        OT Education - 02/21/18 2219    Education Details  RUE ROM, and [facility] skills, toileting skills    Person(s) Educated  Patient    Methods  Explanation;Demonstration    Comprehension  Verbalized understanding;Returned demonstration          OT Long Term Goals - 01/30/18 1425      OT LONG TERM GOAL #1   Title  Pt. will improve BUE strength by 2 muscle grades to improve ADL, and IADL functioning.  Baseline  01/30/2018: Pt. conitnues to present with weakness in BUEs    Time  12    Period  Weeks    Status  On-going    Target Date  04/24/18      OT LONG TERM GOAL #2   Title  Pt. will increase Left grip strength by 5# to be able to open jars, and containers.    Baseline  01/30/2018: Pt. is now able toopen jars, and containers.    Time  12    Period  Weeks    Status  Partially Met    Target Date  04/24/18      OT LONG TERM GOAL #3   Title  Pt. will improve left hand [facility] skills  to be able to button clothing efficiently    Baseline  01/30/2018: Pt. has difficulty manipulating buttons efficiently.    Time  12    Period  Weeks    Status  On-going    Target Date  04/24/18      OT LONG TERM GOAL #4   Title  Pt. will independently write one sentence efficiently with 75% legibility.    Baseline  01/30/2018: Pt. conitnues  to present with decreased writing legibility.    Time  12    Period  Weeks    Status  On-going    Target Date  04/24/18      OT LONG TERM GOAL #5   Title  Pt. independently demonstrate work simplification strategies, and compensatory startegies for safe IADL tasks/home management tasks.         OT LONG TERM GOAL #6   Title  Pt. will independently demonstrate work simplification strategies, and compensatory startegies within the kitchen from w/c height.    Baseline  Continue    Time  12    Period  Weeks    Status  On-going    Target Date  04/24/18      OT LONG TERM GOAL #7   Title  Pt. will be able to complete toilet hygiene care with supervision.    Baseline  Pt. is unable to perform the task.    Time  12    Period  Weeks    Status  On-going    Target Date  04/24/18            Plan - 02/21/18 1555    Clinical Impression Statement  Pt. contnues to make steady progress overall. Pt. presents with impaired [facility] skills, writing legibility, and toileting skills. Pt. conitnues to work on improving UE strength, and coordination skills, as well as improved ADL, and IADL functioning.     Occupational Profile and client history currently impacting functional performance  Pt. resides with her husband who is a Theme park manager. Pt. has grown children.    Occupational performance deficits (Please refer to evaluation for details):  ADL's;IADL's    Rehab Potential  Good    Current Impairments/barriers affecting progress:  Positive Barriers: age, motivation, family support. Negative Barriers: multiple comorbidities, history of multiple falls.    OT Frequency  2x / week    OT Duration  12 weeks    OT Treatment/Interventions  Self-care/ADL training;Neuromuscular education;Therapeutic exercise;DME and/or AE instruction;Visual/perceptual remediation/compensation;Patient/family education;Therapeutic activities    Clinical Decision Making  Several treatment options, min-mod task modification necessary     Consulted and Agree with Plan of Care  Patient       Patient will benefit from skilled therapeutic intervention in order to improve the following deficits  and impairments:  Abnormal gait, Decreased cognition, Pain, Decreased strength, Impaired UE functional use, Decreased activity tolerance, Decreased balance  Visit Diagnosis: Muscle weakness (generalized)  Other lack of coordination    Problem List Patient Active Problem List   Diagnosis Date Noted  . Pain and swelling of right lower extremity 01/02/2017  . Parkinson's disease ([place]) 01/02/2017    Harrel Carina, MS, OTR/L 02/21/2018, 10:27 PM  [place] MAIN [facility] SERVICES [address] [place], [place], [address] Phone: [phone]   Fax:  [phone]  Name: Eldoris Beiser MRN: [id number] Date of Birth: 16-Feb-1940

## 2018-02-21 NOTE — Therapy (Signed)
[place] MAIN [facility] SERVICES [address] [place], [place], [address] Phone: [phone]   Fax:  [phone]  Physical Therapy Treatment and Recertification  Physical Therapy Progress Note  Dates of reporting period  01/07/2018   to   02/21/2018   Patient Details  Name: Kelsey Owens MRN: [id number] Date of Birth: 02-20-1940 Referring Provider (PT): Jennelle Human   Encounter Date: 02/21/2018  PT End of Session - 02/21/18 1509    Visit Number  38    Number of Visits  50    Date for PT Re-Evaluation  04/18/18    Authorization Type  9/10    PT Start Time  1510    PT Stop Time  1600    PT Time Calculation (min)  50 min    Equipment Utilized During Treatment  Gait belt    Activity Tolerance  Patient tolerated treatment well    Behavior During Therapy  [facility] for tasks assessed/performed       Past Medical History:  Diagnosis Date  . Arthritis   . Bursitis   . CAD (coronary artery disease)   . GERD (gastroesophageal reflux disease)   . Hyperlipidemia   . Hypertension   . Hypophonia   . Intercostal pain   . Parkinson disease ([place])   . Paroxysmal supraventricular tachycardia [facility])     Past Surgical History:  Procedure Laterality Date  . ABDOMINAL HYSTERECTOMY    . CHOLECYSTECTOMY    . DEEP BRAIN STIMULATOR PLACEMENT    . FLEXIBLE SIGMOIDOSCOPY N/A 12/19/2017   Procedure: FLEXIBLE SIGMOIDOSCOPY;  Surgeon: Manya Silvas, MD;  Location: [facility] ENDOSCOPY;  Service: Endoscopy;  Laterality: N/A;  . HEMORROIDECTOMY      There were no vitals filed for this visit.  Subjective Assessment - 02/21/18 1515    Subjective  Pt has no complaints of pain. States that when/if she falls, it is usually when she is turning. Her most recent fall was about 6-7wks ago. Pt thinks that her balance has gotten a bit better with PT, still having trouble with transfers.    Pertinent History  Patient has had parkinsons disease for the last 20 years. Her  mobility has been getting worse after she had a left radius fx.  She was walking with RW in 10/18.  Patient has worseing loss of balance beginning 10/18.  She had HHPT 2 x week for 2 months beginining after hospitilization in Dec 2018.     Patient Stated Goals  Patient wants to be able to walk better and not fall as often    Currently in Pain?  No/denies        TREATMENT:  Functional Activity: Pt performed 5x sit to stand with minAx1, verbal cues for hand placement, scooting to edge of seat and LE placement. Posterior lean evident in standing, modAx1 to maintain balance. Pt performed standing balance with minAx1 to stand, use of RW. Unable to maintain balance without minAx1 from PT. Verbal cues to increase anterior weight shift, base of support, upright posture with some improvement.  Gait Training: Pt ambulated a total of 60f, 1 sitting rest break. Ambulated with RW and min-modAx1 to maintain balance due to posterior lean, as well as AD management. Pt mildly SOB after ea ambulation trial. Close WC follow for safety.  Therapeutic exercises: Patient performed seated LAQ with 2 lbs x 10 x 2 Patient performed seated marching with 2lbs 10 x 2 Patient performed heel/toe raises with 2lb 10 x 2 Patient  performs seated hip abd/ER with YTB x 20  Patient performed seated lateral foot taps 2x10 bilaterally Needed verbal/tactile cues for activity pacing, exercise form and technique to optimize movement, muscle activation.    PT Education - 02/21/18 1517    Education Details  HEP, POC, goals, gait mechanics    Person(s) Educated  Patient    Methods  Explanation;Demonstration    Comprehension  Verbalized understanding;Need further instruction       PT Short Term Goals - 02/21/18 1517      PT SHORT TERM GOAL #1   Title  Patient will be independent in home exercise program to improve strength/mobility for better functional independence with ADLs.    Time  4    Period  Weeks    Status   Achieved      PT SHORT TERM GOAL #2   Title  Patient will perform sit to stand with correct hand placement and without support on the back of the chair with RW  and SBA.     Baseline  Patient needs min assist and needs min assist for correct hand placement. MinAx1 and minimum verbal cues for handplacement with ea attempt.    Time  4    Period  Weeks    Status  Partially Met    Target Date  03/21/18      PT SHORT TERM GOAL #3   Title  Patient will stand for 1 minute with UE support to be able to perform safe transfers.     Baseline  Patient can stand for 10 seconds; Pt unable to stand without assistance, minAx1 for 30seconds    Time  8    Period  Weeks    Status  On-going    Target Date  04/18/18        PT Long Term Goals - 02/21/18 1613      PT LONG TERM GOAL #1   Title  Patient will be able to ambulate in parallel bars x 10 feet with min assist.    Baseline  ; Patient is ambulating wiht rollator 25 feet and min assist,     Time  12    Period  Weeks    Status  Achieved      PT LONG TERM GOAL #2   Title  Patient will ambulate short distances ,25 feet, with RW and mod assist.     Baseline  : 08/22/17 patient is ambulating 15 feet with RW:09/12/17 25 feet with min assist and rollator, 10/25/17, 12/03/17 patient ambulates 100 feet with min assist and rollator, 01/03/18= mod assist for 100 feet with rollator; 19/2020 min-mod assist for 81f    Time  8    Period  Weeks    Status  On-going    Target Date  04/18/18      PT LONG TERM GOAL #3   Title  Patient will be able to ascend 1 step with min assist with LARD.    Baseline  01/03/18=max assist with HHA    Time  12    Period  Weeks    Status  On-going    Target Date  04/18/18      PT LONG TERM GOAL #4   Title  Patient (> 674years old) will complete five times sit to stand test in < 15 seconds indicating an increased LE strength and improved balance.    Baseline  01/03/18= 57.45 sec with mod assist ; 02/21/2018 40 secs with  min-modA, posterior lean present  with initial standing balance    Time  8    Period  Weeks    Status  On-going    Target Date  04/18/18      PT LONG TERM GOAL #5   Title  Patient will be able to perform sit to stand transfer with sBA from [facility]     Baseline  01/03/18 needs mod assist: 02/21/2018 needs min assist and verbal cues for anterior weight shift, scooting to edge of seat, LE positioning    Period  Weeks    Status  On-going    Target Date  04/18/18            Plan - 02/22/18 1033    Clinical Impression Statement  Patient's condition has the potential to improve in response to therapy. PT and pt discussed importance with compliance with HEP as well as attendance with PT and pt motivated and agreeable to improve. Maximum improvement is yet to be obtained. The anticipated improvement is attainable and reasonable in a generally predictable time. Pt reports no falls in the last 6-7wks indicating an improvement in safety at home. Pt continues to improve gait mechanics with repetition and skilled PT intervention, cueing, and assistance. The patient would benefit from further skilled PT intervention to continue to progress towards goals and to maximize mobility, safety, and gait.     Rehab Potential  Good    PT Frequency  2x / week    PT Duration  8 weeks    PT Treatment/Interventions  Therapeutic exercise;Therapeutic activities;Functional mobility training;Stair training;Balance training;Neuromuscular re-education;Patient/family education;Aquatic Therapy    PT Next Visit Plan  mobility training, strengthening, balanace training    PT Home Exercise Plan  HEP updated, administered (LAQ, seated marching, hip abduction with YTB, ankle/toe raises)    Consulted and Agree with Plan of Care  Patient;Family member/caregiver       Patient will benefit from skilled therapeutic intervention in order to improve the following deficits and impairments:  Decreased balance, Decreased endurance, Decreased  mobility, Decreased safety awareness, Decreased strength, Decreased activity tolerance  Visit Diagnosis: Muscle weakness (generalized)  Difficulty in walking, not elsewhere classified  Parkinson's disease ([facility])  Pain and swelling of right lower extremity     Problem List Patient Active Problem List   Diagnosis Date Noted  . Pain and swelling of right lower extremity 01/02/2017  . Parkinson's disease ([place]) 01/02/2017    Lieutenant Diego PT, DPT 10:35 AM,02/22/18 [place] MAIN [facility] SERVICES [address] [place], [place], [address] Phone: [phone]   Fax:  [phone]  Name: Kelsey Owens MRN: [id number] Date of Birth: 04-Jun-1940

## 2018-02-26 ENCOUNTER — Ambulatory Visit: Payer: Medicare Other | Admitting: Occupational Therapy

## 2018-02-26 ENCOUNTER — Ambulatory Visit: Payer: Medicare Other

## 2018-02-28 ENCOUNTER — Ambulatory Visit: Payer: Medicare Other | Admitting: Physical Therapy

## 2018-02-28 ENCOUNTER — Ambulatory Visit: Payer: Medicare Other | Admitting: Occupational Therapy

## 2018-03-05 ENCOUNTER — Ambulatory Visit: Payer: Medicare Other | Admitting: Occupational Therapy

## 2018-03-05 ENCOUNTER — Encounter: Payer: Self-pay | Admitting: Occupational Therapy

## 2018-03-05 ENCOUNTER — Ambulatory Visit: Payer: Medicare Other

## 2018-03-05 DIAGNOSIS — M6281 Muscle weakness (generalized): Secondary | ICD-10-CM | POA: Diagnosis not present

## 2018-03-05 DIAGNOSIS — G20 Parkinson's disease: Secondary | ICD-10-CM

## 2018-03-05 DIAGNOSIS — R278 Other lack of coordination: Secondary | ICD-10-CM

## 2018-03-05 DIAGNOSIS — R262 Difficulty in walking, not elsewhere classified: Secondary | ICD-10-CM

## 2018-03-05 NOTE — Therapy (Signed)
[place] MAIN [facility] SERVICES [address] [place], [place], [address] Phone: [phone]   Fax:  [phone]  Occupational Therapy Treatment  Patient Details  Name: Kelsey Owens MRN: [id number] Date of Birth: Jun 13, 1940 No data recorded  Encounter Date: 03/05/2018  OT End of Session - 03/05/18 1452    Visit Number  28    Number of Visits  52    Date for OT Re-Evaluation  04/24/18    Authorization Time Period  Progress report period starting 02/04/2018    OT Start Time  1433    OT Stop Time  1515    OT Time Calculation (min)  42 min    Activity Tolerance  Patient tolerated treatment well    Behavior During Therapy  [facility] for tasks assessed/performed       Past Medical History:  Diagnosis Date  . Arthritis   . Bursitis   . CAD (coronary artery disease)   . GERD (gastroesophageal reflux disease)   . Hyperlipidemia   . Hypertension   . Hypophonia   . Intercostal pain   . Parkinson disease ([place])   . Paroxysmal supraventricular tachycardia [facility])     Past Surgical History:  Procedure Laterality Date  . ABDOMINAL HYSTERECTOMY    . CHOLECYSTECTOMY    . DEEP BRAIN STIMULATOR PLACEMENT    . FLEXIBLE SIGMOIDOSCOPY N/A 12/19/2017   Procedure: FLEXIBLE SIGMOIDOSCOPY;  Surgeon: Manya Silvas, MD;  Location: [facility] ENDOSCOPY;  Service: Endoscopy;  Laterality: N/A;  . HEMORROIDECTOMY      There were no vitals filed for this visit.  Subjective Assessment - 03/05/18 1450    Subjective   Pt. reports being cold.    Patient is accompained by:  Family member    Pertinent History  Pt. is a 78 y.o. female with a history of Parkinsons Disease. Pt. has a Deep Brain Stimulator, and has recently had it adjusted. Pt. has had multiple falls, and has a history of multiple wrist fractures.     Patient Stated Goals  To be as independent as possible.     Currently in Pain?  No/denies      OT TREATMENT    Neuro muscular re-education:  Pt. worked  on grasping heavy duty resistive magnets of varying sizes, manipulating them, disconnecting them, and pulling them apart. Pt. worked on [facility] grasping 1/2" small magnetic pegs, disconnecting them using bilateral 2pt. pinch grasps. Pt. worked on copying telephone numbers in printed form using very small printed form, presenting with decreased spacing. Pt. Required cues for letter size, spacing, and legibility. Pt. With 50% legibility for numbers, and 25% legibility when listing the days of the week using a pen with medium width.                        OT Education - 03/05/18 1452    Education Details  RUE ROM, and [place] skills, toileting skills    Person(s) Educated  Patient    Methods  Explanation;Demonstration    Comprehension  Verbalized understanding;Returned demonstration          OT Long Term Goals - 01/30/18 1425      OT LONG TERM GOAL #1   Title  Pt. will improve BUE strength by 2 muscle grades to improve ADL, and IADL functioning.    Baseline  01/30/2018: Pt. conitnues to present with weakness in BUEs    Time  12    Period  Weeks  Status  On-going    Target Date  04/24/18      OT LONG TERM GOAL #2   Title  Pt. will increase Left grip strength by 5# to be able to open jars, and containers.    Baseline  01/30/2018: Pt. is now able toopen jars, and containers.    Time  12    Period  Weeks    Status  Partially Met    Target Date  04/24/18      OT LONG TERM GOAL #3   Title  Pt. will improve left hand [employer] skills  to be able to button clothing efficiently    Baseline  01/30/2018: Pt. has difficulty manipulating buttons efficiently.    Time  12    Period  Weeks    Status  On-going    Target Date  04/24/18      OT LONG TERM GOAL #4   Title  Pt. will independently write one sentence efficiently with 75% legibility.    Baseline  01/30/2018: Pt. conitnues to present with decreased writing legibility.    Time  12    Period  Weeks    Status  On-going     Target Date  04/24/18      OT LONG TERM GOAL #5   Title  Pt. independently demonstrate work simplification strategies, and compensatory startegies for safe IADL tasks/home management tasks.         OT LONG TERM GOAL #6   Title  Pt. will independently demonstrate work simplification strategies, and compensatory startegies within the kitchen from w/c height.    Baseline  Continue    Time  12    Period  Weeks    Status  On-going    Target Date  04/24/18      OT LONG TERM GOAL #7   Title  Pt. will be able to complete toilet hygiene care with supervision.    Baseline  Pt. is unable to perform the task.    Time  12    Period  Weeks    Status  On-going    Target Date  04/24/18            Plan - 03/05/18 1454    Clinical Impression Statement  Pt. is making progress overall, however continues to present with limited UE strength, [facility] skills, writing legibility, and toileting. Pt. continues to require increased assist for toileting skills.     Occupational Profile and client history currently impacting functional performance  Pt. resides with her husband who is a Theme park manager. Pt. has grown children.    Occupational performance deficits (Please refer to evaluation for details):  ADL's;IADL's    Rehab Potential  Good    Current Impairments/barriers affecting progress:  Positive Barriers: age, motivation, family support. Negative Barriers: multiple comorbidities, history of multiple falls.    OT Frequency  2x / week    OT Duration  12 weeks    OT Treatment/Interventions  Self-care/ADL training;Neuromuscular education;Therapeutic exercise;DME and/or AE instruction;Visual/perceptual remediation/compensation;Patient/family education;Therapeutic activities    Clinical Decision Making  Several treatment options, min-mod task modification necessary    Consulted and Agree with Plan of Care  Patient       Patient will benefit from skilled therapeutic intervention in order to improve the following  deficits and impairments:  Abnormal gait, Decreased cognition, Pain, Decreased strength, Impaired UE functional use, Decreased activity tolerance, Decreased balance  Visit Diagnosis: Muscle weakness (generalized)  Other lack of coordination    Problem List  Patient Active Problem List   Diagnosis Date Noted  . Pain and swelling of right lower extremity 01/02/2017  . Parkinson's disease ([place]) 01/02/2017    Harrel Carina, MS, OTR/L 03/05/2018, 3:08 PM  [place] MAIN [facility] SERVICES [address] [place], [place], [address] Phone: [phone]   Fax:  [phone]  Name: Livvy Spilman MRN: [id number] Date of Birth: 1940-06-22

## 2018-03-05 NOTE — Therapy (Addendum)
[place] MAIN [facility] SERVICES [address] [place], [place], [address] Phone: [phone]   Fax:  [phone]  Physical Therapy Treatment  Patient Details  Name: Kelsey Owens MRN: [id number] Date of Birth: 1940-09-01 Referring Provider (PT): Jennelle Human   Encounter Date: 03/05/2018  PT End of Session - 03/05/18 1749    Visit Number  39    Number of Visits  50    Date for PT Re-Evaluation  04/18/18    Authorization Type  9/10    PT Start Time  1515    PT Stop Time  1555    PT Time Calculation (min)  40 min    Equipment Utilized During Treatment  Gait belt    Activity Tolerance  Patient tolerated treatment well    Behavior During Therapy  WFL for tasks assessed/performed       Past Medical History:  Diagnosis Date  . Arthritis   . Bursitis   . CAD (coronary artery disease)   . GERD (gastroesophageal reflux disease)   . Hyperlipidemia   . Hypertension   . Hypophonia   . Intercostal pain   . Parkinson disease ([place])   . Paroxysmal supraventricular tachycardia [facility])     Past Surgical History:  Procedure Laterality Date  . ABDOMINAL HYSTERECTOMY    . CHOLECYSTECTOMY    . DEEP BRAIN STIMULATOR PLACEMENT    . FLEXIBLE SIGMOIDOSCOPY N/A 12/19/2017   Procedure: FLEXIBLE SIGMOIDOSCOPY;  Surgeon: Manya Silvas, MD;  [facility];  Service: Endoscopy;  Laterality: N/A;  . HEMORROIDECTOMY      There were no vitals filed for this visit.  Subjective Assessment - 03/05/18 1530    Subjective  Pt reported that she had a fall on Tuesday or thursday of last week. Stated she was walking out of the bathroom, and fell forwards, her husband was behind her. Pt unsure if she hit her head. does report some neck pain and R shoulder and arm pain, possibly from her fall.    Patient is accompained by:  Family member    Pertinent History  Patient has had parkinsons disease for the last 20 years. Her mobility has been getting worse  after she had a left radius fx.  She was walking with RW in 10/18.  Patient has worseing loss of balance beginning 10/18.  She had HHPT 2 x week for 2 months beginining after hospitilization in Dec 2018.     Currently in Pain?  No/denies         TREATMENT:  Functional Activity: Pt performed 3x sit to stand with min-modAx1 for weight shift and initial standing balance, verbal cues for hand placement/sequencing Pt performed standing balance with minAx1 to stand, use of RW. Unable to maintain balance without minAx1 from PT. Verbal cues to increase anterior weight shift, base of support, upright posture with some improvement.  Gait Training: Pt ambulated a total of 88f Ambulated with RW and min-modAx1 to maintain balance due to posterior lean, as well as AD management. Pt mildly SOB after ambulation trial. Close WC follow for safety.  Therapeutic exercises: Patient performed seated LAQ with 2 lbs x 10 x 2 Patient performed seated marching with 2lbs 10 x 2 Patient performed heel/toe raises with 2lb 10 x 2 Patient performs seated hip abd/ER with YTB x 20 Patient performed seated lateral foot taps with 2# AW 2x10 bilaterally Needed verbal/tactile cues for activity pacing, exercise form and technique to optimize movement, muscle activation.  PT Education - 03/05/18 1533    Education Details  therex technique/form    Person(s) Educated  Patient    Methods  Explanation    Comprehension  Verbalized understanding;Returned demonstration;Need further instruction       PT Short Term Goals - 02/21/18 1517      PT SHORT TERM GOAL #1   Title  Patient will be independent in home exercise program to improve strength/mobility for better functional independence with ADLs.    Time  4    Period  Weeks    Status  Achieved      PT SHORT TERM GOAL #2   Title  Patient will perform sit to stand with correct hand placement and without support on the back of the chair with RW  and SBA.      Baseline  Patient needs min assist and needs min assist for correct hand placement. MinAx1 and minimum verbal cues for handplacement with ea attempt.    Time  4    Period  Weeks    Status  Partially Met    Target Date  03/21/18      PT SHORT TERM GOAL #3   Title  Patient will stand for 1 minute with UE support to be able to perform safe transfers.     Baseline  Patient can stand for 10 seconds; Pt unable to stand without assistance, minAx1 for 30seconds    Time  8    Period  Weeks    Status  On-going    Target Date  04/18/18        PT Long Term Goals - 02/21/18 1613      PT LONG TERM GOAL #1   Title  Patient will be able to ambulate in parallel bars x 10 feet with min assist.    Baseline  ; Patient is ambulating wiht rollator 25 feet and min assist,     Time  12    Period  Weeks    Status  Achieved      PT LONG TERM GOAL #2   Title  Patient will ambulate short distances ,25 feet, with RW and mod assist.     Baseline  : 08/22/17 patient is ambulating 15 feet with RW:09/12/17 25 feet with min assist and rollator, 10/25/17, 12/03/17 patient ambulates 100 feet with min assist and rollator, 01/03/18= mod assist for 100 feet with rollator; 19/2020 min-mod assist for 26f    Time  8    Period  Weeks    Status  On-going    Target Date  04/18/18      PT LONG TERM GOAL #3   Title  Patient will be able to ascend 1 step with min assist with LARD.    Baseline  01/03/18=max assist with HHA    Time  12    Period  Weeks    Status  On-going    Target Date  04/18/18      PT LONG TERM GOAL #4   Title  Patient (> 639years old) will complete five times sit to stand test in < 15 seconds indicating an increased LE strength and improved balance.    Baseline  01/03/18= 57.45 sec with mod assist ; 02/21/2018 40 secs with min-modA, posterior lean present with initial standing balance    Time  8    Period  Weeks    Status  On-going    Target Date  04/18/18      PT LONG TERM GOAL #  5   Title  Patient  will be able to perform sit to stand transfer with sBA from [facility]     Baseline  01/03/18 needs mod assist: 02/21/2018 needs min assist and verbal cues for anterior weight shift, scooting to edge of seat, LE positioning    Period  Weeks    Status  On-going    Target Date  04/18/18            Plan - 03/05/18 1745    Clinical Impression Statement  Patient with min-modAx1 to achieve standing position, poor initial standing balance, needed support to maintain due to significant posterior lean. Verbal/visual cues needed throughout session to maximize gait quality, anterior weight shift, step length, and AD management. The patient would benefit from further skilled PT to continue to address functional activities, mobility, and safety in home.     Rehab Potential  Good    PT Frequency  2x / week    PT Duration  8 weeks    PT Treatment/Interventions  Therapeutic exercise;Therapeutic activities;Functional mobility training;Stair training;Balance training;Neuromuscular re-education;Patient/family education;Aquatic Therapy    PT Next Visit Plan  mobility training, strengthening, balanace training    PT Home Exercise Plan  HEP updated, administered (LAQ, seated marching, hip abduction with YTB, ankle/toe raises)    Consulted and Agree with Plan of Care  Patient       Patient will benefit from skilled therapeutic intervention in order to improve the following deficits and impairments:  Decreased balance, Decreased endurance, Decreased mobility, Decreased safety awareness, Decreased strength, Decreased activity tolerance  Visit Diagnosis: Muscle weakness (generalized)  Difficulty in walking, not elsewhere classified  Parkinson's disease [facility])     Problem List Patient Active Problem List   Diagnosis Date Noted  . Pain and swelling of right lower extremity 01/02/2017  . Parkinson's disease ([place]) 01/02/2017    Lieutenant Diego PT, DPT 5:52 PM,03/05/18 [place] MAIN [facility] SERVICES [address] [place], [place], [address] Phone: [phone]   Fax:  [phone]  Name: Aija Scarfo MRN: [id number] Date of Birth: [birth year]

## 2018-03-07 ENCOUNTER — Ambulatory Visit: Payer: Medicare Other | Admitting: Occupational Therapy

## 2018-03-07 ENCOUNTER — Ambulatory Visit: Payer: Medicare Other | Admitting: Physical Therapy

## 2018-03-12 ENCOUNTER — Ambulatory Visit: Payer: Medicare Other | Admitting: Occupational Therapy

## 2018-03-12 ENCOUNTER — Ambulatory Visit: Payer: Medicare Other | Admitting: Physical Therapy

## 2018-03-12 ENCOUNTER — Encounter: Payer: Self-pay | Admitting: Physical Therapy

## 2018-03-12 ENCOUNTER — Ambulatory Visit: Payer: Medicare Other

## 2018-03-12 DIAGNOSIS — M6281 Muscle weakness (generalized): Secondary | ICD-10-CM

## 2018-03-12 DIAGNOSIS — R262 Difficulty in walking, not elsewhere classified: Secondary | ICD-10-CM

## 2018-03-12 DIAGNOSIS — G20 Parkinson's disease: Secondary | ICD-10-CM

## 2018-03-12 DIAGNOSIS — R278 Other lack of coordination: Secondary | ICD-10-CM

## 2018-03-13 ENCOUNTER — Encounter: Payer: Self-pay | Admitting: Occupational Therapy

## 2018-03-13 NOTE — Therapy (Signed)
[place] MAIN [facility] SERVICES [address] [place], [place], [address] Phone: [phone]   Fax:  [phone]  Occupational Therapy Treatment  Patient Details  Name: Kelsey Owens MRN: [id number] Date of Birth: [birth year] No data recorded  Encounter Date: 03/12/2018  OT End of Session - 03/13/18 1647    Visit Number  29    Number of Visits  74    Date for OT Re-Evaluation  04/24/18    Authorization Time Period  Progress report period starting 02/04/2018    OT Start Time  1600    OT Stop Time  1645    OT Time Calculation (min)  45 min    Activity Tolerance  Patient tolerated treatment well    Behavior During Therapy  [facility] for tasks assessed/performed       Past Medical History:  Diagnosis Date  . Arthritis   . Bursitis   . CAD (coronary artery disease)   . GERD (gastroesophageal reflux disease)   . Hyperlipidemia   . Hypertension   . Hypophonia   . Intercostal pain   . Parkinson disease ([place])   . Paroxysmal supraventricular tachycardia [facility])     Past Surgical History:  Procedure Laterality Date  . ABDOMINAL HYSTERECTOMY    . CHOLECYSTECTOMY    . DEEP BRAIN STIMULATOR PLACEMENT    . FLEXIBLE SIGMOIDOSCOPY N/A 12/19/2017   Procedure: FLEXIBLE SIGMOIDOSCOPY;  Surgeon: Manya Silvas, MD;  [facility]: [facility] END[facility]COPY;  Service: Endoscopy;  Laterality: N/A;  . HEMORROIDECTOMY      There were no vitals filed for this visit.  Subjective Assessment - 03/13/18 1647    Subjective   Pt. reports feeling good today.    Patient is accompained by:  Family member    Pertinent History  Pt. is a 78 y.o. female with a history of Parkinsons Disease. Pt. has a Deep Brain Stimulator, and has recently had it adjusted. Pt. has had multiple falls, and has a history of multiple wrist fractures.     Patient Stated Goals  To be as independent as possible.     Currently in Pain?  No/denies      OT TREATMENT    Neuro muscular  re-education:  Pt. worked on grasping 1" resistive cubes alternating thumb opposition to the tip of the 2nd digit while the board is placed at a vertical angle. Pt. worked on pressing the cubes back into place while alternating isolated 2nd through 5th digit extension. Pt. required tactile cues for proper form. Pt. worked on grasping 1/2" flat washers from yellow resistive putty.   Selfcare:  Pt. worked on Counselling psychologist telephone numbers. Pt. presented with increased deviation from the line, and cues for spacing. Pt. worked on writing single word with 75% legibility for printed form, and 25% legibility with in cursive form. Legibility was hindered by text with the letters formulated close together.                        OT Education - 03/13/18 1647    Education Details  RUE ROM, and [facility] skills, toileting skills    Person(s) Educated  Patient    Methods  Explanation;Demonstration    Comprehension  Verbalized understanding;Returned demonstration          OT Long Term Goals - 01/30/18 1425      OT LONG TERM GOAL #1   Title  Pt. will improve BUE strength by 2 muscle grades to  improve ADL, and IADL functioning.    Baseline  01/30/2018: Pt. conitnues to present with weakness in BUEs    Time  12    Period  Weeks    Status  On-going    Target Date  04/24/18      OT LONG TERM GOAL #2   Title  Pt. will increase Left grip strength by 5# to be able to open jars, and containers.    Baseline  01/30/2018: Pt. is now able toopen jars, and containers.    Time  12    Period  Weeks    Status  Partially Met    Target Date  04/24/18      OT LONG TERM GOAL #3   Title  Pt. will improve left hand [facility] skills  to be able to button clothing efficiently    Baseline  01/30/2018: Pt. has difficulty manipulating buttons efficiently.    Time  12    Period  Weeks    Status  On-going    Target Date  04/24/18      OT LONG TERM GOAL #4   Title  Pt. will independently write  one sentence efficiently with 75% legibility.    Baseline  01/30/2018: Pt. conitnues to present with decreased writing legibility.    Time  12    Period  Weeks    Status  On-going    Target Date  04/24/18      OT LONG TERM GOAL #5   Title  Pt. independently demonstrate work simplification strategies, and compensatory startegies for safe IADL tasks/home management tasks.         OT LONG TERM GOAL #6   Title  Pt. will independently demonstrate work simplification strategies, and compensatory startegies within the kitchen from w/c height.    Baseline  Continue    Time  12    Period  Weeks    Status  On-going    Target Date  04/24/18      OT LONG TERM GOAL #7   Title  Pt. will be able to complete toilet hygiene care with supervision.    Baseline  Pt. is unable to perform the task.    Time  12    Period  Weeks    Status  On-going    Target Date  04/24/18            Plan - 03/13/18 1648    Clinical Impression Statement  Pt. is making steady progress. Pt. continues to present with decreased motor control, and [facility] skills. Pt. continues to work on improving these skills in order to be able to improve independence with writing legibility, buttoning, and toileting skills. Pt. continues to work on improving UE functioning during ADLs, and IADLs.    Occupational Profile and client history currently impacting functional performance  Pt. resides with her husband who is a Theme park manager. Pt. has grown children.    Occupational performance deficits (Please refer to evaluation for details):  ADL's;IADL's    Rehab Potential  Good    Current Impairments/barriers affecting progress:  Positive Barriers: age, motivation, family support. Negative Barriers: multiple comorbidities, history of multiple falls.    OT Frequency  2x / week    OT Duration  12 weeks    OT Treatment/Interventions  Self-care/ADL training;Neuromuscular education;Therapeutic exercise;DME and/or AE instruction;Visual/perceptual  remediation/compensation;Patient/family education;Therapeutic activities    Clinical Decision Making  Several treatment options, min-mod task modification necessary    Consulted and Agree with Plan of Care  Patient       Patient will benefit from skilled therapeutic intervention in order to improve the following deficits and impairments:  Abnormal gait, Decreased cognition, Pain, Decreased strength, Impaired UE functional use, Decreased activity tolerance, Decreased balance  Visit Diagnosis: Muscle weakness (generalized)  Other lack of coordination    Problem List Patient Active Problem List   Diagnosis Date Noted  . Pain and swelling of right lower extremity 01/02/2017  . Parkinson's disease ([place]) 01/02/2017    Harrel Carina, MS, OTR/L 03/13/2018, 4:56 PM  [place] MAIN [facility] SERVICES [address] [place], [place], [address] Phone: [phone]   Fax:  [phone]  Name: Chatara Lucente MRN: [id number] Date of Birth: [birth year]

## 2018-03-13 NOTE — Therapy (Signed)
[place] MAIN [facility] SERVICES [address] [place], [place], [address] Phone: [phone]   Fax:  [phone]  Physical Therapy Treatment Physical Therapy Progress Note   Dates of reporting period 01/08/19  to  03/12/18   Patient Details  Name: Kelsey Owens MRN: [id number] Date of Birth: 1940/08/29 Referring Provider (PT): Jennelle Human   Encounter Date: 03/12/2018  PT End of Session - 03/12/18 1525    Visit Number  40    Number of Visits  50    Date for PT Re-Evaluation  04/18/18    PT Start Time  8280    PT Stop Time  1555    PT Time Calculation (min)  40 min    Equipment Utilized During Treatment  Gait belt    Activity Tolerance  Patient tolerated treatment well    Behavior During Therapy  WFL for tasks assessed/performed       Past Medical History:  Diagnosis Date  . Arthritis   . Bursitis   . CAD (coronary artery disease)   . GERD (gastroesophageal reflux disease)   . Hyperlipidemia   . Hypertension   . Hypophonia   . Intercostal pain   . Parkinson disease ([place])   . Paroxysmal supraventricular tachycardia [facility])     Past Surgical History:  Procedure Laterality Date  . ABDOMINAL HYSTERECTOMY    . CHOLECYSTECTOMY    . DEEP BRAIN STIMULATOR PLACEMENT    . FLEXIBLE SIGMOIDOSCOPY N/A 12/19/2017   Procedure: FLEXIBLE SIGMOIDOSCOPY;  Surgeon: Manya Silvas, MD;  Location: [facility] ENDOSCOPY;  Service: Endoscopy;  Laterality: N/A;  . HEMORROIDECTOMY      There were no vitals filed for this visit.  Subjective Assessment - 03/12/18 1523    Subjective  Patient reports feeling okay today; She denies any new falls this week: Reports no soreness; She reports her exercises were going well until last Friday; She didn't do many over the weekend; She also just got cortisone injections in her shoulders yesterday;     Patient is accompained by:  Family member    Pertinent History  Patient has had parkinsons disease for the last  20 years. Her mobility has been getting worse after she had a left radius fx.  She was walking with RW in 10/18.  Patient has worseing loss of balance beginning 10/18.  She had HHPT 2 x week for 2 months beginining after hospitilization in Dec 2018.     Currently in Pain?  No/denies    Multiple Pain Sites  No               TREATMENT: Warm up on Nustep BLE only level 2 x5 min (Unbilled);  Patient requires mod VCs for hand placement and min A for safe sit<>stand transfers; Often she tries to pull up on walker and leans backwards indicating poor positioning and unsafe transfer; Required cues for forward weight shift and to push with UE on arm rest for better transfer ability;  Exercise: Seated with yellow tband around BLE: -hip abduction/ER x15 with mod VCs and visual cues to slow down LE movement and increase ROM for better strengthening; -hip flexion march x15 bilaterally with cues to increase AROM for better strengthening and motor control;  Standing in parallel bars: -hip abduction SLR x10 bilaterally with min A for balance and cues for positioning for optimum muscle activation; -mini squat with 2-0 rail assist x10 reps with min A for safety and min VCs to increase knee/hip extension upon  standing for better strengthening;   NMR: Standing in parallel bars: -feet apart, 2-0 rail assist, with min A from therapist for balance x1 min with cues for weight shift for erect posture and static standing balance; with increased time, exhibits heavy posterior lean requiring min A to shift forward; poor hip/knee strategies noted with increased loss of balance with fatigue;   -Staggered stance: Unsupported with min-mod A for balance, 10 sec hold x1 rep each foot in front; Patient required mod VCs for erect posture and to improve hip/knee extension during stance; Attempted head turns but patient very unsteady;   Patient requires several rest breaks due to fatigue.                     PT Education - 03/12/18 1525    Education Details  Progress towards goals; recommendations, HEP reinforced;     Person(s) Educated  Patient    Methods  Explanation;Verbal cues    Comprehension  Verbalized understanding;Returned demonstration;Verbal cues required;Need further instruction       PT Short Term Goals - 03/12/18 1527      PT SHORT TERM GOAL #1   Title  Patient will be independent in home exercise program to improve strength/mobility for better functional independence with ADLs.    Time  4    Period  Weeks    Status  Achieved      PT SHORT TERM GOAL #2   Title  Patient will perform sit to stand with correct hand placement and without support on the back of the chair with RW  and SBA.     Baseline  Patient needs min assist and needs min assist for correct hand placement. MinAx1 and minimum verbal cues for handplacement with ea attempt.; 03/12/18: requires mod VCs for correct hand placement, and min A for safety    Time  4    Period  Weeks    Status  Not Met    Target Date  03/21/18      PT SHORT TERM GOAL #3   Title  Patient will stand for 1 minute with UE support to be able to perform safe transfers.     Baseline  Patient can stand for 10 seconds; Pt unable to stand without assistance, minAx1 for 30seconds; 03/12/18:     Time  8    Period  Weeks    Status  On-going    Target Date  04/18/18        PT Long Term Goals - 03/12/18 1527      PT LONG TERM GOAL #1   Title  Patient will be able to ambulate in parallel bars x 10 feet with min assist.    Baseline  ; Patient is ambulating wiht rollator 25 feet and min assist,     Time  12    Period  Weeks    Status  Achieved      PT LONG TERM GOAL #2   Title  Patient will ambulate short distances ,25 feet, with RW and mod assist.     Baseline  : 08/22/17 patient is ambulating 15 feet with RW:09/12/17 25 feet with min assist and rollator, 10/25/17, 12/03/17 patient ambulates 100 feet with  min assist and rollator, 01/03/18= mod assist for 100 feet with rollator; 19/2020 min-mod assist for 15f    Time  8    Period  Weeks    Status  On-going    Target Date  04/18/18  PT LONG TERM GOAL #3   Title  Patient will be able to ascend 1 step with min assist with LARD.    Baseline  01/03/18=max assist with HHA; 02/20/18: not tested but estimate max A due to sit<>Stand ability and weakness;     Time  12    Period  Weeks    Status  On-going    Target Date  04/18/18      PT LONG TERM GOAL #4   Title  Patient (> 30 years old) will complete five times sit to stand test in < 15 seconds indicating an increased LE strength and improved balance.    Baseline  01/03/18= 57.45 sec with mod assist ; 02/21/2018 40 secs with min-modA, posterior lean present with initial standing balance1/28/20: not assessed;     Time  8    Period  Weeks    Status  On-going    Target Date  04/18/18      PT LONG TERM GOAL #5   Title  Patient will be able to perform sit to stand transfer with sBA from [facility]     Baseline  01/03/18 needs mod assist: 02/21/2018 needs min assist and verbal cues for anterior weight shift, scooting to edge of seat, LE positioning1/28/20: minA with mod VCs with rollator    Period  Weeks    Status  On-going    Target Date  04/18/18            Plan - 03/13/18 1159    Clinical Impression Statement  Patient is progressing slowly; She continues to have significant difficulty with sit<>stand transfers with poor forward trunk progression and impaired mechanics/motor control. She reports minimal freezing but does have significant tremors as a result of Parkinson's Disease. She also has significant weakness in BLE which contributes to impaired mobility; Patient does fatigue quickly with prolonged standing. She expressed desire to improve her strength and transfer safety for better mobility at home. She would benefit from additional skilled PT Intervention to improve strength, balance and gait  safety;     Rehab Potential  Good    PT Frequency  2x / week    PT Duration  8 weeks    PT Treatment/Interventions  Therapeutic exercise;Therapeutic activities;Functional mobility training;Stair training;Balance training;Neuromuscular re-education;Patient/family education;Aquatic Therapy    PT Next Visit Plan  mobility training, strengthening, balanace training    PT Home Exercise Plan  HEP updated, administered (LAQ, seated marching, hip abduction with YTB, ankle/toe raises)    Consulted and Agree with Plan of Care  Patient       Patient will benefit from skilled therapeutic intervention in order to improve the following deficits and impairments:  Decreased balance, Decreased endurance, Decreased mobility, Decreased safety awareness, Decreased strength, Decreased activity tolerance  Visit Diagnosis: Muscle weakness (generalized)  Difficulty in walking, not elsewhere classified  Other lack of coordination  Parkinson's disease [facility])     Problem List Patient Active Problem List   Diagnosis Date Noted  . Pain and swelling of right lower extremity 01/02/2017  . Parkinson's disease ([place]) 01/02/2017    , 03/13/2018, 12:07 PM  [place] MAIN [facility] SERVICES [address] [place], [place], [address] Phone: [phone]   Fax:  [phone]  Name: Kelsey Owens MRN: [id number] Date of Birth: 05-Jun-1940

## 2018-03-14 ENCOUNTER — Encounter: Payer: Self-pay | Admitting: Occupational Therapy

## 2018-03-14 ENCOUNTER — Ambulatory Visit: Payer: Medicare Other | Admitting: Physical Therapy

## 2018-03-14 ENCOUNTER — Ambulatory Visit: Payer: Medicare Other | Admitting: Occupational Therapy

## 2018-03-14 DIAGNOSIS — R262 Difficulty in walking, not elsewhere classified: Secondary | ICD-10-CM

## 2018-03-14 DIAGNOSIS — R278 Other lack of coordination: Secondary | ICD-10-CM

## 2018-03-14 DIAGNOSIS — M6281 Muscle weakness (generalized): Secondary | ICD-10-CM

## 2018-03-14 NOTE — Therapy (Signed)
[place] MAIN [facility] SERVICES [address] [place], [place], [address] Phone: [phone]   Fax:  [phone]  Physical Therapy Treatment  Patient Details  Name: Kelsey Owens MRN: [id number] Date of Birth: 07/03/1940 Referring Provider (PT): Jennelle Human   Encounter Date: 03/14/2018  PT End of Session - 03/14/18 1354    Visit Number  41    Number of Visits  50    Date for PT Re-Evaluation  04/18/18    Authorization Type  goals last assessed 03/12/18    PT Start Time  1345    PT Stop Time  1430    PT Time Calculation (min)  45 min    Equipment Utilized During Treatment  Gait belt    Activity Tolerance  Patient tolerated treatment well    Behavior During Therapy  [facility] for tasks assessed/performed       Past Medical History:  Diagnosis Date  . Arthritis   . Bursitis   . CAD (coronary artery disease)   . GERD (gastroesophageal reflux disease)   . Hyperlipidemia   . Hypertension   . Hypophonia   . Intercostal pain   . Parkinson disease ([place])   . Paroxysmal supraventricular tachycardia [facility])     Past Surgical History:  Procedure Laterality Date  . ABDOMINAL HYSTERECTOMY    . CHOLECYSTECTOMY    . DEEP BRAIN STIMULATOR PLACEMENT    . FLEXIBLE SIGMOIDOSCOPY N/A 12/19/2017   Procedure: FLEXIBLE SIGMOIDOSCOPY;  Surgeon: Manya Silvas, MD;  Location: [facility] ENDOSCOPY;  Service: Endoscopy;  Laterality: N/A;  . HEMORROIDECTOMY      There were no vitals filed for this visit.  Subjective Assessment - 03/14/18 1352    Subjective  Patient reports feeling okay; She is still having some soreness in right shoulder; No new falls this week, but patient reports falling often;     Patient is accompained by:  Family member    Pertinent History  Patient has had parkinsons disease for the last 20 years. Her mobility has been getting worse after she had a left radius fx.  She was walking with RW in 10/18.  Patient has worseing loss of balance  beginning 10/18.  She had HHPT 2 x week for 2 months beginining after hospitilization in Dec 2018.     Currently in Pain?  Yes    Pain Score  3     Pain Location  Shoulder    Pain Orientation  Right    Pain Descriptors / Indicators  Burning;Sharp    Pain Type  Chronic pain    Pain Radiating Towards  radiates down RUE    Pain Onset  More than a month ago    Pain Frequency  Intermittent    Aggravating Factors   lifting overhead    Pain Relieving Factors  resting    Effect of Pain on Daily Activities  decreased pushing ability for tranfers;     Multiple Pain Sites  No             TREATMENT:   Requires min A for stand pivot transfer with Rollator from Wheelchair<>mat table with mod VCs for hand placement and positioning for safety;  Sitting on mat table: Utilized NDT techniques to address mechanics with sit<>stand transfers; Seated forward weight shift with therapist sitting to patient's right side for proprioceptive feedback and balance: -forward weight shift x5 reps -forward weight shift with weight shift over feet lifting bottom off table x5 reps -progressed to PT  instructed patient in sit<>stand transfers starting with elevated mat table, with mod-min A and 1 HHA to facilitate forward weight shift for better transfer ability:                 X5 reps with cues for forward weight shift Progressed to lower mat table                 X5 reps with 1 HHA and min A with cues for forward weight shift;  PT sitting beside patient, doing a partial sit<>Stand with patient in squat position with side/side weight shift to challenge LE control and strengthening x5 reps x2 sets Utilized mirror for visual cues for better mobility;  Seated: educated patient in sitting positioning and posture for better neutral positioning, utilizing mirror for visual feedback x2 min; patient reports slight increase in pulling in lumbar spine with sitting erect;  Standing: Side/side weight shift with 1 HHA  with feet apart x5 reps to challenge stance control and reorient patient to midline;  At end of session, patient ambulated 12 feet with rollator mat table to wheelchair with CGA, exhibiting improved erect posture with less posterior lean;   Patient reports moderate fatigue at end of session;                   PT Education - 03/14/18 1354    Education Details  transfers/strengthening;     Person(s) Educated  Patient    Methods  Explanation;Verbal cues    Comprehension  Verbalized understanding;Returned demonstration;Verbal cues required;Need further instruction       PT Short Term Goals - 03/12/18 1527      PT SHORT TERM GOAL #1   Title  Patient will be independent in home exercise program to improve strength/mobility for better functional independence with ADLs.    Time  4    Period  Weeks    Status  Achieved      PT SHORT TERM GOAL #2   Title  Patient will perform sit to stand with correct hand placement and without support on the back of the chair with RW  and SBA.     Baseline  Patient needs min assist and needs min assist for correct hand placement. MinAx1 and minimum verbal cues for handplacement with ea attempt.; 03/12/18: requires mod VCs for correct hand placement, and min A for safety    Time  4    Period  Weeks    Status  Not Met    Target Date  03/21/18      PT SHORT TERM GOAL #3   Title  Patient will stand for 1 minute with UE support to be able to perform safe transfers.     Baseline  Patient can stand for 10 seconds; Pt unable to stand without assistance, minAx1 for 30seconds; 03/12/18:     Time  8    Period  Weeks    Status  On-going    Target Date  04/18/18        PT Long Term Goals - 03/12/18 1527      PT LONG TERM GOAL #1   Title  Patient will be able to ambulate in parallel bars x 10 feet with min assist.    Baseline  ; Patient is ambulating wiht rollator 25 feet and min assist,     Time  12    Period  Weeks    Status  Achieved       PT LONG TERM GOAL #  2   Title  Patient will ambulate short distances ,25 feet, with RW and mod assist.     Baseline  : 08/22/17 patient is ambulating 15 feet with RW:09/12/17 25 feet with min assist and rollator, 10/25/17, 12/03/17 patient ambulates 100 feet with min assist and rollator, 01/03/18= mod assist for 100 feet with rollator; 19/2020 min-mod assist for 24f    Time  8    Period  Weeks    Status  On-going    Target Date  04/18/18      PT LONG TERM GOAL #3   Title  Patient will be able to ascend 1 step with min assist with LARD.    Baseline  01/03/18=max assist with HHA; 02/20/18: not tested but estimate max A due to sit<>Stand ability and weakness;     Time  12    Period  Weeks    Status  On-going    Target Date  04/18/18      PT LONG TERM GOAL #4   Title  Patient (> 672years old) will complete five times sit to stand test in < 15 seconds indicating an increased LE strength and improved balance.    Baseline  01/03/18= 57.45 sec with mod assist ; 02/21/2018 40 secs with min-modA, posterior lean present with initial standing balance1/28/20: not assessed;     Time  8    Period  Weeks    Status  On-going    Target Date  04/18/18      PT LONG TERM GOAL #5   Title  Patient will be able to perform sit to stand [facility]    Baseline  01/03/18 needs mod assist: 02/21/2018 needs min assist and verbal cues for anterior weight shift, scooting to edge of seat, LE positioning1/28/20: minA with mod VCs with rollator    Period  Weeks    Status  On-going    Target Date  04/18/18            Plan - 03/14/18 1434    Clinical Impression Statement  Patient tolerated session well; utilized NDT techniques to work on sit<>stand transfers and standing balance to reorient patient to midline; UIT trainerfor visual cues; At end of session, paitent able to ambulate 12 feet with rollator with CGA to min A; She does continue to have significant difficulty with turning. Patient would  benefit from additional skilled PT Intervention to improve strength, balance and gait safety;     Rehab Potential  Good    PT Frequency  2x / week    PT Duration  8 weeks    PT Treatment/Interventions  Therapeutic exercise;Therapeutic activities;Functional mobility training;Stair training;Balance training;Neuromuscular re-education;Patient/family education;Aquatic Therapy    PT Next Visit Plan  mobility training, strengthening, balanace training    PT Home Exercise Plan  HEP updated, administered (LAQ, seated marching, hip abduction with YTB, ankle/toe raises)    Consulted and Agree with Plan of Care  Patient       Patient will benefit from skilled therapeutic intervention in order to improve the following deficits and impairments:  Decreased balance, Decreased endurance, Decreased mobility, Decreased safety awareness, Decreased strength, Decreased activity tolerance  Visit Diagnosis: Muscle weakness (generalized)  Other lack of coordination  Difficulty in walking, not elsewhere classified     Problem List Patient Active Problem List   Diagnosis Date Noted  . Pain and swelling of right lower extremity 01/02/2017  . Parkinson's disease ([place] 01/02/2017    ,  PT, DPT 03/14/2018, 2:35 PM  [place] MAIN [facility] SERVICES [address] [place], [place], [address] Phone: [phone]   Fax:  [phone]  Name: Kelsey Owens MRN: [id number] Date of Birth: [birth year]

## 2018-03-15 NOTE — Therapy (Signed)
[place] MAIN [facility] SERVICES [address] [place], [place], [address] Phone: [phone]   Fax:  [phone]  Occupational Therapy Treatment  Patient Details  Name: Kelsey Owens MRN: [id number] Date of Birth: 10/28/1940 No data recorded  Encounter Date: 03/14/2018  OT End of Session - 03/14/18 1314    Visit Number  30    Number of Visits  59    Date for OT Re-Evaluation  04/24/18    Authorization Time Period  Progress report period starting 02/04/2018    OT Start Time  1302    OT Stop Time  1345    OT Time Calculation (min)  43 min    Activity Tolerance  Patient tolerated treatment well    Behavior During Therapy  [facility] for tasks assessed/performed       Past Medical History:  Diagnosis Date  . Arthritis   . Bursitis   . CAD (coronary artery disease)   . GERD (gastroesophageal reflux disease)   . Hyperlipidemia   . Hypertension   . Hypophonia   . Intercostal pain   . Parkinson disease ([place])   . Paroxysmal supraventricular tachycardia [facility])     Past Surgical History:  Procedure Laterality Date  . ABDOMINAL HYSTERECTOMY    . CHOLECYSTECTOMY    . DEEP BRAIN STIMULATOR PLACEMENT    . FLEXIBLE SIGMOIDOSCOPY N/A 12/19/2017   Procedure: FLEXIBLE SIGMOIDOSCOPY;  Surgeon: Manya Silvas, MD;  Location: [facility] ENDOSCOPY;  Service: Endoscopy;  Laterality: N/A;  . HEMORROIDECTOMY      There were no vitals filed for this visit.  Subjective Assessment - 03/14/18 1312    Subjective   Patient reports she had an injection in her shoulder on the right and pain is minimal 1/10.  reports she is blue green color blind.     Pertinent History  Pt. is a 78 y.o. female with a history of Parkinsons Disease. Pt. has a Deep Brain Stimulator, and has recently had it adjusted. Pt. has had multiple falls, and has a history of multiple wrist fractures.     Patient Stated Goals  To be as independent as possible.     Currently in Pain?  Yes    Pain  Score  1     Pain Location  Shoulder    Pain Orientation  Right    Pain Descriptors / Indicators  Aching    Pain Type  Acute pain    Multiple Pain Sites  No         Patient reports an injection in her right shoulder this past Tuesday and now has minimal pain in the right shoulder, 1/10.  Neuromuscular reeducation:   Patient seen for focus on picking up and manipulation of 100 pegboard pieces/Judy board to turn and flip to opposite side, cues to pick up with middle finger and thumb and turn with index finger.   She completed task with random order rather than moving along one line at a time.  Cues required at times to attend and look for white dot.  Patient seen for manipulation of small dowel sticks to pick up and place into grid with use of oppositional movements of the hands, moderate cues for alternating fingers when performing this task.   Patient seen for manipulation of small bulletin board magnets to pick up, place onto board in elevated plane of motion to encourage increased reaching patterns.     Patient requires cues for prehension patterns, oppositional movements with alternating fingers  and for manipulation skills, she will need additional focus in this area to become more proficient with fine motor coordination skills.                   OT Education - 03/14/18 1313    Education Details  fine motor coordination    Person(s) Educated  Patient    Methods  Explanation;Demonstration    Comprehension  Verbalized understanding;Returned demonstration          OT Long Term Goals - 01/30/18 1425      OT LONG TERM GOAL #1   Title  Pt. will improve BUE strength by 2 muscle grades to improve ADL, and IADL functioning.    Baseline  01/30/2018: Pt. conitnues to present with weakness in BUEs    Time  12    Period  Weeks    Status  On-going    Target Date  04/24/18      OT LONG TERM GOAL #2   Title  Pt. will increase Left grip strength by 5# to be able to open  jars, and containers.    Baseline  01/30/2018: Pt. is now able toopen jars, and containers.    Time  12    Period  Weeks    Status  Partially Met    Target Date  04/24/18      OT LONG TERM GOAL #3   Title  Pt. will improve left hand [facility] skills  to be able to button clothing efficiently    Baseline  01/30/2018: Pt. has difficulty manipulating buttons efficiently.    Time  12    Period  Weeks    Status  On-going    Target Date  04/24/18      OT LONG TERM GOAL #4   Title  Pt. will independently write one sentence efficiently with 75% legibility.    Baseline  01/30/2018: Pt. conitnues to present with decreased writing legibility.    Time  12    Period  Weeks    Status  On-going    Target Date  04/24/18      OT LONG TERM GOAL #5   Title  Pt. independently demonstrate work simplification strategies, and compensatory startegies for safe IADL tasks/home management tasks.         OT LONG TERM GOAL #6   Title  Pt. will independently demonstrate work simplification strategies, and compensatory startegies within the kitchen from w/c height.    Baseline  Continue    Time  12    Period  Weeks    Status  On-going    Target Date  04/24/18      OT LONG TERM GOAL #7   Title  Pt. will be able to complete toilet hygiene care with supervision.    Baseline  Pt. is unable to perform the task.    Time  12    Period  Weeks    Status  On-going    Target Date  04/24/18            Plan - 03/15/18 0909    Clinical Impression Statement  Patient requires cues for prehension patterns, oppositional movements with alternating fingers and for manipulation skills, she will need additional focus in this area to become more proficient with fine motor coordination skills.   Patient continues to progress towards goals and has decreased pain in the shoulder this week and able to engage in reaching patterns on the right.  Continue to work towards goals  to [employer] safety and independence in daily tasks.      Occupational Profile and client history currently impacting functional performance  Pt. resides with her husband who is a Theme park manager. Pt. has grown children.    Occupational performance deficits (Please refer to evaluation for details):  ADL's;IADL's    Rehab Potential  Good    Current Impairments/barriers affecting progress:  Positive Barriers: age, motivation, family support. Negative Barriers: multiple comorbidities, history of multiple falls.    OT Frequency  2x / week    OT Duration  12 weeks    OT Treatment/Interventions  Self-care/ADL training;Neuromuscular education;Therapeutic exercise;DME and/or AE instruction;Visual/perceptual remediation/compensation;Patient/family education;Therapeutic activities    Consulted and Agree with Plan of Care  Patient       Patient will benefit from skilled therapeutic intervention in order to improve the following deficits and impairments:  Abnormal gait, Decreased cognition, Pain, Decreased strength, Impaired UE functional use, Decreased activity tolerance, Decreased balance  Visit Diagnosis: Muscle weakness (generalized)  Other lack of coordination    Problem List Patient Active Problem List   Diagnosis Date Noted  . Pain and swelling of right lower extremity 01/02/2017  . Parkinson's disease ([place]) 01/02/2017   Sherrina Zaugg T Tomasita Morrow, OTR/L, CLT  Josette Shimabukuro 03/15/2018, 9:11 AM  [place] MAIN [facility] SERVICES [address] [place], [place], [address] Phone: [phone]   Fax:  [phone]  Name: Kelsey Owens MRN: [id number] Date of Birth: 07/31/1940

## 2018-03-18 ENCOUNTER — Ambulatory Visit: Payer: Medicare Other | Admitting: Occupational Therapy

## 2018-03-20 ENCOUNTER — Ambulatory Visit: Payer: Medicare Other | Admitting: Occupational Therapy

## 2018-03-20 ENCOUNTER — Ambulatory Visit: Payer: Medicare Other | Admitting: Physical Therapy

## 2018-03-25 ENCOUNTER — Ambulatory Visit: Payer: Medicare Other | Attending: Family Medicine | Admitting: Occupational Therapy

## 2018-03-25 ENCOUNTER — Encounter: Payer: Self-pay | Admitting: Occupational Therapy

## 2018-03-25 DIAGNOSIS — R262 Difficulty in walking, not elsewhere classified: Secondary | ICD-10-CM | POA: Diagnosis present

## 2018-03-25 DIAGNOSIS — M79604 Pain in right leg: Secondary | ICD-10-CM | POA: Insufficient documentation

## 2018-03-25 DIAGNOSIS — R490 Dysphonia: Secondary | ICD-10-CM | POA: Diagnosis not present

## 2018-03-25 DIAGNOSIS — M7989 Other specified soft tissue disorders: Secondary | ICD-10-CM | POA: Diagnosis present

## 2018-03-25 DIAGNOSIS — G20 Parkinson's disease: Secondary | ICD-10-CM | POA: Insufficient documentation

## 2018-03-25 DIAGNOSIS — M6281 Muscle weakness (generalized): Secondary | ICD-10-CM

## 2018-03-25 DIAGNOSIS — R278 Other lack of coordination: Secondary | ICD-10-CM | POA: Diagnosis present

## 2018-03-25 NOTE — Therapy (Signed)
[place] MAIN [facility] SERVICES [address] [place], [place], [address] Phone: [phone]   Fax:  [phone]  Occupational Therapy Treatment  Patient Details  Name: Kelsey Owens MRN: [id number] Date of Birth: 01-Aug-1940 No data recorded  Encounter Date: 03/25/2018  OT End of Session - 03/25/18 1534    Visit Number  31    Number of Visits  24    Date for OT Re-Evaluation  04/24/18    Authorization Time Period  Progress report period starting 02/04/2018    OT Start Time  1312    OT Stop Time  1345    OT Time Calculation (min)  33 min    Activity Tolerance  Patient tolerated treatment well    Behavior During Therapy  [facility] for tasks assessed/performed       Past Medical History:  Diagnosis Date  . Arthritis   . Bursitis   . CAD (coronary artery disease)   . GERD (gastroesophageal reflux disease)   . Hyperlipidemia   . Hypertension   . Hypophonia   . Intercostal pain   . Parkinson disease ([place])   . Paroxysmal supraventricular tachycardia [facility])     Past Surgical History:  Procedure Laterality Date  . ABDOMINAL HYSTERECTOMY    . CHOLECYSTECTOMY    . DEEP BRAIN STIMULATOR PLACEMENT    . FLEXIBLE SIGMOIDOSCOPY N/A 12/19/2017   Procedure: FLEXIBLE SIGMOIDOSCOPY;  Surgeon: Manya Silvas, MD;  Location: [facility] ENDOSCOPY;  Service: Endoscopy;  Laterality: N/A;  . HEMORROIDECTOMY      There were no vitals filed for this visit.  Subjective Assessment - 03/25/18 1532    Subjective   Pt. reports having had a fall in the bathroom at home.    Patient is accompained by:  Family member    Pertinent History  Pt. is a 78 y.o. female with a history of Parkinsons Disease. Pt. has a Deep Brain Stimulator, and has recently had it adjusted. Pt. has had multiple falls, and has a history of multiple wrist fractures.     Patient Stated Goals  To be as independent as possible.     Currently in Pain?  No/denies      OT TREATMENT    Neuro  muscular re-education:  Pt. worked on [facility] skills grasping 1/2" flat washers from a magnetic dish using her 2nd digit, and thumb. Pt. worked on placing the washers over the small horizontal dowel. The position of the dowel was changed to a vertical position. Pt. worked on holding the dowel with her left hand, and manipulating the washer with her right hand.  Pt. worked on grasping 2" sticks, and placing them through a foam pegboard. Pt. worked on removing the clips with red resistive clips. Pt. required support of the pegboard, and blocking to prevent the sticks from sliding through the bottom.  Pt. required visual cues.                        OT Education - 03/25/18 1534    Education Details  fine motor coordination    Person(s) Educated  Patient    Methods  Explanation;Demonstration    Comprehension  Verbalized understanding;Returned demonstration          OT Long Term Goals - 01/30/18 1425      OT LONG TERM GOAL #1   Title  Pt. will improve BUE strength by 2 muscle grades to improve ADL, and IADL functioning.  Baseline  01/30/2018: Pt. conitnues to present with weakness in BUEs    Time  12    Period  Weeks    Status  On-going    Target Date  04/24/18      OT LONG TERM GOAL #2   Title  Pt. will increase Left grip strength by 5# to be able to open jars, and containers.    Baseline  01/30/2018: Pt. is now able toopen jars, and containers.    Time  12    Period  Weeks    Status  Partially Met    Target Date  04/24/18      OT LONG TERM GOAL #3   Title  Pt. will improve left hand [facility] skills  to be able to button clothing efficiently    Baseline  01/30/2018: Pt. has difficulty manipulating buttons efficiently.    Time  12    Period  Weeks    Status  On-going    Target Date  04/24/18      OT LONG TERM GOAL #4   Title  Pt. will independently write one sentence efficiently with 75% legibility.    Baseline  01/30/2018: Pt. conitnues to present with decreased  writing legibility.    Time  12    Period  Weeks    Status  On-going    Target Date  04/24/18      OT LONG TERM GOAL #5   Title  Pt. independently demonstrate work simplification strategies, and compensatory startegies for safe IADL tasks/home management tasks.         OT LONG TERM GOAL #6   Title  Pt. will independently demonstrate work simplification strategies, and compensatory startegies within the kitchen from w/c height.    Baseline  Continue    Time  12    Period  Weeks    Status  On-going    Target Date  04/24/18      OT LONG TERM GOAL #7   Title  Pt. will be able to complete toilet hygiene care with supervision.    Baseline  Pt. is unable to perform the task.    Time  12    Period  Weeks    Status  On-going    Target Date  04/24/18            Plan - 03/25/18 1534    Clinical Impression Statement Pt. husband reports that the pt. had difficulty with her Parkinsons last week.  Pt. had a fall in the bathroom at home over the weekend. Pt. continues to present with limited motor control, and [facility] skills in order to be able to manipulate bvttons, open containers, open jars, write legibly, and perform toileting skills.    Occupational Profile and client history currently impacting functional performance  Pt. resides with her husband who is a Theme park manager. Pt. has grown children.    Occupational performance deficits (Please refer to evaluation for details):  ADL's;IADL's    Rehab Potential  Good    Current Impairments/barriers affecting progress:  Positive Barriers: age, motivation, family support. Negative Barriers: multiple comorbidities, history of multiple falls.    OT Frequency  2x / week    OT Duration  12 weeks    OT Treatment/Interventions  Self-care/ADL training;Neuromuscular education;Therapeutic exercise;DME and/or AE instruction;Visual/perceptual remediation/compensation;Patient/family education;Therapeutic activities    Clinical Decision Making  Several treatment  options, min-mod task modification necessary    Consulted and Agree with Plan of Care  Patient  Patient will benefit from skilled therapeutic intervention in order to improve the following deficits and impairments:  Abnormal gait, Decreased cognition, Pain, Decreased strength, Impaired UE functional use, Decreased activity tolerance, Decreased balance  Visit Diagnosis: Muscle weakness (generalized)  Other lack of coordination    Problem List Patient Active Problem List   Diagnosis Date Noted  . Pain and swelling of right lower extremity 01/02/2017  . Parkinson's disease ([place]) 01/02/2017    Harrel Carina, MS, OTR/L 03/25/2018, 3:43 PM  [place] MAIN [facility] SERVICES [address] [place], [place], [address] Phone: [phone]   Fax:  [phone]  Name: Lillias Difrancesco MRN: [id number] Date of Birth: 01/18/1941

## 2018-03-27 ENCOUNTER — Ambulatory Visit: Payer: Medicare Other | Admitting: Occupational Therapy

## 2018-03-27 ENCOUNTER — Ambulatory Visit: Payer: Medicare Other | Admitting: Physical Therapy

## 2018-03-27 ENCOUNTER — Encounter: Payer: Self-pay | Admitting: Physical Therapy

## 2018-03-27 DIAGNOSIS — R278 Other lack of coordination: Secondary | ICD-10-CM

## 2018-03-27 DIAGNOSIS — R262 Difficulty in walking, not elsewhere classified: Secondary | ICD-10-CM

## 2018-03-27 DIAGNOSIS — M6281 Muscle weakness (generalized): Secondary | ICD-10-CM

## 2018-03-27 DIAGNOSIS — G20 Parkinson's disease: Secondary | ICD-10-CM

## 2018-03-27 NOTE — Therapy (Signed)
[place] MAIN [facility] SERVICES [address] [place], [place], [address] Phone: [phone]   Fax:  [phone]  Physical Therapy Treatment  Patient Details  Name: Kelsey Owens MRN: [id number] Date of Birth: 11-06-1940 Referring Provider (PT): Jennelle Human   Encounter Date: 03/27/2018  PT End of Session - 03/27/18 1309    Visit Number  42    Number of Visits  50    Date for PT Re-Evaluation  04/18/18    Authorization Type  goals last assessed 03/12/18    PT Start Time  1302    PT Stop Time  1345    PT Time Calculation (min)  43 min    Equipment Utilized During Treatment  Gait belt    Activity Tolerance  Patient tolerated treatment well    Behavior During Therapy  WFL for tasks assessed/performed       Past Medical History:  Diagnosis Date  . Arthritis   . Bursitis   . CAD (coronary artery disease)   . GERD (gastroesophageal reflux disease)   . Hyperlipidemia   . Hypertension   . Hypophonia   . Intercostal pain   . Parkinson disease ([place])   . Paroxysmal supraventricular tachycardia [facility])     Past Surgical History:  Procedure Laterality Date  . ABDOMINAL HYSTERECTOMY    . CHOLECYSTECTOMY    . DEEP BRAIN STIMULATOR PLACEMENT    . FLEXIBLE SIGMOIDOSCOPY N/A 12/19/2017   Procedure: FLEXIBLE SIGMOIDOSCOPY;  Surgeon: Manya Silvas, MD;  Location: [facility] ENDOSCOPY;  Service: Endoscopy;  Laterality: N/A;  . HEMORROIDECTOMY      There were no vitals filed for this visit.  Subjective Assessment - 03/27/18 1307    Subjective  Patient reports doing well; She denies any pain; She reports still having trouble with transfers; Denies any new falls;     Patient is accompained by:  Family member    Pertinent History  Patient has had parkinsons disease for the last 20 years. Her mobility has been getting worse after she had a left radius fx.  She was walking with RW in 10/18.  Patient has worseing loss of balance beginning 10/18.  She  had HHPT 2 x week for 2 months beginining after hospitilization in Dec 2018.     Currently in Pain?  No/denies    Pain Onset  More than a month ago    Multiple Pain Sites  No          TREATMENT:   Requires min A for stand pivot transfer with Rollator from Wheelchair<>mat table with mod VCs for hand placement and positioning for safety;   Sitting on mat table: Utilized NDT techniques to address mechanics with sit<>stand transfers; Seated forward weight shift with therapist sitting to patient's right side for proprioceptive feedback and balance: -forward weight shift x5 reps -forward weight shift with weight shift over feet lifting bottom off table x5 reps -progressed to PT instructed patient in sit<>stand transfers starting with elevated mat table, with mod-min A and 1 HHA to facilitate forward weight shift for better transfer ability:                 X5 reps with cues for forward weight shift     Standing: Side/side weight shift with 1 HHA with feet apart x5 reps to challenge stance control and reorient patient to midline; Unsupported, heel raises x10 reps with 1 HHA and mod-min A for balance control; 1 HHA mini squat x10 reps with  min A for balance and cues for neutral posture;  Standing: With mod-min A for neutral posture, reaching with RUE on SAEBO system moving 4 balls from bottom to next level (green, red) to middle rung with 1 HHA and mod VCs for weight shift and reaching to each level; With prolonged standing, patient often starts leaning backwards due to fatigue requiring cues for forward weight shift;    At end of session, patient ambulated 60 feet with rollator min A +2 for wheelchair follow, exhibiting improved erect posture with less posterior lean; She does require min VCs to increase step length and to min A for walker management to avoid letting walker get ahead of her.    Patient reports moderate fatigue at end of session; She tolerated well without discomfort.  Patient able to exhibit improved balance and gait safety following gait exercise. She also is able to transfer better with less assistance with cues for forward weight shift and hand placement.                      PT Education - 03/27/18 1309    Education Details  transfers/strengthening;     Person(s) Educated  Patient    Methods  Explanation;Verbal cues    Comprehension  Verbalized understanding;Returned demonstration;Verbal cues required;Need further instruction       PT Short Term Goals - 03/12/18 1527      PT SHORT TERM GOAL #1   Title  Patient will be independent in home exercise program to improve strength/mobility for better functional independence with ADLs.    Time  4    Period  Weeks    Status  Achieved      PT SHORT TERM GOAL #2   Title  Patient will perform sit to stand with correct hand placement and without support on the back of the chair with RW  and SBA.     Baseline  Patient needs min assist and needs min assist for correct hand placement. MinAx1 and minimum verbal cues for handplacement with ea attempt.; 03/12/18: requires mod VCs for correct hand placement, and min A for safety    Time  4    Period  Weeks    Status  Not Met    Target Date  03/21/18      PT SHORT TERM GOAL #3   Title  Patient will stand for 1 minute with UE support to be able to perform safe transfers.     Baseline  Patient can stand for 10 seconds; Pt unable to stand without assistance, minAx1 for 30seconds; 03/12/18:     Time  8    Period  Weeks    Status  On-going    Target Date  04/18/18        PT Long Term Goals - 03/12/18 1527      PT LONG TERM GOAL #1   Title  Patient will be able to ambulate in parallel bars x 10 feet with min assist.    Baseline  ; Patient is ambulating wiht rollator 25 feet and min assist,     Time  12    Period  Weeks    Status  Achieved      PT LONG TERM GOAL #2   Title  Patient will ambulate short distances ,25 feet, with RW and mod  assist.     Baseline  : 08/22/17 patient is ambulating 15 feet with RW:09/12/17 25 feet with min assist and rollator, 10/25/17, 12/03/17  patient ambulates 100 feet with min assist and rollator, 01/03/18= mod assist for 100 feet with rollator; 19/2020 min-mod assist for 56f    Time  8    Period  Weeks    Status  On-going    Target Date  04/18/18      PT LONG TERM GOAL #3   Title  Patient will be able to ascend 1 step with min assist with LARD.    Baseline  01/03/18=max assist with HHA; 02/20/18: not tested but estimate max A due to sit<>Stand ability and weakness;     Time  12    Period  Weeks    Status  On-going    Target Date  04/18/18      PT LONG TERM GOAL #4   Title  Patient (> 681years old) will complete five times sit to stand test in < 15 seconds indicating an increased LE strength and improved balance.    Baseline  01/03/18= 57.45 sec with mod assist ; 02/21/2018 40 secs with min-modA, posterior lean present with initial standing balance1/28/20: not assessed;     Time  8    Period  Weeks    Status  On-going    Target Date  04/18/18      PT LONG TERM GOAL #5   Title  Patient will be able to perform sit to stand [facility]    Baseline  01/03/18 needs mod assist: 02/21/2018 needs min assist and verbal cues for anterior weight shift, scooting to edge of seat, LE positioning1/28/20: minA with mod VCs with rollator    Period  Weeks    Status  On-going    Target Date  04/18/18            Plan - 03/28/18 0754    Clinical Impression Statement  Patient tolerated session well. She responds well to mirror and visual/verbal cues for better weight shift and postural control. Following standing balance activities, patient able to exhibit better balance with gait being able to progress gait distance to 60 feet with rollator. Patient does fatigue quickly requiring short sitting rest breaks. She would benefit from additional skilled PT Intervention to improve strength, balance and  gait safety;     Rehab Potential  Good    PT Frequency  2x / week    PT Duration  8 weeks    PT Treatment/Interventions  Therapeutic exercise;Therapeutic activities;Functional mobility training;Stair training;Balance training;Neuromuscular re-education;Patient/family education;Aquatic Therapy    PT Next Visit Plan  mobility training, strengthening, balanace training    PT Home Exercise Plan  HEP updated, administered (LAQ, seated marching, hip abduction with YTB, ankle/toe raises)    Consulted and Agree with Plan of Care  Patient       Patient will benefit from skilled therapeutic intervention in order to improve the following deficits and impairments:  Decreased balance, Decreased endurance, Decreased mobility, Decreased safety awareness, Decreased strength, Decreased activity tolerance  Visit Diagnosis: Muscle weakness (generalized)  Other lack of coordination  Difficulty in walking, not elsewhere classified     Problem List Patient Active Problem List   Diagnosis Date Noted  . Pain and swelling of right lower extremity 01/02/2017  . Parkinson's disease ([place] 01/02/2017    , PT, DPT 03/28/2018, 7:56 AM  [place] [facility] [address] [address]Phone: [phone]  Fax:  [phone] Name: IEstefani BatesonMRN: [id number] of Birth: 2Jul 17, 1942

## 2018-03-30 ENCOUNTER — Encounter: Payer: Self-pay | Admitting: Occupational Therapy

## 2018-03-30 NOTE — Therapy (Signed)
[place] MAIN [facility] SERVICES [address] [place], [place], [address] Phone: [phone]   Fax:  [phone]  Occupational Therapy Treatment  Patient Details  Name: Kelsey Owens MRN: [id number] Date of Birth: 1941/02/04 No data recorded  Encounter Date: 03/27/2018  OT End of Session - 03/30/18 0947    Visit Number  32    Number of Visits  56    Date for OT Re-Evaluation  04/24/18    Authorization Time Period  Progress report period starting 03/14/2018    OT Start Time  1406    OT Stop Time  1450    OT Time Calculation (min)  44 min    Activity Tolerance  Patient tolerated treatment well    Behavior During Therapy  [facility] for tasks assessed/performed       Past Medical History:  Diagnosis Date  . Arthritis   . Bursitis   . CAD (coronary artery disease)   . GERD (gastroesophageal reflux disease)   . Hyperlipidemia   . Hypertension   . Hypophonia   . Intercostal pain   . Parkinson disease ([place])   . Paroxysmal supraventricular tachycardia [facility])     Past Surgical History:  Procedure Laterality Date  . ABDOMINAL HYSTERECTOMY    . CHOLECYSTECTOMY    . DEEP BRAIN STIMULATOR PLACEMENT    . FLEXIBLE SIGMOIDOSCOPY N/A 12/19/2017   Procedure: FLEXIBLE SIGMOIDOSCOPY;  Surgeon: Manya Silvas, MD;  Location: [facility] ENDOSCOPY;  Service: Endoscopy;  Laterality: N/A;  . HEMORROIDECTOMY      There were no vitals filed for this visit.  Subjective Assessment - 03/30/18 0945    Subjective   Patient reports she would like to work on making a card for her husband for Valentine's Day    Pertinent History  Pt. is a 78 y.o. female with a history of Parkinsons Disease. Pt. has a Deep Brain Stimulator, and has recently had it adjusted. Pt. has had multiple falls, and has a history of multiple wrist fractures.     Patient Stated Goals  To be as independent as possible.     Currently in Pain?  No/denies    Pain Score  0-No pain          Therapeutic Activity: Patient was seen this date for more complex task of card making for her husband for Valentine's Day. Pt. was given choices regarding Design options and was able to come up with a unique design of her own with attempts to cut out the letters to spell the word LOVE.  She attempted several times to cut out letters without a template and was unsuccessful.  Task was graded with therapist drawing out letters and pt attempting to cut out letters with use of loop scissors.  She demonstrated moderate difficulty, was able to respond to cues to cut out lines that were straight and therapist assisted with cutting out curves in the letters.  Patient demonstrated ability to choose colors in which she preferred and worked towards folding paper with bilateral hand use for making card. Required cues for managing scissors and position of scissors in regards to paper and during cutting exercise. Use of jumbo glue stick in right hand with cues for thoroughness of application to place letters onto card, occasional guiding from therapist.  Focused on handwriting skills to be able to write out message in her card.  Handwriting with fair legibility.  Patient demonstrated some confusion and possible preservation of task when therapist requested patient  to write her name , her response was, "my full name?" but then proceeded to write "I love you" rather than her name.   Patient was pleased with the outcome of her project this date but required increased time and grading of task to be able to perform the task in its entirety during treatment session.  Some issues with processing of information today with handwriting as noted above .  Patient also felt confident she could cut out letters or a heart using freeform method but was not able to demonstrate.  Moderate difficulty with use of scissors for cutting especially with curved lines and required assist from therapist.  Continue to work towards improving hand skills  and increasing independence in daily tasks.                    OT Education - 03/30/18 0946    Education Details  use of loop scissors, tracing, cutting, handwriting    Person(s) Educated  Patient    Methods  Explanation;Demonstration    Comprehension  Verbalized understanding;Returned demonstration          OT Long Term Goals - 01/30/18 1425      OT LONG TERM GOAL #1   Title  Pt. will improve BUE strength by 2 muscle grades to improve ADL, and IADL functioning.    Baseline  01/30/2018: Pt. conitnues to present with weakness in BUEs    Time  12    Period  Weeks    Status  On-going    Target Date  04/24/18      OT LONG TERM GOAL #2   Title  Pt. will increase Left grip strength by 5# to be able to open jars, and containers.    Baseline  01/30/2018: Pt. is now able toopen jars, and containers.    Time  12    Period  Weeks    Status  Partially Met    Target Date  04/24/18      OT LONG TERM GOAL #3   Title  Pt. will improve left hand [facility] skills  to be able to button clothing efficiently    Baseline  01/30/2018: Pt. has difficulty manipulating buttons efficiently.    Time  12    Period  Weeks    Status  On-going    Target Date  04/24/18      OT LONG TERM GOAL #4   Title  Pt. will independently write one sentence efficiently with 75% legibility.    Baseline  01/30/2018: Pt. conitnues to present with decreased writing legibility.    Time  12    Period  Weeks    Status  On-going    Target Date  04/24/18      OT LONG TERM GOAL #5   Title  Pt. independently demonstrate work simplification strategies, and compensatory startegies for safe IADL tasks/home management tasks.         OT LONG TERM GOAL #6   Title  Pt. will independently demonstrate work simplification strategies, and compensatory startegies within the kitchen from w/c height.    Baseline  Continue    Time  12    Period  Weeks    Status  On-going    Target Date  04/24/18      OT LONG TERM GOAL  #7   Title  Pt. will be able to complete toilet hygiene care with supervision.    Baseline  Pt. is unable to perform the task.  Time  12    Period  Weeks    Status  On-going    Target Date  04/24/18            Plan - 03/30/18 0947    Clinical Impression Statement  Patient was pleased with the outcome of her project this date but required increased time and grading of task to be able to perform the task in its entirety during treatment session.  Some issues with processing of information today with handwriting as noted above .  Patient also felt confident she could cut out letters or a heart using freeform method but was not able to demonstrate.  Moderate difficulty with use of scissors for cutting especially with curved lines and required assist from therapist.  Continue to work towards improving hand skills and increasing independence in daily tasks.     Occupational Profile and client history currently impacting functional performance  Pt. resides with her husband who is a Theme park manager. Pt. has grown children.    Occupational performance deficits (Please refer to evaluation for details):  ADL's;IADL's    Rehab Potential  Good    Current Impairments/barriers affecting progress:  Positive Barriers: age, motivation, family support. Negative Barriers: multiple comorbidities, history of multiple falls.    OT Frequency  2x / week    OT Duration  12 weeks    OT Treatment/Interventions  Self-care/ADL training;Neuromuscular education;Therapeutic exercise;DME and/or AE instruction;Visual/perceptual remediation/compensation;Patient/family education;Therapeutic activities    Consulted and Agree with Plan of Care  Patient       Patient will benefit from skilled therapeutic intervention in order to improve the following deficits and impairments:  Abnormal gait, Decreased cognition, Pain, Decreased strength, Impaired UE functional use, Decreased activity tolerance, Decreased balance  Visit  Diagnosis: Muscle weakness (generalized)  Other lack of coordination  Parkinson's disease [facility])    Problem List Patient Active Problem List   Diagnosis Date Noted  . Pain and swelling of right lower extremity 01/02/2017  . Parkinson's disease [facility]) 01/02/2017   Kelsey Owens, OTR/L, CLT  Kelsey Owens 03/30/2018, 4:38 PM  [place] MAIN [facility] SERVICES [address] [place], [place], [address] Phone: [phone]   Fax:  [phone]  Name: Kelsey Owens MRN: [id number] Date of Birth: [birth year]

## 2018-04-01 ENCOUNTER — Encounter: Payer: Self-pay | Admitting: Occupational Therapy

## 2018-04-01 ENCOUNTER — Ambulatory Visit: Payer: Medicare Other | Admitting: Physical Therapy

## 2018-04-01 ENCOUNTER — Ambulatory Visit: Payer: Medicare Other | Admitting: Occupational Therapy

## 2018-04-01 DIAGNOSIS — M6281 Muscle weakness (generalized): Secondary | ICD-10-CM

## 2018-04-01 DIAGNOSIS — G20 Parkinson's disease: Secondary | ICD-10-CM

## 2018-04-01 DIAGNOSIS — R278 Other lack of coordination: Secondary | ICD-10-CM

## 2018-04-01 DIAGNOSIS — G20A1 Parkinson's disease without dyskinesia, without mention of fluctuations: Secondary | ICD-10-CM

## 2018-04-01 DIAGNOSIS — M79604 Pain in right leg: Secondary | ICD-10-CM

## 2018-04-01 DIAGNOSIS — R262 Difficulty in walking, not elsewhere classified: Secondary | ICD-10-CM

## 2018-04-01 DIAGNOSIS — M7989 Other specified soft tissue disorders: Secondary | ICD-10-CM

## 2018-04-01 NOTE — Therapy (Signed)
[place] MAIN [facility] SERVICES [address] [place], [place], [address] Phone: [phone]   Fax:  [phone]  Physical Therapy Treatment  Patient Details  Name: Kelsey Owens MRN: [id number] Date of Birth: [birth year] Referring Provider (PT): Jennelle Human   Encounter Date: 04/01/2018  PT End of Session - 04/01/18 1312    Visit Number  43    Number of Visits  50    Date for PT Re-Evaluation  04/18/18    Authorization Type  goals last assessed 03/12/18    PT Start Time  1305    PT Stop Time  1344    PT Time Calculation (min)  39 min    Activity Tolerance  Patient tolerated treatment well;Patient limited by fatigue    Behavior During Therapy  [facility] for tasks assessed/performed       Past Medical History:  Diagnosis Date  . Arthritis   . Bursitis   . CAD (coronary artery disease)   . GERD (gastroesophageal reflux disease)   . Hyperlipidemia   . Hypertension   . Hypophonia   . Intercostal pain   . Parkinson disease ([place])   . Paroxysmal supraventricular tachycardia [facility])     Past Surgical History:  Procedure Laterality Date  . ABDOMINAL HYSTERECTOMY    . CHOLECYSTECTOMY    . DEEP BRAIN STIMULATOR PLACEMENT    . FLEXIBLE SIGMOIDOSCOPY N/A 12/19/2017   Procedure: FLEXIBLE SIGMOIDOSCOPY;  Surgeon: Manya Silvas, MD;  Location: [facility] ENDOSCOPY;  Service: Endoscopy;  Laterality: N/A;  . HEMORROIDECTOMY      There were no vitals filed for this visit.  Subjective Assessment - 04/01/18 1311    Subjective  Pt doing well today. No updates to meds. Pt reports no falls or balance trouble over the weekend.     Pertinent History  Patient has had parkinsons disease for the last 20 years. Her mobility has been getting worse after she had a left radius fx.  She was walking with RW in 10/18.  Patient has worseing loss of balance beginning 10/18.  She had HHPT 2 x week for 2 months beginining after hospitilization in Dec 2018.     Currently in  Pain?  No/denies          TREATMENT: Requires min A for stand pivot transfer with Rollator from Wheelchair<>mat table with mod VCs for hand placement and positioning for safety;   Sitting on mat table: Seated forward weight shift with therapist sitting to patient's right side for proprioceptive feedback and balance: -forward weight shift x10 reps (BUE reach to soccerball between feet (supervision level)  -forward weight shift x10 reps (BUE reach to 1lb dumbbell between feet (supervision level)  -Seated hip extension from elevated plinth, hands on ball at floor 2x3 -Seated hip extension from elevated plinth, hands on 12" stool at floor 2x3 (mina hands at knee and pelvis to cue and facilitate)  -STS from elevated plinth c RW, 2x5, VC for TKE, upright posture causes retroAMB which is somewhat improved with VC for plantarflexion           PT Short Term Goals - 03/12/18 1527      PT SHORT TERM GOAL #1   Title  Patient will be independent in home exercise program to improve strength/mobility for better functional independence with ADLs.    Time  4    Period  Weeks    Status  Achieved      PT SHORT TERM GOAL #2  Title  Patient will perform sit to stand with correct hand placement and without support on the back of the chair with RW  and SBA.     Baseline  Patient needs min assist and needs min assist for correct hand placement. MinAx1 and minimum verbal cues for handplacement with ea attempt.; 03/12/18: requires mod VCs for correct hand placement, and min A for safety    Time  4    Period  Weeks    Status  Not Met    Target Date  03/21/18      PT SHORT TERM GOAL #3   Title  Patient will stand for 1 minute with UE support to be able to perform safe transfers.     Baseline  Patient can stand for 10 seconds; Pt unable to stand without assistance, minAx1 for 30seconds; 03/12/18:     Time  8    Period  Weeks    Status  On-going    Target Date  04/18/18        PT Long Term Goals -  03/12/18 1527      PT LONG TERM GOAL #1   Title  Patient will be able to ambulate in parallel bars x 10 feet with min assist.    Baseline  ; Patient is ambulating wiht rollator 25 feet and min assist,     Time  12    Period  Weeks    Status  Achieved      PT LONG TERM GOAL #2   Title  Patient will ambulate short distances ,25 feet, with RW and mod assist.     Baseline  : 08/22/17 patient is ambulating 15 feet with RW:09/12/17 25 feet with min assist and rollator, 10/25/17, 12/03/17 patient ambulates 100 feet with min assist and rollator, 01/03/18= mod assist for 100 feet with rollator; 19/2020 min-mod assist for 69f    Time  8    Period  Weeks    Status  On-going    Target Date  04/18/18      PT LONG TERM GOAL #3   Title  Patient will be able to ascend 1 step with min assist with LARD.    Baseline  01/03/18=max assist with HHA; 02/20/18: not tested but estimate max A due to sit<>Stand ability and weakness;     Time  12    Period  Weeks    Status  On-going    Target Date  04/18/18      PT LONG TERM GOAL #4   Title  Patient (> 665years old) will complete five times sit to stand test in < 15 seconds indicating an increased LE strength and improved balance.    Baseline  01/03/18= 57.45 sec with mod assist ; 02/21/2018 40 secs with min-modA, posterior lean present with initial standing balance1/28/20: not assessed;     Time  8    Period  Weeks    Status  On-going    Target Date  04/18/18      PT LONG TERM GOAL #5   Title  Patient will be able to perform sit to stand transfer [facility]    Baseline  01/03/18 needs mod assist: 02/21/2018 needs min assist and verbal cues for anterior weight shift, scooting to edge of seat, LE positioning1/28/20: minA with mod VCs with rollator    Period  Weeks    Status  On-going    Target Date  04/18/18  Plan - 04/01/18 1321    Clinical Impression Statement  Continued with weight shifting and balance exercises to facility more hip  extension in transfers training, pt remains very quads dominant in weak in hip extension. Pt may benefit from deep angle hip extension strengthenin on leg press in pfuture visits.    Rehab Potential  Good    PT Frequency  2x / week    PT Duration  8 weeks    PT Treatment/Interventions  Therapeutic exercise;Therapeutic activities;Functional mobility training;Stair training;Balance training;Neuromuscular re-education;Patient/family education;Aquatic Therapy    PT Next Visit Plan  mobility training, strengthening, balanace training    PT Home Exercise Plan  HEP updated, administered (LAQ, seated marching, hip abduction with YTB, ankle/toe raises)    Consulted and Agree with Plan of Care  Patient       Patient will benefit from skilled therapeutic intervention in order to improve the following deficits and impairments:  Decreased balance, Decreased endurance, Decreased mobility, Decreased safety awareness, Decreased strength, Decreased activity tolerance  Visit Diagnosis: Muscle weakness (generalized)  Other lack of coordination  Parkinson's disease ([place])  Difficulty in walking, not elsewhere classified  Pain and swelling of right lower extremity     Problem List Patient Active Problem List   Diagnosis Date Noted  . Pain and swelling of right lower extremity 01/02/2017  . Parkinson's disease ([place]) 01/02/2017    Breaunna Gottlieb C 04/01/2018, 1:46 PM  1:47 PM, 04/01/18 Etta Grandchild, PT, DPT Physical Therapist - [facility]  Outpatient Physical Therapy- [place] MAIN [facility] SERVICES [address] [place], [place], [address] Phone: [phone]   Fax:  [phone]  Name: Kelsey Owens MRN: [id number] Date of Birth: August 02, 1940

## 2018-04-01 NOTE — Therapy (Signed)
[name] [facility] SERVICES [address] [place], [place], [address] Phone: [phone]   Fax:  [phone]  Occupational Therapy Treatment  Patient Details  Name: Kelsey Owens MRN: [id number] Date of Birth: 1940-03-19 No data recorded  Encounter Date: 04/01/2018  OT End of Session - 04/01/18 1427    Visit Number  33    Number of Visits  37    Date for OT Re-Evaluation  04/24/18    Authorization Time Period  Progress report period starting 03/14/2018    OT Start Time  1400    OT Stop Time  1445    OT Time Calculation (min)  45 min    Activity Tolerance  Patient tolerated treatment well    Behavior During Therapy  [facility] for tasks assessed/performed       Past Medical History:  Diagnosis Date  . Arthritis   . Bursitis   . CAD (coronary artery disease)   . GERD (gastroesophageal reflux disease)   . Hyperlipidemia   . Hypertension   . Hypophonia   . Intercostal pain   . Parkinson disease ([place])   . Paroxysmal supraventricular tachycardia [facility])     Past Surgical History:  Procedure Laterality Date  . ABDOMINAL HYSTERECTOMY    . CHOLECYSTECTOMY    . DEEP BRAIN STIMULATOR PLACEMENT    . FLEXIBLE SIGMOIDOSCOPY N/A 12/19/2017   Procedure: FLEXIBLE SIGMOIDOSCOPY;  Surgeon: Manya Silvas, MD;  Location: [facility] ENDOSCOPY;  Service: Endoscopy;  Laterality: N/A;  . HEMORROIDECTOMY      There were no vitals filed for this visit.  Subjective Assessment - 04/01/18 1426    Subjective   Patient denies pain and eager to work with therapy today.    Patient is accompained by:  Family member    Pertinent History  Pt. is a 78 y.o. female with a history of Parkinsons Disease. Pt. has a Deep Brain Stimulator, and has recently had it adjusted. Pt. has had multiple falls, and has a history of multiple wrist fractures.     Patient Stated Goals  To be as independent as possible.     Currently in Pain?  No/denies       Treatment:    Therapeutic Activity:  Pt seen for fine motor coordination activity with initial grooved pegboard with pt demonstrating significant difficulty placing and turning grooved peg, requiring initial verbal cues to turn and ultimately hand over hand assist to complete. With 100 pegboard/Judy board with larger colored pegs, pt was better able to use fingertips in 3 point pinch to grasp pegs and manipulate to turn and place back in holes. Pt required intermittent and increasing cues to follow instructions for visual scanning L to R and flip over only pegs with a white dot. Pt demonstrated difficulty with visually scanning and remaining on 1 line at a time, particularly with a break in pegs, requiring modifications by therapist to perform.    Pt seen for 2-3 step card sorting activity to sort cards by color. When initiating the task after instructions, she began sorting by suit, requiring intermittent cues throughout the task to review instructions and teach back cues to improve recall and carryover of initial instructions.   Pt seen for knotting/string tying activity. After initial instruction, pt able to immediately recall instructions, but required additional time to perform, modifications to loosen knots, and intermittent verbal cues for instructions.   Self-care:  Pt seen for check writing activity to improve legibility for handwriting tasks. Pt  started writing her first name using legible letters and maintaining good spacing on the line for both the name and date. As pt progressed with task, her handwriting became smaller and less legible. Additionally, pt wrote her first name in place of the dollar amount and written dollar amount [name] and 15 cents").                       OT Long Term Goals - 01/30/18 1425      OT LONG TERM GOAL #1   Title  Pt. will improve BUE strength by 2 muscle grades to improve ADL, and IADL functioning.    Baseline  01/30/2018: Pt. conitnues to present  with weakness in BUEs    Time  12    Period  Weeks    Status  On-going    Target Date  04/24/18      OT LONG TERM GOAL #2   Title  Pt. will increase Left grip strength by 5# to be able to open jars, and containers.    Baseline  01/30/2018: Pt. is now able toopen jars, and containers.    Time  12    Period  Weeks    Status  Partially Met    Target Date  04/24/18      OT LONG TERM GOAL #3   Title  Pt. will improve left hand [facility] skills  to be able to button clothing efficiently    Baseline  01/30/2018: Pt. has difficulty manipulating buttons efficiently.    Time  12    Period  Weeks    Status  On-going    Target Date  04/24/18      OT LONG TERM GOAL #4   Title  Pt. will independently write one sentence efficiently with 75% legibility.    Baseline  01/30/2018: Pt. conitnues to present with decreased writing legibility.    Time  12    Period  Weeks    Status  On-going    Target Date  04/24/18      OT LONG TERM GOAL #5   Title  Pt. independently demonstrate work simplification strategies, and compensatory startegies for safe IADL tasks/home management tasks.         OT LONG TERM GOAL #6   Title  Pt. will independently demonstrate work simplification strategies, and compensatory startegies within the kitchen from w/c height.    Baseline  Continue    Time  12    Period  Weeks    Status  On-going    Target Date  04/24/18      OT LONG TERM GOAL #7   Title  Pt. will be able to complete toilet hygiene care with supervision.    Baseline  Pt. is unable to perform the task.    Time  12    Period  Weeks    Status  On-going    Target Date  04/24/18            Plan - 04/01/18 1428    Clinical Impression Statement  Patient shared with therapist that she had done her handwriting homework but reported that she wasn't sure that she "did a good job" with it. Patient required increased time and grading of various tasks in order to perform during treatment session. Patient  demonstrated difficulty with processing of instructions requiring additional cues throughout tasks. Patient continues to benefit from skilled OT services in order to maximize fine motor skills and independence with daily  tasks.     Occupational Profile and client history currently impacting functional performance  Pt. resides with her husband who is a Theme park manager. Pt. has grown children.    Occupational performance deficits (Please refer to evaluation for details):  ADL's;IADL's    Rehab Potential  Good    Current Impairments/barriers affecting progress:  Positive Barriers: age, motivation, family support. Negative Barriers: multiple comorbidities, history of multiple falls.    OT Frequency  2x / week    OT Duration  12 weeks    OT Treatment/Interventions  Self-care/ADL training;Neuromuscular education;Therapeutic exercise;DME and/or AE instruction;Visual/perceptual remediation/compensation;Patient/family education;Therapeutic activities    Clinical Decision Making  Several treatment options, min-mod task modification necessary    Consulted and Agree with Plan of Care  Patient       Patient will benefit from skilled therapeutic intervention in order to improve the following deficits and impairments:  Abnormal gait, Decreased cognition, Pain, Decreased strength, Impaired UE functional use, Decreased activity tolerance, Decreased balance  Visit Diagnosis: Muscle weakness (generalized)  Other lack of coordination  Parkinson's disease [facility])    Problem List Patient Active Problem List   Diagnosis Date Noted  . Pain and swelling of right lower extremity 01/02/2017  . Parkinson's disease ([place]) 01/02/2017   Hadassah Rana T Tomasita Morrow, OTR/L, CLT  Jamis Kryder 04/01/2018, 4:03 PM  [place] MAIN [facility] SERVICES [address] [place], [place], [address] Phone: [phone]   Fax:  [phone]  Name: Kelsey Owens MRN: [id number] Date of Birth: [birth year]

## 2018-04-03 ENCOUNTER — Ambulatory Visit: Payer: Medicare Other | Admitting: Occupational Therapy

## 2018-04-03 ENCOUNTER — Encounter: Payer: Self-pay | Admitting: Occupational Therapy

## 2018-04-03 ENCOUNTER — Ambulatory Visit: Payer: Medicare Other | Admitting: Physical Therapy

## 2018-04-03 DIAGNOSIS — M7989 Other specified soft tissue disorders: Secondary | ICD-10-CM

## 2018-04-03 DIAGNOSIS — R262 Difficulty in walking, not elsewhere classified: Secondary | ICD-10-CM

## 2018-04-03 DIAGNOSIS — G20 Parkinson's disease: Secondary | ICD-10-CM

## 2018-04-03 DIAGNOSIS — G20A1 Parkinson's disease without dyskinesia, without mention of fluctuations: Secondary | ICD-10-CM

## 2018-04-03 DIAGNOSIS — M79604 Pain in right leg: Secondary | ICD-10-CM

## 2018-04-03 DIAGNOSIS — M6281 Muscle weakness (generalized): Secondary | ICD-10-CM

## 2018-04-03 DIAGNOSIS — R278 Other lack of coordination: Secondary | ICD-10-CM

## 2018-04-03 NOTE — Therapy (Signed)
[place] MAIN [facility] SERVICES [address] [place], [place], [address] Phone: [phone]   Fax:  [phone]  Physical Therapy Treatment  Patient Details  Name: Kelsey Owens MRN: [id number] Date of Birth: September 06, 1940 Referring Provider (PT): Jennelle Human   Encounter Date: 04/03/2018  PT End of Session - 04/03/18 1316    Visit Number  44    Number of Visits  50    Date for PT Re-Evaluation  04/18/18    Authorization Type  goals last assessed 03/12/18    PT Start Time  1303    PT Stop Time  1343    PT Time Calculation (min)  40 min    Activity Tolerance  Patient tolerated treatment well    Behavior During Therapy  [facility] for tasks assessed/performed       Past Medical History:  Diagnosis Date  . Arthritis   . Bursitis   . CAD (coronary artery disease)   . GERD (gastroesophageal reflux disease)   . Hyperlipidemia   . Hypertension   . Hypophonia   . Intercostal pain   . Parkinson disease ([place])   . Paroxysmal supraventricular tachycardia [facility])     Past Surgical History:  Procedure Laterality Date  . ABDOMINAL HYSTERECTOMY    . CHOLECYSTECTOMY    . DEEP BRAIN STIMULATOR PLACEMENT    . FLEXIBLE SIGMOIDOSCOPY N/A 12/19/2017   Procedure: FLEXIBLE SIGMOIDOSCOPY;  Surgeon: Manya Silvas, MD;  Location[facility] ENDOSCOPY;  Service: Endoscopy;  Laterality: N/A;  . HEMORROIDECTOMY      There were no vitals filed for this visit.  Subjective Assessment - 04/03/18 1312    Subjective  Pt received smiling and alert this date. Reports some trochanteric pain this date which is a new complaint, especially at night sleeping on her     Pertinent History  Patient has had parkinsons disease for the last 20 years. Her mobility has been getting worse after she had a left radius fx.  She was walking with RW in 10/18.  Patient has worseing loss of balance beginning 10/18.  She had HHPT 2 x week for 2 months beginining after hospitilization in Dec  2018.     Currently in Pain?  No/denies         TREATMENT THIS DATE:  -Single Leg LegPress Seathole 6; 3x10 bilat -Supine Cervical retraction 2x10x3secH -hooklying wandflexion 1x15, range limited to pain free Rt shoulder flexion (~135 degrees)  -MFR to Right deltoid for pain reduction and improved ROM(3 minutes)  -Right hip flexion stretch in supine x5 minutes (subjectivepsoas tightness) Right knee extension ROM deficits are limiting)  -Hooklying, hands on belly, folded handtowel under T4/5, VC for scapular retraction x 2 minutes *sore but tolerated well  -BLE glute set 10x3sec bilat heels on a pillow   PT Short Term Goals - 03/12/18 1527      PT SHORT TERM GOAL #1   Title  Patient will be independent in home exercise program to improve strength/mobility for better functional independence with ADLs.    Time  4    Period  Weeks    Status  Achieved      PT SHORT TERM GOAL #2   Title  Patient will perform sit to stand with correct hand placement and without support on the back of the chair with RW  and SBA.     Baseline  Patient needs min assist and needs min assist for correct hand placement. MinAx1 and minimum verbal cues for  handplacement with ea attempt.; 03/12/18: requires mod VCs for correct hand placement, and min A for safety    Time  4    Period  Weeks    Status  Not Met    Target Date  03/21/18      PT SHORT TERM GOAL #3   Title  Patient will stand for 1 minute with UE support to be able to perform safe transfers.     Baseline  Patient can stand for 10 seconds; Pt unable to stand without assistance, minAx1 for 30seconds; 03/12/18:     Time  8    Period  Weeks    Status  On-going    Target Date  04/18/18        PT Long Term Goals - 03/12/18 1527      PT LONG TERM GOAL #1   Title  Patient will be able to ambulate in parallel bars x 10 feet with min assist.    Baseline  ; Patient is ambulating wiht rollator 25 feet and min assist,     Time  12    Period  Weeks     Status  Achieved      PT LONG TERM GOAL #2   Title  Patient will ambulate short distances ,25 feet, with RW and mod assist.     Baseline  : 08/22/17 patient is ambulating 15 feet with RW:09/12/17 25 feet with min assist and rollator, 10/25/17, 12/03/17 patient ambulates 100 feet with min assist and rollator, 01/03/18= mod assist for 100 feet with rollator; 19/2020 min-mod assist for 48f    Time  8    Period  Weeks    Status  On-going    Target Date  04/18/18      PT LONG TERM GOAL #3   Title  Patient will be able to ascend 1 step with min assist with LARD.    Baseline  01/03/18=max assist with HHA; 02/20/18: not tested but estimate max A due to sit<>Stand ability and weakness;     Time  12    Period  Weeks    Status  On-going    Target Date  04/18/18      PT LONG TERM GOAL #4   Title  Patient (> 641years old) will complete five times sit to stand test in < 15 seconds indicating an increased LE strength and improved balance.    Baseline  01/03/18= 57.45 sec with mod assist ; 02/21/2018 40 secs with min-modA, posterior lean present with initial standing balance1/28/20: not assessed;     Time  8    Period  Weeks    Status  On-going    Target Date  04/18/18      PT LONG TERM GOAL #5   Title  Patient will be able to perform sit to stand transfer [facility]    Baseline  01/03/18 needs mod assist: 02/21/2018 needs min assist and verbal cues for anterior weight shift, scooting to edge of seat, LE positioning1/28/20: minA with mod VCs with rollator    Period  Weeks    Status  On-going    Target Date  04/18/18            Plan - 04/03/18 1317    Clinical Impression Statement  Ptable to complete entire session as planned without significant pain or fatigue limitations. Returned to single leg legpress for sprecific strengthening deep range hip extension to address range speciifc transfers difficulty and quads dominance  with STS tranfers. Performed supine stretching and postural  strengthening to assist with upright posture carryover to standing, pt reporting some subjectivetightness n Left hip anteiror the joint. Pt remains engaged adn motivated to perform her best in session.     Rehab Potential  Good    PT Frequency  2x / week    PT Duration  8 weeks    PT Treatment/Interventions  Therapeutic exercise;Therapeutic activities;Functional mobility training;Stair training;Balance training;Neuromuscular re-education;Patient/family education;Aquatic Therapy    PT Next Visit Plan  mobility training, strengthening, balanace training    PT Home Exercise Plan  HEP updated, administered (LAQ, seated marching, hip abduction with YTB, ankle/toe raises)    Consulted and Agree with Plan of Care  Patient       Patient will benefit from skilled therapeutic intervention in order to improve the following deficits and impairments:  Decreased balance, Decreased endurance, Decreased mobility, Decreased safety awareness, Decreased strength, Decreased activity tolerance  Visit Diagnosis: Muscle weakness (generalized)  Difficulty in walking, not elsewhere classified  Pain and swelling of right lower extremity  Parkinson's disease ([place])     Problem List Patient Active Problem List   Diagnosis Date Noted  . Pain and swelling of right lower extremity 01/02/2017  . Parkinson's disease ([place]) 01/02/2017    1:47 PM, 04/03/18 Etta Grandchild, PT, DPT Physical Therapist - [facility]  Outpatient Physical [place] [phone]     Etta Grandchild 04/03/2018, 1:39 PM  [place] MAIN [facility] SERVICES [address] [place], [place], [address] Phone: [phone]   Fax:  [phone]  Name: Kelsey Owens MRN: [id number] Date of Birth: 08/28/1940

## 2018-04-05 NOTE — Therapy (Signed)
[place] MAIN [facility] SERVICES [address] [place], [place], [address] Phone: [phone]   Fax:  [phone]  Occupational Therapy Treatment  Patient Details  Name: Kelsey Owens MRN: [id number] Date of Birth: 08/07/1940 No data recorded  Encounter Date: 04/03/2018  OT End of Session - 04/05/18 1325    Visit Number  34    Number of Visits  18    Date for OT Re-Evaluation  04/24/18    Authorization Time Period  Progress report period starting 03/14/2018    OT Start Time  1400    OT Stop Time  1446    OT Time Calculation (min)  46 min    Activity Tolerance  Patient tolerated treatment well    Behavior During Therapy  [facility] for tasks assessed/performed       Past Medical History:  Diagnosis Date  . Arthritis   . Bursitis   . CAD (coronary artery disease)   . GERD (gastroesophageal reflux disease)   . Hyperlipidemia   . Hypertension   . Hypophonia   . Intercostal pain   . Parkinson disease ([place])   . Paroxysmal supraventricular tachycardia [facility])     Past Surgical History:  Procedure Laterality Date  . ABDOMINAL HYSTERECTOMY    . CHOLECYSTECTOMY    . DEEP BRAIN STIMULATOR PLACEMENT    . FLEXIBLE SIGMOIDOSCOPY N/A 12/19/2017   Procedure: FLEXIBLE SIGMOIDOSCOPY;  Surgeon: Manya Silvas, MD;  Location: [facility] ENDOSCOPY;  Service: Endoscopy;  Laterality: N/A;  . HEMORROIDECTOMY      There were no vitals filed for this visit.  Subjective Assessment - 04/05/18 1324    Subjective   Patient reports her right arm hurts 6/10 and keeps her up at night.      Pertinent History  Pt. is a 78 y.o. female with a history of Parkinsons Disease. Pt. has a Deep Brain Stimulator, and has recently had it adjusted. Pt. has had multiple falls, and has a history of multiple wrist fractures.     Patient Stated Goals  To be as independent as possible.     Currently in Pain?  Yes    Pain Score  6     Pain Location  Shoulder    Pain Orientation  Right     Pain Descriptors / Indicators  Burning;Aching    Pain Onset  More than a month ago    Pain Frequency  Intermittent         Moist heat to right shoulder for 5 mins prior to therapy session to decrease pain and increased motion. (no charge) Patient complained of right shoulder pain.   Patient seen for right Shoulder ROM, active assistive with assist from therapist, guiding within range of motion limits and pain toleration.   Patient seen for coordination skills with manipulation of Ball pegs to pick up from container and place into grid, cues for prehension patterns on the right and patient is slow to complete task.  Patient performing Cutting with regular scissors with shapes and then with loop scissors, improved speed and performance with loop scissors. Verbal cues for hand placement on scissors.   Patient requires increased time to complete all tasks, slow at times with processing information and requests from therapist.  She was limited with right UE with participation in functional tasks secondary to increased pain in the right shoulder.  She responded well to moist heat prior to session followed by ROM exercises with assist from therapist. Continue to work towards  goals in POC to increase independence in necessary daily tasks.                     OT Education - 04/05/18 1324    Education Details  cutting with scissors    Person(s) Educated  Patient    Methods  Explanation;Demonstration    Comprehension  Verbalized understanding;Returned demonstration          OT Long Term Goals - 01/30/18 1425      OT LONG TERM GOAL #1   Title  Pt. will improve BUE strength by 2 muscle grades to improve ADL, and IADL functioning.    Baseline  01/30/2018: Pt. conitnues to present with weakness in BUEs    Time  12    Period  Weeks    Status  On-going    Target Date  04/24/18      OT LONG TERM GOAL #2   Title  Pt. will increase Left grip strength by 5# to be able to open jars,  and containers.    Baseline  01/30/2018: Pt. is now able toopen jars, and containers.    Time  12    Period  Weeks    Status  Partially Met    Target Date  04/24/18      OT LONG TERM GOAL #3   Title  Pt. will improve left hand [facility] skills  to be able to button clothing efficiently    Baseline  01/30/2018: Pt. has difficulty manipulating buttons efficiently.    Time  12    Period  Weeks    Status  On-going    Target Date  04/24/18      OT LONG TERM GOAL #4   Title  Pt. will independently write one sentence efficiently with 75% legibility.    Baseline  01/30/2018: Pt. conitnues to present with decreased writing legibility.    Time  12    Period  Weeks    Status  On-going    Target Date  04/24/18      OT LONG TERM GOAL #5   Title  Pt. independently demonstrate work simplification strategies, and compensatory startegies for safe IADL tasks/home management tasks.         OT LONG TERM GOAL #6   Title  Pt. will independently demonstrate work simplification strategies, and compensatory startegies within the kitchen from w/c height.    Baseline  Continue    Time  12    Period  Weeks    Status  On-going    Target Date  04/24/18      OT LONG TERM GOAL #7   Title  Pt. will be able to complete toilet hygiene care with supervision.    Baseline  Pt. is unable to perform the task.    Time  12    Period  Weeks    Status  On-going    Target Date  04/24/18            Plan - 04/05/18 1325    Clinical Impression Statement  Patient requires increased time to complete all tasks, slow at times with processing information and requests from therapist.  She was limited with right UE with participation in functional tasks secondary to increased pain in the right shoulder.  She responded well to moist heat prior to session followed by ROM exercises with assist from therapist. Continue to work towards goals in [place] to increase independence in necessary daily tasks.  Occupational Profile and  client history currently impacting functional performance  Pt. resides with her husband who is a Theme park manager. Pt. has grown children.    Occupational performance deficits (Please refer to evaluation for details):  ADL's;IADL's    Rehab Potential  Good    Current Impairments/barriers affecting progress:  Positive Barriers: age, motivation, family support. Negative Barriers: multiple comorbidities, history of multiple falls.    OT Frequency  2x / week    OT Duration  12 weeks    OT Treatment/Interventions  Self-care/ADL training;Neuromuscular education;Therapeutic exercise;DME and/or AE instruction;Visual/perceptual remediation/compensation;Patient/family education;Therapeutic activities    Consulted and Agree with Plan of Care  Patient       Patient will benefit from skilled therapeutic intervention in order to improve the following deficits and impairments:  Abnormal gait, Decreased cognition, Pain, Decreased strength, Impaired UE functional use, Decreased activity tolerance, Decreased balance  Visit Diagnosis: Muscle weakness (generalized)  Other lack of coordination  Parkinson's disease [facility])    Problem List Patient Active Problem List   Diagnosis Date Noted  . Pain and swelling of right lower extremity 01/02/2017  . Parkinson's disease ([place]) 01/02/2017   Daril Warga T Tomasita Morrow, OTR/L, CLT  Aeron Lheureux 04/05/2018, 1:27 PM  [place] MAIN [facility] SERVICES [address] [place], [place], [address] Phone: [phone]   Fax:  [phone]  Name: Kelsey Owens MRN: [id number] Date of Birth: [birth year]

## 2018-04-09 ENCOUNTER — Ambulatory Visit: Payer: Medicare Other | Admitting: Occupational Therapy

## 2018-04-09 ENCOUNTER — Encounter: Payer: Self-pay | Admitting: Occupational Therapy

## 2018-04-09 ENCOUNTER — Ambulatory Visit: Payer: Medicare Other

## 2018-04-09 DIAGNOSIS — M79604 Pain in right leg: Secondary | ICD-10-CM

## 2018-04-09 DIAGNOSIS — G20 Parkinson's disease: Secondary | ICD-10-CM

## 2018-04-09 DIAGNOSIS — M6281 Muscle weakness (generalized): Secondary | ICD-10-CM

## 2018-04-09 DIAGNOSIS — M7989 Other specified soft tissue disorders: Secondary | ICD-10-CM

## 2018-04-09 DIAGNOSIS — R278 Other lack of coordination: Secondary | ICD-10-CM

## 2018-04-09 DIAGNOSIS — G20A1 Parkinson's disease without dyskinesia, without mention of fluctuations: Secondary | ICD-10-CM

## 2018-04-09 DIAGNOSIS — R262 Difficulty in walking, not elsewhere classified: Secondary | ICD-10-CM

## 2018-04-09 NOTE — Therapy (Signed)
[place] MAIN [facility] SERVICES [address] [place], [place], [address] Phone: [phone]   Fax:  [phone]  Physical Therapy Treatment  Patient Details  Name: Kelsey Owens MRN: [id number] Date of Birth: 1940/08/26 Referring Provider (PT): Jennelle Human   Encounter Date: 04/09/2018  PT End of Session - 04/09/18 1407    Visit Number  45    Number of Visits  50    Date for PT Re-Evaluation  04/18/18    Authorization Type  goals last assessed 03/12/18    PT Start Time  1357   pt arrived late   PT Stop Time  1430    PT Time Calculation (min)  33 min    Activity Tolerance  Patient tolerated treatment well;Patient limited by fatigue    Behavior During Therapy  [facility] for tasks assessed/performed       Past Medical History:  Diagnosis Date  . Arthritis   . Bursitis   . CAD (coronary artery disease)   . GERD (gastroesophageal reflux disease)   . Hyperlipidemia   . Hypertension   . Hypophonia   . Intercostal pain   . Parkinson disease ([place])   . Paroxysmal supraventricular tachycardia [facility])     Past Surgical History:  Procedure Laterality Date  . ABDOMINAL HYSTERECTOMY    . CHOLECYSTECTOMY    . DEEP BRAIN STIMULATOR PLACEMENT    . FLEXIBLE SIGMOIDOSCOPY N/A 12/19/2017   Procedure: FLEXIBLE SIGMOIDOSCOPY;  Surgeon: Manya Silvas, MD;  Location: [facility] ENDOSCOPY;  Service: Endoscopy;  Laterality: N/A;  . HEMORROIDECTOMY      There were no vitals filed for this visit.  Subjective Assessment - 04/09/18 1405    Subjective  Pt doing well this date. Report she had a good weeked. She says she felt some sorenes in her hips after last session, otherwise no pain.     Pertinent History  Patient has had parkinsons disease for the last 20 years. Her mobility has been getting worse after she had a left radius fx.  She was walking with RW in 10/18.  Patient has worseing loss of balance beginning 10/18.  She had HHPT 2 x week for 2 months  beginining after hospitilization in Dec 2018.     Currently in Pain?  No/denies       TREATMENT THIS DATE:   Therapeutic Exercise:  -Single Leg LegPress Seathole 6; 2x10 bilat, 35lbs  -Supine Cervical retraction 1x10x3secH -Gentle Right adductor longus/pectineus release x3 minutes to address painful spasm *improved horizontal hip abdct thereafter, and less pain in Right groin when lying flat supine -hooklying wandflexion 1x15, range limited to pain free Rt shoulder flexion (~135 degrees) -Right hip flexion stretch in supine x5 minutes (subjectivepsoas tightness) Right knee extension ROM deficits are limiting)  -BLE glute set 10x3sec bilat heels on a pillow *education on postural breathing in supine with belly expansion for diaphragmatic strengthening. 5x supine    PT Short Term Goals - 03/12/18 1527      PT SHORT TERM GOAL #1   Title  Patient will be independent in home exercise program to improve strength/mobility for better functional independence with ADLs.    Time  4    Period  Weeks    Status  Achieved      PT SHORT TERM GOAL #2   Title  Patient will perform sit to stand with correct hand placement and without support on the back of the chair with RW  and SBA.  Baseline  Patient needs min assist and needs min assist for correct hand placement. MinAx1 and minimum verbal cues for handplacement with ea attempt.; 03/12/18: requires mod VCs for correct hand placement, and min A for safety    Time  4    Period  Weeks    Status  Not Met    Target Date  03/21/18      PT SHORT TERM GOAL #3   Title  Patient will stand for 1 minute with UE support to be able to perform safe transfers.     Baseline  Patient can stand for 10 seconds; Pt unable to stand without assistance, minAx1 for 30seconds; 03/12/18:     Time  8    Period  Weeks    Status  On-going    Target Date  04/18/18        PT Long Term Goals - 03/12/18 1527      PT LONG TERM GOAL #1   Title  Patient will be able to  ambulate in parallel bars x 10 feet with min assist.    Baseline  ; Patient is ambulating wiht rollator 25 feet and min assist,     Time  12    Period  Weeks    Status  Achieved      PT LONG TERM GOAL #2   Title  Patient will ambulate short distances ,25 feet, with RW and mod assist.     Baseline  : 08/22/17 patient is ambulating 15 feet with RW:09/12/17 25 feet with min assist and rollator, 10/25/17, 12/03/17 patient ambulates 100 feet with min assist and rollator, 01/03/18= mod assist for 100 feet with rollator; 19/2020 min-mod assist for 41f    Time  8    Period  Weeks    Status  On-going    Target Date  04/18/18      PT LONG TERM GOAL #3   Title  Patient will be able to ascend 1 step with min assist with LARD.    Baseline  01/03/18=max assist with HHA; 02/20/18: not tested but estimate max A due to sit<>Stand ability and weakness;     Time  12    Period  Weeks    Status  On-going    Target Date  04/18/18      PT LONG TERM GOAL #4   Title  Patient (> 660years old) will complete five times sit to stand test in < 15 seconds indicating an increased LE strength and improved balance.    Baseline  01/03/18= 57.45 sec with mod assist ; 02/21/2018 40 secs with min-modA, posterior lean present with initial standing balance1/28/20: not assessed;     Time  8    Period  Weeks    Status  On-going    Target Date  04/18/18      PT LONG TERM GOAL #5   Title  Patient will be able to perform [facility] stand [facility]    Baseline  01/03/18 needs mod assist: 02/21/2018 needs min assist and verbal cues for anterior weight shift, scooting to edge of seat, LE positioning1/28/20: minA with mod VCs with rollator    Period  Weeks    Status  On-going    Target Date  04/18/18            Plan - 04/09/18 1408    Clinical Impression Statement  Pt able to complete short session with rest breaks provided as needed. Verbal cues  for full ROM on leg press to target deep range hip extension.  Continued with progressive time in supine for postural strengthening and postural mobility. Pt requires maxA for pivot transfers to/from [facility]. Pt appears to demonstrate improved control on leg press this date. Less time requires to obtain full supine.     Rehab Potential  Good    PT Frequency  2x / week    PT Duration  8 weeks    PT Treatment/Interventions  Therapeutic exercise;Therapeutic activities;Functional mobility training;Stair training;Balance training;Neuromuscular re-education;Patient/family education;Aquatic Therapy    PT Next Visit Plan  mobility training, strengthening, balanace training    PT Home Exercise Plan  HEP updated, administered (LAQ, seated marching, hip abduction with YTB, ankle/toe raises)    Consulted and Agree with Plan of Care  Patient       Patient will benefit from skilled therapeutic intervention in order to improve the following deficits and impairments:  Decreased balance, Decreased endurance, Decreased mobility, Decreased safety awareness, Decreased strength, Decreased activity tolerance  Visit Diagnosis: Muscle weakness (generalized)  Difficulty in walking, not elsewhere classified  Parkinson's disease ([place])  Other lack of coordination  Pain and swelling of right lower extremity     Problem List Patient Active Problem List   Diagnosis Date Noted  . Pain and swelling of right lower extremity 01/02/2017  . Parkinson's disease ([place]) 01/02/2017   2:32 PM, 04/09/18 Etta Grandchild, PT, DPT Physical Therapist - [facility]  Outpatient Physical Therapy- [place] [phone]     Etta Grandchild 04/09/2018, 2:31 PM  [place] MAIN [facility] SERVICES [address] [place], [place], [address] Phone: [phone]   Fax:  [phone]  Name: Kelsey Owens MRN: [id number] Date of Birth: 05-Dec-1940

## 2018-04-09 NOTE — Therapy (Signed)
[place] MAIN [facility] SERVICES [address] [place], [place], [address] Phone: [phone]   Fax:  [phone]  Occupational Therapy Treatment  Patient Details  Name: Kelsey Owens MRN: [id number] Date of Birth: 08-23-1940 No data recorded  Encounter Date: 04/09/2018  OT End of Session - 04/09/18 1612    Visit Number  35    Number of Visits  17    Date for OT Re-Evaluation  04/24/18    Authorization Time Period  Progress report period starting 03/14/2018    OT Start Time  1435    OT Stop Time  1515    OT Time Calculation (min)  40 min    Activity Tolerance  Patient tolerated treatment well    Behavior During Therapy  [facility] for tasks assessed/performed       Past Medical History:  Diagnosis Date  . Arthritis   . Bursitis   . CAD (coronary artery disease)   . GERD (gastroesophageal reflux disease)   . Hyperlipidemia   . Hypertension   . Hypophonia   . Intercostal pain   . Parkinson disease ([place])   . Paroxysmal supraventricular tachycardia [facility])     Past Surgical History:  Procedure Laterality Date  . ABDOMINAL HYSTERECTOMY    . CHOLECYSTECTOMY    . DEEP BRAIN STIMULATOR PLACEMENT    . FLEXIBLE SIGMOIDOSCOPY N/A 12/19/2017   Procedure: FLEXIBLE SIGMOIDOSCOPY;  Surgeon: Manya Silvas, MD;  [facility] ENDOSCOPY;  Service: Endoscopy;  Laterality: N/A;  . HEMORROIDECTOMY      There were no vitals filed for this visit.  Subjective Assessment - 04/09/18 1611    Subjective   Pt. reports doing well today.    Patient is accompained by:  Family member    Pertinent History  Pt. is a 78 y.o. female with a history of Parkinsons Disease. Pt. has a Deep Brain Stimulator, and has recently had it adjusted. Pt. has had multiple falls, and has a history of multiple wrist fractures.     Patient Stated Goals  To be as independent as possible.     Currently in Pain?  Yes    Pain Score  --   Not rated   Pain Location  Shoulder    Pain  Orientation  Right       OT TREATMENT    Neuro muscular re-education:  Pt. worked on using her right hand for grasping, flipping 2" large pegs on the Instructo board placed at a tabletop surface, working from the left to the right. Pt. required cues to identify the correct pegs to flip, and the correct holes to place the pegs in.  Pt. required frequent cues to continue following the correct directions. Pt. Required the pegboard to be repositioned secondary to right shoulder discomfort with reaching.   Selfcare:  Pt. worked on Estate agent tasks, and writing legibility pt. was able to print word lists with 75% accuracy with no deviation from the line. Pt. Was able to write in cursive form with 50% legibility with frequent deviation above the line.                          OT Education - 04/09/18 1612    Education Details  [facility] skills    Person(s) Educated  Patient    Methods  Explanation;Demonstration    Comprehension  Verbalized understanding;Returned demonstration          OT Long Term Goals - 01/30/18  [place] #1   Title  Pt. will improve BUE strength by 2 muscle grades to improve ADL, and IADL functioning.    Baseline  01/30/2018: Pt. conitnues to present with weakness in BUEs    Time  12    Period  Weeks    Status  On-going    Target Date  04/24/18      OT LONG TERM GOAL #2   Title  Pt. will increase Left grip strength by 5# to be able to open jars, and containers.    Baseline  01/30/2018: Pt. is now able toopen jars, and containers.    Time  12    Period  Weeks    Status  Partially Met    Target Date  04/24/18      OT LONG TERM GOAL #3   Title  Pt. will improve left hand [facility] skills  to be able to button clothing efficiently    Baseline  01/30/2018: Pt. has difficulty manipulating buttons efficiently.    Time  12    Period  Weeks    Status  On-going    Target Date  04/24/18      OT LONG TERM GOAL #4   Title  Pt. will  independently write one sentence efficiently with 75% legibility.    Baseline  01/30/2018: Pt. conitnues to present with decreased writing legibility.    Time  12    Period  Weeks    Status  On-going    Target Date  04/24/18      OT LONG TERM GOAL #5   Title  Pt. independently demonstrate work simplification strategies, and compensatory startegies for safe IADL tasks/home management tasks.         OT LONG TERM GOAL #6   Title  Pt. will independently demonstrate work simplification strategies, and compensatory startegies within the kitchen from w/c height.    Baseline  Continue    Time  12    Period  Weeks    Status  On-going    Target Date  04/24/18      OT LONG TERM GOAL #7   Title  Pt. will be able to complete toilet hygiene care with supervision.    Baseline  Pt. is unable to perform the task.    Time  12    Period  Weeks    Status  On-going    Target Date  04/24/18            Plan - 04/09/18 1613    Clinical Impression Statement  Pt. continues to require increased time, and requires increased cuing to complete tasks. Pt. continues to work on improving UE functioning, hand function, and [facility] skills in order to improve ADL, and IADL functioning.    Occupational Profile and client history currently impacting functional performance  Pt. resides with her husband who is a Theme park manager. Pt. has grown children.    Occupational performance deficits (Please refer to evaluation for details):  ADL's;IADL's    Rehab Potential  Good    Current Impairments/barriers affecting progress:  Positive Barriers: age, motivation, family support. Negative Barriers: multiple comorbidities, history of multiple falls.    OT Frequency  2x / week    OT Duration  12 weeks    OT Treatment/Interventions  Self-care/ADL training;Neuromuscular education;Therapeutic exercise;DME and/or AE instruction;Visual/perceptual remediation/compensation;Patient/family education;Therapeutic activities    Clinical Decision  Making  Several treatment options, min-mod task modification necessary  Consulted and Agree with Plan of Care  Patient       Patient will benefit from skilled therapeutic intervention in order to improve the following deficits and impairments:  Abnormal gait, Decreased cognition, Pain, Decreased strength, Impaired UE functional use, Decreased activity tolerance, Decreased balance  Visit Diagnosis: Muscle weakness (generalized)  Other lack of coordination    Problem List Patient Active Problem List   Diagnosis Date Noted  . Pain and swelling of right lower extremity 01/02/2017  . Parkinson's disease ([place]) 01/02/2017    Harrel Carina, MS, OTR/L 04/09/2018, 4:32 PM  [place] MAIN [facility] SERVICES [address] [place], [place], [address] Phone: [phone]   Fax:  [phone]  Name: Kelsey Owens MRN: [id number] Date of Birth: 09-Mar-1940

## 2018-04-11 ENCOUNTER — Encounter: Payer: Self-pay | Admitting: Physical Therapy

## 2018-04-11 ENCOUNTER — Ambulatory Visit: Payer: Medicare Other | Admitting: Occupational Therapy

## 2018-04-11 ENCOUNTER — Ambulatory Visit: Payer: Medicare Other

## 2018-04-11 ENCOUNTER — Encounter: Payer: Self-pay | Admitting: Occupational Therapy

## 2018-04-11 DIAGNOSIS — M6281 Muscle weakness (generalized): Secondary | ICD-10-CM

## 2018-04-11 DIAGNOSIS — R278 Other lack of coordination: Secondary | ICD-10-CM

## 2018-04-11 DIAGNOSIS — M79604 Pain in right leg: Secondary | ICD-10-CM

## 2018-04-11 DIAGNOSIS — G20 Parkinson's disease: Secondary | ICD-10-CM

## 2018-04-11 DIAGNOSIS — R262 Difficulty in walking, not elsewhere classified: Secondary | ICD-10-CM

## 2018-04-11 DIAGNOSIS — M7989 Other specified soft tissue disorders: Secondary | ICD-10-CM

## 2018-04-11 NOTE — Therapy (Signed)
[place] MAIN [facility] SERVICES [address] [place], [place], [address] Phone: [phone]   Fax:  [phone]  Physical Therapy Treatment  Patient Details  Name: Kelsey Owens MRN: [id number] Date of Birth: 05/06/1940 Referring Provider (PT): Jennelle Human   Encounter Date: 04/11/2018  PT End of Session - 04/11/18 1428    Visit Number  46    Number of Visits  50    Date for PT Re-Evaluation  04/18/18    Authorization Type  goals last assessed 03/12/18    PT Start Time  1346    PT Stop Time  1425    PT Time Calculation (min)  39 min    Equipment Utilized During Treatment  Gait belt       Past Medical History:  Diagnosis Date  . Arthritis   . Bursitis   . CAD (coronary artery disease)   . GERD (gastroesophageal reflux disease)   . Hyperlipidemia   . Hypertension   . Hypophonia   . Intercostal pain   . Parkinson disease ([place])   . Paroxysmal supraventricular tachycardia [facility])     Past Surgical History:  Procedure Laterality Date  . ABDOMINAL HYSTERECTOMY    . CHOLECYSTECTOMY    . DEEP BRAIN STIMULATOR PLACEMENT    . FLEXIBLE SIGMOIDOSCOPY N/A 12/19/2017   Procedure: FLEXIBLE SIGMOIDOSCOPY;  Surgeon: Manya Silvas, MD;  Location: [facility] ENDOSCOPY;  Service: Endoscopy;  Laterality: N/A;  . HEMORROIDECTOMY      There were no vitals filed for this visit.      Subjective Assessment - 04/11/18 1435    Subjective  Patient reported that she has R shoulder pain, that she has had it for about 5 weeks since an injection. Does endorse TTP and some clicking/popping of R shoulder. Pt encouraged to contact PCP if pain continues to update physician.     Patient is accompained by:  Family member    Pertinent History  Patient has had parkinsons disease for the last 20 years. Her mobility has been getting worse after she had a left radius fx.  She was walking with RW in 10/18.  Patient has worseing loss of balance beginning 10/18.  She  had HHPT 2 x week for 2 months beginining after hospitilization in Dec 2018.     Patient Stated Goals  Patient wants to be able to walk better and not fall as often    Currently in Pain?  Yes    Pain Score  8     Pain Location  Shoulder    Pain Orientation  Right    Pain Descriptors / Indicators  Sore;Tender    Pain Type  Chronic pain    Pain Onset  More than a month ago       TREATMENT: Functional activities: Ambulated ~5 meters x2 with RW and close chair follow, minAx1 for balance, and RW management sit to stands x7 with minAx1 and close chair guard, pt very fatigued after Standing weight shifts FA and foot one half step forward x10 ea weight shift   Manual therapy:  STM to R deltoid for pain x2mnutes, AAROM x10 for ea direction of R shoulder to promote motion/pain relief     PT Education - 04/11/18 1436    Education Details  transfers, gait training    Person(s) Educated  Patient    Methods  Verbal cues;Tactile cues;Explanation    Comprehension  Verbalized understanding;Returned demonstration;Need further instruction       PT  Short Term Goals - 03/12/18 1527      PT SHORT TERM GOAL #1   Title  Patient will be independent in home exercise program to improve strength/mobility for better functional independence with ADLs.    Time  4    Period  Weeks    Status  Achieved      PT SHORT TERM GOAL #2   Title  Patient will perform sit to stand with correct hand placement and without support on the back of the chair with RW  and SBA.     Baseline  Patient needs min assist and needs min assist for correct hand placement. MinAx1 and minimum verbal cues for handplacement with ea attempt.; 03/12/18: requires mod VCs for correct hand placement, and min A for safety    Time  4    Period  Weeks    Status  Not Met    Target Date  03/21/18      PT SHORT TERM GOAL #3   Title  Patient will stand for 1 minute with UE support to be able to perform safe transfers.     Baseline  Patient  can stand for 10 seconds; Pt unable to stand without assistance, minAx1 for 30seconds; 03/12/18:     Time  8    Period  Weeks    Status  On-going    Target Date  04/18/18        PT Long Term Goals - 03/12/18 1527      PT LONG TERM GOAL #1   Title  Patient will be able to ambulate in parallel bars x 10 feet with min assist.    Baseline  ; Patient is ambulating wiht rollator 25 feet and min assist,     Time  12    Period  Weeks    Status  Achieved      PT LONG TERM GOAL #2   Title  Patient will ambulate short distances ,25 feet, with RW and mod assist.     Baseline  : 08/22/17 patient is ambulating 15 feet with RW:09/12/17 25 feet with min assist and rollator, 10/25/17, 12/03/17 patient ambulates 100 feet with min assist and rollator, 01/03/18= mod assist for 100 feet with rollator; 19/2020 min-mod assist for 6f    Time  8    Period  Weeks    Status  On-going    Target Date  04/18/18      PT LONG TERM GOAL #3   Title  Patient will be able to ascend 1 step with min assist with LARD.    Baseline  01/03/18=max assist with HHA; 02/20/18: not tested but estimate max A due to sit<>Stand ability and weakness;     Time  12    Period  Weeks    Status  On-going    Target Date  04/18/18      PT LONG TERM GOAL #4   Title  Patient (> 612years old) will complete five times sit to stand test in < 15 seconds indicating an increased LE strength and improved balance.    Baseline  01/03/18= 57.45 sec with mod assist ; 02/21/2018 40 secs with min-modA, posterior lean present with initial standing balance1/28/20: not assessed;     Time  8    Period  Weeks    Status  On-going    Target Date  04/18/18      PT LONG TERM GOAL #5   Title  Patient will be able to perform  sit to stand transfer with sBA from [facility]     Baseline  01/03/18 needs mod assist: 02/21/2018 needs min assist and verbal cues for anterior weight shift, scooting to edge of seat, LE positioning1/28/20: minA with mod VCs with rollator    Period   Weeks    Status  On-going    Target Date  04/18/18            Plan - 04/11/18 1603    Clinical Impression Statement  Patient with improved sequencing of movements, today, able to follow one step commands well. Pt with improved ambulation this session as well, increased distance and step length with verbal cues, tends to ambulate with narrow base of support.  Patient with CGA-minAx1 throughout all mobility this session. The patient would benefit from further skilled PT to progress towards goals.     Rehab Potential  Good    PT Frequency  2x / week    PT Duration  8 weeks    PT Treatment/Interventions  Therapeutic exercise;Therapeutic activities;Functional mobility training;Stair training;Balance training;Neuromuscular re-education;Patient/family education;Aquatic Therapy    PT Next Visit Plan  mobility training, strengthening, balanace training    PT Home Exercise Plan  HEP updated, administered (LAQ, seated marching, hip abduction with YTB, ankle/toe raises)    Consulted and Agree with Plan of Care  Patient       Patient will benefit from skilled therapeutic intervention in order to improve the following deficits and impairments:  Decreased balance, Decreased endurance, Decreased mobility, Decreased safety awareness, Decreased strength, Decreased activity tolerance  Visit Diagnosis: Muscle weakness (generalized)  Pain and swelling of right lower extremity  Other lack of coordination  Parkinson's disease ([place])  Difficulty in walking, not elsewhere classified     Problem List Patient Active Problem List   Diagnosis Date Noted  . Pain and swelling of right lower extremity 01/02/2017  . Parkinson's disease ([place]) 01/02/2017   Lieutenant Diego PT, DPT 4:13 PM,04/11/18 [place] MAIN [facility] SERVICES [address] [place], [place], [address] Phone: [phone]   Fax:  [phone]  Name: Kelsey Owens MRN:  [id number] Date of Birth: 07-01-1940

## 2018-04-11 NOTE — Therapy (Signed)
[place] MAIN [facility] SERVICES [address] [place], [place], [address] Phone: [phone]   Fax:  [phone]  Occupational Therapy Treatment  Patient Details  Name: Kelsey Owens MRN: [id number] Date of Birth: September 17, 1940 No data recorded  Encounter Date: 04/11/2018  OT End of Session - 04/11/18 1430    Visit Number  36    Number of Visits  60    Date for OT Re-Evaluation  04/24/18    Authorization Time Period  Progress report period starting 03/14/2018    OT Start Time  1430    OT Stop Time  1515    OT Time Calculation (min)  45 min    Activity Tolerance  Patient tolerated treatment well    Behavior During Therapy  [facility] for tasks assessed/performed       Past Medical History:  Diagnosis Date  . Arthritis   . Bursitis   . CAD (coronary artery disease)   . GERD (gastroesophageal reflux disease)   . Hyperlipidemia   . Hypertension   . Hypophonia   . Intercostal pain   . Parkinson disease ([place])   . Paroxysmal supraventricular tachycardia [facility])     Past Surgical History:  Procedure Laterality Date  . ABDOMINAL HYSTERECTOMY    . CHOLECYSTECTOMY    . DEEP BRAIN STIMULATOR PLACEMENT    . FLEXIBLE SIGMOIDOSCOPY N/A 12/19/2017   Procedure: FLEXIBLE SIGMOIDOSCOPY;  Surgeon: Manya Silvas, MD;  Location: [facility] ENDOSCOPY;  Service: Endoscopy;  Laterality: N/A;  . HEMORROIDECTOMY      There were no vitals filed for this visit.  Subjective Assessment - 04/11/18 1428    Subjective   Pt. reports that her right shoulder hurts her when she writes for a a prolonged period of time at home. Pt. reports her back bothers her at times.    Patient is accompained by:  Family member    Pertinent History  Pt. is a 78 y.o. female with a history of Parkinsons Disease. Pt. has a Deep Brain Stimulator, and has recently had it adjusted. Pt. has had multiple falls, and has a history of multiple wrist fractures.     Patient Stated Goals  To be as  independent as possible.     Currently in Pain?  Yes    Pain Score  5     Pain Location  Shoulder    Pain Orientation  Right    Pain Descriptors / Indicators  Aching    Multiple Pain Sites  Yes    Pain Score  3    Pain Location  Back       OT TREATMENT    Neuro muscular re-education:  Pt. worked on left hand [facility] skills grasping small earring backings of varying sizes and placing them on an elevated surface at the tabletop. Pt. required cues to sort them back into the correct container. Pt. requires continuous verbal cues for directions. Pt. worked on using her right hand to grasp 1/4" pegs, and place them onto a small pegboard using 2pt. and 3pt. Pt. required less cues for this task. Pt. worked on using rebound scissors to perform controlled cutting following a design pattern.                         OT Education - 04/11/18 1430    Education Details  [facility] skills    Person(s) Educated  Patient    Methods  Explanation;Demonstration    Comprehension  Verbalized understanding;Returned demonstration          OT Long Term Goals - 01/30/18 1425      OT LONG TERM GOAL #1   Title  Pt. will improve BUE strength by 2 muscle grades to improve ADL, and IADL functioning.    Baseline  01/30/2018: Pt. conitnues to present with weakness in BUEs    Time  12    Period  Weeks    Status  On-going    Target Date  04/24/18      OT LONG TERM GOAL #2   Title  Pt. will increase Left grip strength by 5# to be able to open jars, and containers.    Baseline  01/30/2018: Pt. is now able toopen jars, and containers.    Time  12    Period  Weeks    Status  Partially Met    Target Date  04/24/18      OT LONG TERM GOAL #3   Title  Pt. will improve left hand [facility] skills  to be able to button clothing efficiently    Baseline  01/30/2018: Pt. has difficulty manipulating buttons efficiently.    Time  12    Period  Weeks    Status  On-going    Target Date  04/24/18      OT LONG  TERM GOAL #4   Title  Pt. will independently write one sentence efficiently with 75% legibility.    Baseline  01/30/2018: Pt. conitnues to present with decreased writing legibility.    Time  12    Period  Weeks    Status  On-going    Target Date  04/24/18      OT LONG TERM GOAL #5   Title  Pt. independently demonstrate work simplification strategies, and compensatory startegies for safe IADL tasks/home management tasks.         OT LONG TERM GOAL #6   Title  Pt. will independently demonstrate work simplification strategies, and compensatory startegies within the kitchen from w/c height.    Baseline  Continue    Time  12    Period  Weeks    Status  On-going    Target Date  04/24/18      OT LONG TERM GOAL #7   Title  Pt. will be able to complete toilet hygiene care with supervision.    Baseline  Pt. is unable to perform the task.    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 Pt. continues to work on improving [facility] skills with her right hand. Pt. continues to require skilled OT serivces to work on manipulating objects in order to be able to manipulate buttons, open containers, use scissors, and write legibly.     Occupational Profile and client history currently impacting functional performance  Pt. resides with her husband who is a Theme park manager. Pt. has grown children.     Occupational performance deficits (Please refer to evaluation for details):  ADL's;IADL's    Rehab Potential  Good    Current Impairments/barriers affecting progress:  Positive Barriers: age, motivation, family support. Negative Barriers: multiple comorbidities, history of multiple falls.    OT Frequency  2x / week    OT Duration  12 weeks  OT Treatment/Interventions  Self-care/ADL training;Neuromuscular education;Therapeutic exercise;DME and/or AE  instruction;Visual/perceptual remediation/compensation;Patient/family education;Therapeutic activities    Clinical Decision Making  Several treatment options, min-mod task modification necessary    Consulted and Agree with Plan of Care  Patient       Patient will benefit from skilled therapeutic intervention in order to improve the following deficits and impairments:  Abnormal gait, Decreased cognition, Pain, Decreased strength, Impaired UE functional use, Decreased activity tolerance, Decreased balance  Visit Diagnosis: Muscle weakness (generalized)  Other lack of coordination    Problem List Patient Active Problem List   Diagnosis Date Noted  . Pain and swelling of right lower extremity 01/02/2017  . Parkinson's disease ([place]) 01/02/2017    Harrel Carina, MS, OTR/L 04/11/2018, 2:54 PM  [place] MAIN [facility] SERVICES [address] [place], [place], [address] Phone: [phone]   Fax:  [phone]  Name: Shiloh Southern MRN: [id number] Date of Birth: August 20, 1940

## 2018-04-16 ENCOUNTER — Ambulatory Visit: Payer: Medicare Other | Admitting: Physical Therapy

## 2018-04-16 ENCOUNTER — Encounter: Payer: Self-pay | Admitting: Occupational Therapy

## 2018-04-16 ENCOUNTER — Encounter: Payer: Self-pay | Admitting: Physical Therapy

## 2018-04-16 ENCOUNTER — Ambulatory Visit: Payer: Medicare Other | Attending: Family Medicine | Admitting: Occupational Therapy

## 2018-04-16 DIAGNOSIS — G20 Parkinson's disease: Secondary | ICD-10-CM | POA: Insufficient documentation

## 2018-04-16 DIAGNOSIS — G20A1 Parkinson's disease without dyskinesia, without mention of fluctuations: Secondary | ICD-10-CM

## 2018-04-16 DIAGNOSIS — M7989 Other specified soft tissue disorders: Secondary | ICD-10-CM

## 2018-04-16 DIAGNOSIS — R262 Difficulty in walking, not elsewhere classified: Secondary | ICD-10-CM

## 2018-04-16 DIAGNOSIS — M79604 Pain in right leg: Secondary | ICD-10-CM | POA: Insufficient documentation

## 2018-04-16 DIAGNOSIS — M6281 Muscle weakness (generalized): Secondary | ICD-10-CM | POA: Insufficient documentation

## 2018-04-16 DIAGNOSIS — R278 Other lack of coordination: Secondary | ICD-10-CM

## 2018-04-16 NOTE — Therapy (Signed)
[place] MAIN [facility] SERVICES [address] [place], [place], [address] Phone: [phone]   Fax:  [phone]  Occupational Therapy Treatment  Patient Details  Name: Kelsey Owens MRN: [id number] Date of Birth: [birth year] No data recorded  Encounter Date: 04/16/2018  OT End of Session - 04/16/18 1458    Visit Number  37    Number of Visits  44    Date for OT Re-Evaluation  04/24/18    Authorization Time Period  Progress report period starting 03/14/2018    OT Start Time  1430    OT Stop Time  1515    OT Time Calculation (min)  45 min    Activity Tolerance  Patient tolerated treatment well    Behavior During Therapy  [facility] for tasks assessed/performed       Past Medical History:  Diagnosis Date  . Arthritis   . Bursitis   . CAD (coronary artery disease)   . GERD (gastroesophageal reflux disease)   . Hyperlipidemia   . Hypertension   . Hypophonia   . Intercostal pain   . Parkinson disease ([place])   . Paroxysmal supraventricular tachycardia [facility])     Past Surgical History:  Procedure Laterality Date  . ABDOMINAL HYSTERECTOMY    . CHOLECYSTECTOMY    . DEEP BRAIN STIMULATOR PLACEMENT    . FLEXIBLE SIGMOIDOSCOPY N/A 12/19/2017   Procedure: FLEXIBLE SIGMOIDOSCOPY;  Surgeon: Manya Silvas, MD;  Location: [facility] ENDOSCOPY;  Service: Endoscopy;  Laterality: N/A;  . HEMORROIDECTOMY      There were no vitals filed for this visit.  Subjective Assessment - 04/16/18 1454    Subjective   Pt. reports that she is doing well.    Patient is accompanied by:  Family member    Pertinent History  Pt. is a 78 y.o. female with a history of Parkinsons Disease. Pt. has a Deep Brain Stimulator, and has recently had it adjusted. Pt. has had multiple falls, and has a history of multiple wrist fractures.     Patient Stated Goals  To be as independent as possible.     Currently in Pain?  Yes    Pain Score  6     Pain Location  Shoulder    Pain  Orientation  Right    Pain Descriptors / Indicators  Sore;Tender    Pain Type  Chronic pain      OT TREATMENT    Neuro muscular re-education:  Pt. worked on grasping and manipulating 1/2" washers from a magnetic dish. Pt. worked on using 2pt. Grasp to place the washers on a horizontal dowel. Pt. worked on [employer] the washers.   Therapeutic Exercise:  Pt. performed BUE gross gripping with grip strengthener. Pt. worked on sustaining grip while grasping pegs and reaching at various heights. The gripper was placed in the 2nd resistive slot with the white resistive spring. Pt. worked on pinch strengthening in the left hand for lateral, and 3pt. pinch using yellow, red, and green resistive clips. Pt. worked on placing the clips at various vertical and horizontal angles. Tactile and verbal cues were required for hand position, and motor planning.                         OT Education - 04/16/18 1458    Education Details  [facility] skills    Person(s) Educated  Patient    Methods  Explanation;Demonstration    Comprehension  Verbalized understanding;Returned demonstration  OT Long Term Goals - 01/30/18 1425      OT LONG TERM GOAL #1   Title  Pt. will improve BUE strength by 2 muscle grades to improve ADL, and IADL functioning.    Baseline  01/30/2018: Pt. conitnues to present with weakness in BUEs    Time  12    Period  Weeks    Status  On-going    Target Date  04/24/18      OT LONG TERM GOAL #2   Title  Pt. will increase Left grip strength by 5# to be able to open jars, and containers.    Baseline  01/30/2018: Pt. is now able toopen jars, and containers.    Time  12    Period  Weeks    Status  Partially Met    Target Date  04/24/18      OT LONG TERM GOAL #3   Title  Pt. will improve left hand [facility] skills  to be able to button clothing efficiently    Baseline  01/30/2018: Pt. has difficulty manipulating buttons efficiently.    Time  12    Period  Weeks     Status  On-going    Target Date  04/24/18      OT LONG TERM GOAL #4   Title  Pt. will independently write one sentence efficiently with 75% legibility.    Baseline  01/30/2018: Pt. conitnues to present with decreased writing legibility.    Time  12    Period  Weeks    Status  On-going    Target Date  04/24/18      OT LONG TERM GOAL #5   Title  Pt. independently demonstrate work simplification strategies, and compensatory startegies for safe IADL tasks/home management tasks.         OT LONG TERM GOAL #6   Title  Pt. will independently demonstrate work simplification strategies, and compensatory startegies within the kitchen from w/c height.    Baseline  Continue    Time  12    Period  Weeks    Status  On-going    Target Date  04/24/18      OT LONG TERM GOAL #7   Title  Pt. will be able to complete toilet hygiene care with supervision.    Baseline  Pt. is unable to perform the task.    Time  12    Period  Weeks    Status  On-going    Target Date  04/24/18            Plan - 04/16/18 1502    Clinical Impression Statement Pt. complains of right shoulder discomfort today. Pt. reports that motor control is limited today. Pt. continues to work on improving RUE strength, and [facility] skills in order to improve, and maximize independence with opening containers, manipulating buttons, writing legibly, and perfoming toileting skills.     Occupational Profile and client history currently impacting functional performance  Pt. resides with her husband who is a Theme park manager. Pt. has grown children.     Occupational performance deficits (Please refer to evaluation for details):  ADL's;IADL's    Rehab Potential  Good    Clinical Decision Making  Several treatment options, min-mod task modification necessary    OT Frequency  2x / week    OT Duration  12 weeks    OT Treatment/Interventions  Self-care/ADL training;Neuromuscular education;Therapeutic exercise;DME and/or AE  instruction;Visual/perceptual remediation/compensation;Patient/family education;Therapeutic activities    Consulted and Agree with Plan  of Care  Patient       Patient will benefit from skilled therapeutic intervention in order to improve the following deficits and impairments:     Visit Diagnosis: Muscle weakness (generalized)  Other lack of coordination    Problem List Patient Active Problem List   Diagnosis Date Noted  . Pain and swelling of right lower extremity 01/02/2017  . Parkinson's disease ([place]) 01/02/2017    Harrel Carina, MS, OTR/L 04/16/2018, 3:29 PM  [place] MAIN [facility] SERVICES [address] [place], [place], [address] Phone: [phone]   Fax:  [phone]  Name: Arayna Illescas MRN: [id number] Date of Birth: 12-Aug-1940

## 2018-04-16 NOTE — Therapy (Signed)
[place] MAIN [facility] SERVICES [address] [place], [place], [address] Phone: [phone]   Fax:  [phone]  Physical Therapy Treatment  Patient Details  Name: Kelsey [place] MRN: [id number] Date of Birth: March 27, 1940 Referring Provider (PT): Jennelle Human   Encounter Date: 04/16/2018  PT End of Session - 04/16/18 1536    Visit Number  52    Number of Visits  50    Date for PT Re-Evaluation  04/18/18    Authorization Type  goals last assessed 03/12/18    PT Start Time  0310    PT Stop Time  0350    PT Time Calculation (min)  40 min    Equipment Utilized During Treatment  Gait belt       Past Medical History:  Diagnosis Date  . Arthritis   . Bursitis   . CAD (coronary artery disease)   . GERD (gastroesophageal reflux disease)   . Hyperlipidemia   . Hypertension   . Hypophonia   . Intercostal pain   . Parkinson disease ([place])   . Paroxysmal supraventricular tachycardia [facility])     Past Surgical History:  Procedure Laterality Date  . ABDOMINAL HYSTERECTOMY    . CHOLECYSTECTOMY    . DEEP BRAIN STIMULATOR PLACEMENT    . FLEXIBLE SIGMOIDOSCOPY N/A 12/19/2017   Procedure: FLEXIBLE SIGMOIDOSCOPY;  Surgeon: Manya Silvas, MD;  Location: [facility] ENDOSCOPY;  Service: Endoscopy;  Laterality: N/A;  . HEMORROIDECTOMY      There were no vitals filed for this visit.  Subjective Assessment - 04/16/18 1533    Subjective  Patient reported that she has R shoulder pain, that she has had it for about 5 weeks since an injection. Does endorse TTP and some clicking/popping of R shoulder. Pt encouraged to contact PCP if pain continues to update physician.     Patient is accompained by:  Family member    Pertinent History  Patient has had parkinsons disease for the last 20 years. Her mobility has been getting worse after she had a left radius fx.  She was walking with RW in 10/18.  Patient has worseing loss of balance beginning 10/18.  She had HHPT 2  x week for 2 months beginining after hospitilization in Dec 2018.     Patient Stated Goals  Patient wants to be able to walk better and not fall as often    Currently in Pain?  Yes    Pain Score  8     Pain Location  Shoulder    Pain Orientation  Right    Pain Descriptors / Indicators  Aching    Pain Type  Chronic pain    Pain Radiating Towards  shoulder    Pain Onset  More than a month ago    Aggravating Factors   movement    Pain Relieving Factors  medicine    Effect of Pain on Daily Activities  hard to perform transfers    Multiple Pain Sites  No        Therapeutic activities: Nu-step x 5 mins  Patient needs mod assist for transfers sit to stand with vc for sequencing and tactile cues for hand placement, and then is able to progress to C GA with VC for sequencing Patient performs 5 times sit to stand with mod assist with UE support  Gait training: Patient ambulates with rollator and mod assist for assist to push the rollator and mod assist for posterior loss of balance, 50 feet ,  75 feet with wc following behind .  CGA and Min to mod verbal cues used throughout with increased in postural sway and LOB most seen with narrow base of support and while on uneven surfaces. Continues to have balance deficits typical with diagnosis. Patient performs intermediate level exercises with shoulder pain behaviors and needs verbal cuing for postural alignment and head positioning. She has slowness of movements and uncontrolled motor control with stepping and during functional mobility.                         PT Short Term Goals - 03/12/18 1527      PT SHORT TERM GOAL #1   Title  Patient will be independent in home exercise program to improve strength/mobility for better functional independence with ADLs.    Time  4    Period  Weeks    Status  Achieved      PT SHORT TERM GOAL #2   Title  Patient will perform sit to stand with correct hand placement and without  support on the back of the chair with RW  and SBA.     Baseline  Patient needs min assist and needs min assist for correct hand placement. MinAx1 and minimum verbal cues for handplacement with ea attempt.; 03/12/18: requires mod VCs for correct hand placement, and min A for safety    Time  4    Period  Weeks    Status  Not Met    Target Date  03/21/18      PT SHORT TERM GOAL #3   Title  Patient will stand for 1 minute with UE support to be able to perform safe transfers.     Baseline  Patient can stand for 10 seconds; Pt unable to stand without assistance, minAx1 for 30seconds; 03/12/18:     Time  8    Period  Weeks    Status  On-going    Target Date  04/18/18        PT Long Term Goals - 03/12/18 1527      PT LONG TERM GOAL #1   Title  Patient will be able to ambulate in parallel bars x 10 feet with min assist.    Baseline  ; Patient is ambulating wiht rollator 25 feet and min assist,     Time  12    Period  Weeks    Status  Achieved      PT LONG TERM GOAL #2   Title  Patient will ambulate short distances ,25 feet, with RW and mod assist.     Baseline  : 08/22/17 patient is ambulating 15 feet with RW:09/12/17 25 feet with min assist and rollator, 10/25/17, 12/03/17 patient ambulates 100 feet with min assist and rollator, 01/03/18= mod assist for 100 feet with rollator; 19/2020 min-mod assist for 33f    Time  8    Period  Weeks    Status  On-going    Target Date  04/18/18      PT LONG TERM GOAL #3   Title  Patient will be able to ascend 1 step with min assist with LARD.    Baseline  01/03/18=max assist with HHA; 02/20/18: not tested but estimate max A due to sit<>Stand ability and weakness;     Time  12    Period  Weeks    Status  On-going    Target Date  04/18/18      PT LONG TERM  GOAL #4   Title  Patient (> 28 years old) will complete five times sit to stand test in < 15 seconds indicating an increased LE strength and improved balance.    Baseline  01/03/18= 57.45 sec with mod  assist ; 02/21/2018 40 secs with min-modA, posterior lean present with initial standing balance1/28/20: not assessed;     Time  8    Period  Weeks    Status  On-going    Target Date  04/18/18      PT LONG TERM GOAL #5   Title  Patient will be able to perform sit to stand transfer with sBA from [facility]     Baseline  01/03/18 needs mod assist: 02/21/2018 needs min assist and verbal cues for anterior weight shift, scooting to edge of seat, LE positioning1/28/20: minA with mod VCs with rollator    Period  Weeks    Status  On-going    Target Date  04/18/18            Plan - 04/16/18 1542    Clinical Impression Statement  Patient performed sit to stand transfers with VC for correct foot placement, leaning fwd and correct hand placement and CGA x 10 reps. She performed standing with rollator and weight shifting in standing position. She ambulates with rollator 50 feet and 75 feet with mod assist. Patient will conitnue to benefit from skilled PT to improve mobiltiy.     Rehab Potential  Good    PT Frequency  2x / week    PT Duration  8 weeks    PT Treatment/Interventions  Therapeutic exercise;Therapeutic activities;Functional mobility training;Stair training;Balance training;Neuromuscular re-education;Patient/family education;Aquatic Therapy    PT Next Visit Plan  mobility training, strengthening, balanace training    PT Home Exercise Plan  HEP updated, administered (LAQ, seated marching, hip abduction with YTB, ankle/toe raises)    Consulted and Agree with Plan of Care  Patient       Patient will benefit from skilled therapeutic intervention in order to improve the following deficits and impairments:  Decreased balance, Decreased endurance, Decreased mobility, Decreased safety awareness, Decreased strength, Decreased activity tolerance  Visit Diagnosis: Muscle weakness (generalized)  Other lack of coordination  Pain and swelling of right lower extremity  Parkinson's disease  ([place])  Difficulty in walking, not elsewhere classified     Problem List Patient Active Problem List   Diagnosis Date Noted  . Pain and swelling of right lower extremity 01/02/2017  . Parkinson's disease ([place]) 01/02/2017    Alanson Puls, PT DPT 04/16/2018, 3:54 PM  [place] MAIN [facility] SERVICES [address] [place], [place], [address] Phone: [phone]   Fax:  [phone]  Name: Hermelinda Diegel MRN: [id number] Date of Birth: 26-Feb-1940

## 2018-04-18 ENCOUNTER — Encounter: Payer: Self-pay | Admitting: Occupational Therapy

## 2018-04-18 ENCOUNTER — Ambulatory Visit: Payer: Medicare Other

## 2018-04-18 ENCOUNTER — Ambulatory Visit: Payer: Medicare Other | Admitting: Occupational Therapy

## 2018-04-18 DIAGNOSIS — M6281 Muscle weakness (generalized): Secondary | ICD-10-CM

## 2018-04-18 DIAGNOSIS — G20 Parkinson's disease: Secondary | ICD-10-CM

## 2018-04-18 DIAGNOSIS — R262 Difficulty in walking, not elsewhere classified: Secondary | ICD-10-CM

## 2018-04-18 DIAGNOSIS — R278 Other lack of coordination: Secondary | ICD-10-CM

## 2018-04-18 NOTE — Therapy (Signed)
[place] MAIN [facility] SERVICES [address] [place], [place], [address] Phone: [phone]   Fax:  [phone]  Physical Therapy Treatment  Patient Details  Name: Kelsey Owens MRN: [id number] Date of Birth: August 12, 1940 Referring Provider (PT): Jennelle Human   Encounter Date: 04/18/2018  PT End of Session - 04/18/18 1534    Visit Number  48    Number of Visits  50    Date for PT Re-Evaluation  04/18/18    Authorization Type  goals last assessed 04/18/18    Authorization Time Period  Recert 07/21/10-05/19/78    PT Start Time  1515    PT Stop Time  1600    PT Time Calculation (min)  45 min    Activity Tolerance  Patient tolerated treatment well    Behavior During Therapy  [facility] for tasks assessed/performed       Past Medical History:  Diagnosis Date  . Arthritis   . Bursitis   . CAD (coronary artery disease)   . GERD (gastroesophageal reflux disease)   . Hyperlipidemia   . Hypertension   . Hypophonia   . Intercostal pain   . Parkinson disease ([place])   . Paroxysmal supraventricular tachycardia [facility])     Past Surgical History:  Procedure Laterality Date  . ABDOMINAL HYSTERECTOMY    . CHOLECYSTECTOMY    . DEEP BRAIN STIMULATOR PLACEMENT    . FLEXIBLE SIGMOIDOSCOPY N/A 12/19/2017   Procedure: FLEXIBLE SIGMOIDOSCOPY;  Surgeon: Manya Silvas, MD;  Location: [facility] ENDOSCOPY;  Service: Endoscopy;  Laterality: N/A;  . HEMORROIDECTOMY      There were no vitals filed for this visit.  Subjective Assessment - 04/18/18 1523    Subjective  Pt reports she is doing fair. She says her Right shoulder continues to cause a great deal of pain, she think sshe needs to return to her ortopedist.     Currently in Pain?  No/denies    Pain Score  --   Right shoulder pain up to 9/98 with certain movements or donning/doffing clothing.       Therapeutic Exercise:  -Transfer training from transport chair -2xSTS attempted c 4WW, unable to weight shift  correctly -3xSTS with BUE on 12" step for trunk flexion training, mild improvement, limited but SOB -2xSTS from transport chair + 4WW 45sec standing, min-modA for upright stance and halting of retropulsion, pt unable to correct; SOB limiting and Rt knee weakness  -3xSTS c Bilat hands remaining on 4WW seat for FWD weight shift; mina lift facilitation at Right ischial tuberosity and Left tibial block.  -Static stance c 4WW 3x30sec minGuardAssist, eventual retropulsion 2/2 fatigue -Single Leg Press Seat hole 6; 1x10 bilat, 35lbs (VC for full ROM and smooth controlled movement)      PT Short Term Goals - 04/18/18 1556      PT SHORT TERM GOAL #1   Title  Patient will be independent in home exercise program to improve strength/mobility for better functional independence with ADLs.    Time  4    Period  Weeks    Status  Achieved    Target Date  10/25/17      PT SHORT TERM GOAL #2   Title  Patient will perform sit to stand with correct hand placement and without support on the back of the chair with 4WW and SBA.     Baseline  Patient needs min assist and needs min assist for correct hand placement. MinAx1 and verbal cues for handplacement  with each attempt.; 03/12/18: requires mod VCs for correct hand placement, and min A for safety: 04/18/18: pt follows cues well, but overcoming retropulsion remains the biggest hurdle for fully rising inspite of techinique, minA still needed.     Time  4    Period  Weeks    Status  On-going    Target Date  05/19/18      PT SHORT TERM GOAL #3   Title  Patient will stand for 1 minute with UE support to be able to perform safe transfers.     Baseline  Patient can stand for 10 seconds; Pt unable to stand without assistance; 03/12/18 minAx1 for 30seconds; 04/18/18: able to stand 2x30sec at minGuard assist c rollator     Time  8    Period  Weeks    Status  On-going    Target Date  04/18/18        PT Long Term Goals - 04/18/18 1605      PT LONG TERM GOAL #1    Title  Patient will be able to ambulate in parallel bars x 10 feet with min assist.    Baseline  ; Patient is ambulating wiht rollator 25 feet and min assist,     Time  12    Period  Weeks    Status  Achieved      PT LONG TERM GOAL #2   Title  Patient will ambulate short distances 25 feet, with RW and mod assist.     Baseline  : 08/22/17 patient is ambulating 15 feet with RW:09/12/17 25 feet with min assist and rollator, 10/25/17, 12/03/17 patient ambulates 100 feet with min assist and rollator, 01/03/18= mod assist for 100 feet with rollator; 19/2020 min-mod assist for 55f    Time  8    Period  Weeks    Status  Achieved      PT LONG TERM GOAL #3   Title  Patient will be able to ascend 1 step with min assist with LRAD.    Baseline  01/03/18=max assist with HHA; 02/20/18: not tested but estimate max A due to sit<>Stand ability and weakness;     Time  12    Period  Weeks    Status  Unable to assess    Target Date  04/18/18      PT LONG TERM GOAL #4   Title  Patient (> 636years old) will complete five times sit to stand test in < 15 seconds indicating an increased LE strength and improved balance.    Baseline  01/03/18= 57.45 sec with mod assist ; 02/21/2018 40 secs with min-modA, posterior lean present with initial standing balance1/28/20: 04/18/18: unable to perform without physical assistance     Time  8    Period  Weeks    Status  On-going    Target Date  05/19/18      PT LONG TERM GOAL #5   Title  Patient will be [facility] to perform [facility]    Baseline  01/03/18 needs mod assist: 02/21/2018 needs min assist and verbal cues for anterior weight shift, scooting to edge of seat, LE positioning1/28/20: minA with mod VCs with rollator    Time  8    Period  Weeks    Status  On-going    Target Date  05/19/18            Plan - 04/18/18 1609    Clinical Impression  Statement  Goals reassessed this date. Pt continues to improve [place] for transfers indepence and  tolerance to standing for prolonged periods. Transfer continue to be most limited by difficulty overcoming retropulsion, however extensive education over the past few sessions for technique on forward weight shift during STS transfers has yielded improvements in balance during the first 50% of the transfer. The second 50% remains limited by postural limitations in hips and knees and range specific strength deficits that limit pt's ability to rise fully. Additional skilledservices to improve postural mobility and postural strength will help in this area going forward.     Rehab Potential  Fair    PT Frequency  2x / week    PT Duration  8 weeks    PT Treatment/Interventions  Therapeutic exercise;Therapeutic activities;Functional mobility training;Stair training;Balance training;Neuromuscular re-education;Patient/family education;Aquatic Therapy    PT Next Visit Plan  Supine Stretching and postural strengthening to address hip flexor tightness, hip extensor weakness and trunk extensor weakness; continue to progress time in stance and tolerance/independence with transfers.     PT Home Exercise Plan  administered (LAQ, seated marching, hip abduction with YTB, ankle/toe raises)    Consulted and Agree with Plan of Care  Patient       Patient will benefit from skilled therapeutic intervention in order to improve the following deficits and impairments:  Decreased balance, Decreased endurance, Decreased mobility, Decreased safety awareness, Decreased strength, Decreased activity tolerance  Visit Diagnosis: Muscle weakness (generalized)  Difficulty in walking, not elsewhere classified  Parkinson's disease [facility])     Problem List Patient Active Problem List   Diagnosis Date Noted  . Pain and swelling of right lower extremity 01/02/2017  . Parkinson's disease ([place]) 01/02/2017   4:14 PM, 04/18/18 Etta Grandchild, PT, DPT Physical Therapist - [place] [facility] 04/18/2018, 4:14 PM  [place] MAIN [facility] SERVICES [address] [place], [place], [address] Phone: [phone]   Fax:  [phone]  Name: Kelsey Owens MRN: [id number] Date of Birth: 1940-03-07

## 2018-04-18 NOTE — Therapy (Signed)
[place] MAIN [facility] SERVICES [address] [place], [place], [address] Phone: [phone]   Fax:  [phone]  Occupational Therapy Treatment  Patient Details  Name: Eun Vermeer MRN: [id number] Date of Birth: Dec 16, 1940 No data recorded  Encounter Date: 04/18/2018  OT End of Session - 04/18/18 1446    Visit Number  38    Number of Visits  20    Date for OT Re-Evaluation  04/24/18    Authorization Time Period  Progress report period starting 03/14/2018    OT Start Time  1435    OT Stop Time  1515    OT Time Calculation (min)  40 min    Activity Tolerance  Patient tolerated treatment well    Behavior During Therapy  [facility] for tasks assessed/performed       Past Medical History:  Diagnosis Date  . Arthritis   . Bursitis   . CAD (coronary artery disease)   . GERD (gastroesophageal reflux disease)   . Hyperlipidemia   . Hypertension   . Hypophonia   . Intercostal pain   . Parkinson disease ([place])   . Paroxysmal supraventricular tachycardia [facility])     Past Surgical History:  Procedure Laterality Date  . ABDOMINAL HYSTERECTOMY    . CHOLECYSTECTOMY    . DEEP BRAIN STIMULATOR PLACEMENT    . FLEXIBLE SIGMOIDOSCOPY N/A 12/19/2017   Procedure: FLEXIBLE SIGMOIDOSCOPY;  Surgeon: Manya Silvas, MD;  Location: [facility] ENDOSCOPY;  Service: Endoscopy;  Laterality: N/A;  . HEMORROIDECTOMY      There were no vitals filed for this visit.  Subjective Assessment - 04/18/18 1440    Subjective   Pt. reports he is doing well.    Patient is accompanied by:  Family member    Pertinent History  Pt. is a 78 y.o. female with a history of Parkinsons Disease. Pt. has a Deep Brain Stimulator, and has recently had it adjusted. Pt. has had multiple falls, and has a history of multiple wrist fractures.     Patient Stated Goals  To be as independent as possible.     Currently in Pain?  Yes    Pain Score  5     Pain Location  Shoulder    Pain Orientation   Right    Pain Descriptors / Indicators  Aching       OT TREATMENT    Neuro muscular re-education:  Pt. worked on grasping 1" resistive cubes alternating thumb opposition to the tip of the 2nd digit while the board is placed at a vertical angle. Pt. worked on pressing the cubes back into place while alternating isolated 2nd digit extension. Pt. required tactile, verbal cues, and visual demonstration for motor planning through movements.    Therapeutic Exercise:  Pt. Worked on pinch strengthening in the left hand for lateral, and 3pt. pinch using yellow, red, and green resistive clips. Pt. worked on placing the clips at various vertical and horizontal angles. Tactile cues, verbal cues, and visual demonstration were required for eliciting the desired movement.  Self-care:   Pt. worked on Building surveyor, and writing legibility tasks. Using a standard #2 pencil. Pt. was initially able to maintain the line. However, consistently started above the line, and then deviated below the line to the right. Pt. presented with decreased spacing between the letters with the letters of the cursive words running together,  OT Education - 04/18/18 1446    Education Details  [facility] skills    Person(s) Educated  Patient    Methods  Explanation;Demonstration    Comprehension  Verbalized understanding;Returned demonstration          OT Long Term Goals - 01/30/18 1425      OT LONG TERM GOAL #1   Title  Pt. will improve BUE strength by 2 muscle grades to improve ADL, and IADL functioning.    Baseline  01/30/2018: Pt. conitnues to present with weakness in BUEs    Time  12    Period  Weeks    Status  On-going    Target Date  04/24/18      OT LONG TERM GOAL #2   Title  Pt. will increase Left grip strength by 5# to be able to open jars, and containers.    Baseline  01/30/2018: Pt. is now able toopen jars, and containers.    Time  12    Period  Weeks    Status   Partially Met    Target Date  04/24/18      OT LONG TERM GOAL #3   Title  Pt. will improve left hand [facility] skills  to be able to button clothing efficiently    Baseline  01/30/2018: Pt. has difficulty manipulating buttons efficiently.    Time  12    Period  Weeks    Status  On-going    Target Date  04/24/18      OT LONG TERM GOAL #4   Title  Pt. will independently write one sentence efficiently with 75% legibility.    Baseline  01/30/2018: Pt. conitnues to present with decreased writing legibility.    Time  12    Period  Weeks    Status  On-going    Target Date  04/24/18      OT LONG TERM GOAL #5   Title  Pt. independently demonstrate work simplification strategies, and compensatory startegies for safe IADL tasks/home management tasks.         OT LONG TERM GOAL #6   Title  Pt. will independently demonstrate work simplification strategies, and compensatory startegies within the kitchen from w/c height.    Baseline  Continue    Time  12    Period  Weeks    Status  On-going    Target Date  04/24/18      OT LONG TERM GOAL #7   Title  Pt. will be able to complete toilet hygiene care with supervision.    Baseline  Pt. is unable to perform the task.    Time  12    Period  Weeks    Status  On-going    Target Date  04/24/18            Plan - 04/18/18 1450    Clinical Impression Statement  Pt. reports 5/10 pain in the right shoulder. Pt. continues to present with limited UE strength, impaired motor control, and [facility] skills requiring increased cues for direction, and for motor planning movements. Pt. conitnues to work on improving motor control, and coordination skills in order to be able to maipulate buttons, write legibly, and perfom toileting skills.    Occupational Profile and client history currently impacting functional performance  Pt. resides with her husband who is a Theme park manager. Pt. has grown children.     Occupational performance deficits (Please refer to evaluation for  details):  ADL's;IADL's    Clinical Decision Making  Several treatment options, min-mod task modification necessary    OT Frequency  2x / week    OT Duration  12 weeks    OT Treatment/Interventions  Self-care/ADL training;Neuromuscular education;Therapeutic exercise;DME and/or AE instruction;Visual/perceptual remediation/compensation;Patient/family education;Therapeutic activities    Consulted and Agree with Plan of Care  Patient       Patient will benefit from skilled therapeutic intervention in order to improve the following deficits and impairments:     Visit Diagnosis: Muscle weakness (generalized)  Other lack of coordination    Problem List Patient Active Problem List   Diagnosis Date Noted  . Pain and swelling of right lower extremity 01/02/2017  . Parkinson's disease ([place]) 01/02/2017    Harrel Carina, MS, OTR/L 04/18/2018, 3:24 PM  [place] MAIN [facility] SERVICES [address] [place], [place], [address] Phone: [phone]   Fax:  [phone]  Name: Walterine Amodei MRN: [id number] Date of Birth: [birth year]

## 2018-04-22 ENCOUNTER — Other Ambulatory Visit: Payer: Self-pay | Admitting: Physician Assistant

## 2018-04-22 ENCOUNTER — Other Ambulatory Visit (HOSPITAL_COMMUNITY): Payer: Self-pay | Admitting: Physician Assistant

## 2018-04-22 ENCOUNTER — Ambulatory Visit: Payer: Medicare Other | Admitting: Occupational Therapy

## 2018-04-22 DIAGNOSIS — M25511 Pain in right shoulder: Secondary | ICD-10-CM

## 2018-04-24 ENCOUNTER — Ambulatory Visit: Payer: Medicare Other | Admitting: Occupational Therapy

## 2018-04-24 ENCOUNTER — Ambulatory Visit: Payer: Medicare Other | Admitting: Physical Therapy

## 2018-04-30 ENCOUNTER — Encounter: Payer: Medicare Other | Admitting: Occupational Therapy

## 2018-04-30 ENCOUNTER — Ambulatory Visit: Payer: Medicare Other | Admitting: Physical Therapy

## 2018-05-02 ENCOUNTER — Ambulatory Visit: Payer: Medicare Other | Admitting: Occupational Therapy

## 2018-05-02 ENCOUNTER — Ambulatory Visit: Payer: Medicare Other | Admitting: Physical Therapy

## 2018-05-06 ENCOUNTER — Encounter: Payer: Self-pay | Admitting: Physical Therapy

## 2018-05-06 ENCOUNTER — Encounter: Payer: Self-pay | Admitting: Occupational Therapy

## 2018-05-06 NOTE — Therapy (Signed)
[place] MAIN [facility] SERVICES [address] [place], [place], [address] Phone: [phone]   Fax:  [phone]  Patient Details  Name: Katora Fini MRN: [id number] Date of Birth: November 26, 1940 Referring Provider:  No ref. provider found  Encounter Date: 05/06/2018  Reached out to the pt. via the telephone to check in on the pt., informed them that the clinic is closed due to the pandemic, and answer questions about HEPs. Spoke with pt.'s husband.  Harrel Carina, MS, OTR/L 05/06/2018, 2:06 PM  [place] MAIN [facility] SERVICES [address] [place], [place], [address] Phone: [phone]   Fax:  [phone]

## 2018-05-06 NOTE — Therapy (Signed)
[place] MAIN [facility] SERVICES [address] [place], [place], [address] Phone: [phone]   Fax:  [phone]  Patient Details  Name: Kelsey Owens MRN: [id number] Date of Birth: [birth year] Referring Provider:  No ref. provider found  Encounter Date: 05/06/2018 Patient was called to inform that the clinic is closed due to the pandemic and that she will be called when our clinic re opens  Alanson Puls, [place] DPT 05/06/2018, 4:29 PM  [place] [address] [place], [place], [address] Phone: [phone]   Fax:  [phone]

## 2018-05-07 ENCOUNTER — Ambulatory Visit: Payer: Medicare Other | Admitting: Physical Therapy

## 2018-05-07 ENCOUNTER — Ambulatory Visit: Payer: Medicare Other | Admitting: Occupational Therapy

## 2018-05-08 ENCOUNTER — Ambulatory Visit: Payer: Medicare Other

## 2018-05-08 ENCOUNTER — Inpatient Hospital Stay: Admission: RE | Admit: 2018-05-08 | Payer: Medicare Other | Source: Ambulatory Visit

## 2018-05-09 ENCOUNTER — Encounter: Payer: Medicare Other | Admitting: Occupational Therapy

## 2018-05-09 ENCOUNTER — Ambulatory Visit: Payer: Medicare Other | Admitting: Physical Therapy

## 2018-05-14 ENCOUNTER — Ambulatory Visit: Payer: Medicare Other | Admitting: Physical Therapy

## 2018-05-14 ENCOUNTER — Encounter: Payer: Medicare Other | Admitting: Occupational Therapy

## 2018-05-16 ENCOUNTER — Ambulatory Visit: Payer: Medicare Other

## 2018-05-16 ENCOUNTER — Encounter: Payer: Medicare Other | Admitting: Occupational Therapy

## 2018-05-20 ENCOUNTER — Ambulatory Visit: Payer: Medicare Other

## 2018-05-22 ENCOUNTER — Encounter: Payer: Medicare Other | Admitting: Occupational Therapy

## 2018-05-22 ENCOUNTER — Ambulatory Visit: Payer: Medicare Other | Admitting: Physical Therapy

## 2018-05-24 ENCOUNTER — Encounter: Payer: Self-pay | Admitting: Occupational Therapy

## 2018-05-24 NOTE — Therapy (Signed)
[place] MAIN [facility] SERVICES [address] [place], [place], [address] Phone: [phone]   Fax:  [phone]  Patient Details  Name: Kelsey Owens MRN: [id number] Date of Birth: May 22, 1940 Referring Provider:  No ref. provider found  Encounter Date: 05/24/2018  The [facility] outpatient clinics are closed at this time due to the COVID-19 epidemic. The patient was contacted in regards to their therapy services. The patient is in agreement that they are safe and consent to being on hold for therapy services until the [facility] outpatient facilities reopen. At which time, the patient will be contacted to schedule an appointment to resume therapy services.   Harrel Carina, MS, OTR/L 05/24/2018, 3:08 PM  [place] MAIN [facility] SERVICES [address] [place], [place], [address] Phone: [phone]   Fax:  [phone]

## 2018-05-28 ENCOUNTER — Ambulatory Visit: Payer: Medicare Other

## 2018-05-28 ENCOUNTER — Encounter: Payer: Medicare Other | Admitting: Occupational Therapy

## 2018-05-30 ENCOUNTER — Ambulatory Visit: Payer: Medicare Other

## 2018-05-30 ENCOUNTER — Encounter: Payer: Medicare Other | Admitting: Occupational Therapy

## 2018-06-03 ENCOUNTER — Encounter: Payer: Medicare Other | Admitting: Occupational Therapy

## 2018-06-03 ENCOUNTER — Ambulatory Visit: Payer: Medicare Other

## 2018-06-05 ENCOUNTER — Encounter: Payer: Medicare Other | Admitting: Occupational Therapy

## 2018-06-05 ENCOUNTER — Ambulatory Visit: Payer: Medicare Other | Admitting: Physical Therapy

## 2018-06-06 ENCOUNTER — Ambulatory Visit: Admission: RE | Admit: 2018-06-06 | Payer: Medicare Other | Source: Ambulatory Visit

## 2018-06-06 ENCOUNTER — Ambulatory Visit
Admission: RE | Admit: 2018-06-06 | Discharge: 2018-06-06 | Disposition: A | Discharge status: Home / Self Care | Payer: Medicare Other | Source: Ambulatory Visit | Attending: Physician Assistant | Admitting: Physician Assistant

## 2018-06-06 ENCOUNTER — Ambulatory Visit: Payer: Medicare Other

## 2018-06-06 ENCOUNTER — Other Ambulatory Visit: Payer: Self-pay

## 2018-06-06 DIAGNOSIS — M19011 Primary osteoarthritis, right shoulder: Secondary | ICD-10-CM | POA: Insufficient documentation

## 2018-06-06 DIAGNOSIS — M25511 Pain in right shoulder: Secondary | ICD-10-CM | POA: Diagnosis not present

## 2018-06-06 DIAGNOSIS — M6250 Muscle wasting and atrophy, not elsewhere classified, unspecified site: Secondary | ICD-10-CM | POA: Diagnosis not present

## 2018-06-06 DIAGNOSIS — M75121 Complete rotator cuff tear or rupture of right shoulder, not specified as traumatic: Secondary | ICD-10-CM | POA: Diagnosis present

## 2018-06-06 MED ORDER — SODIUM CHLORIDE (PF) 0.9 % IJ SOLN
10.0000 mL | INTRAMUSCULAR | Status: DC | PRN
  Administered 2018-06-06: 14:00:00 5 mL
  Filled 2018-06-06: qty 10

## 2018-06-06 MED ORDER — LIDOCAINE HCL (PF) 1 % IJ SOLN
5.0000 mL | Freq: Once | INTRAMUSCULAR | Status: AC
  Administered 2018-06-06: 5 mL
  Filled 2018-06-06: qty 5

## 2018-06-06 MED ORDER — IOHEXOL 180 MG/ML  SOLN
20.0000 mL | Freq: Once | INTRAMUSCULAR | Status: AC | PRN
  Administered 2018-06-06: 14:00:00 15 mL via INTRA_ARTICULAR

## 2018-06-11 ENCOUNTER — Ambulatory Visit: Payer: Medicare Other

## 2018-06-11 ENCOUNTER — Encounter: Payer: Medicare Other | Admitting: Occupational Therapy

## 2018-06-13 ENCOUNTER — Ambulatory Visit: Payer: Medicare Other

## 2018-06-13 ENCOUNTER — Encounter: Payer: Medicare Other | Admitting: Occupational Therapy

## 2018-06-26 DIAGNOSIS — K5120 Ulcerative (chronic) proctitis without complications: Secondary | ICD-10-CM | POA: Insufficient documentation

## 2018-06-26 DIAGNOSIS — R748 Abnormal levels of other serum enzymes: Secondary | ICD-10-CM | POA: Insufficient documentation

## 2018-07-02 ENCOUNTER — Ambulatory Visit: Payer: Medicare Other | Admitting: Occupational Therapy

## 2018-07-02 ENCOUNTER — Ambulatory Visit: Payer: Medicare Other | Admitting: Physical Therapy

## 2018-07-04 ENCOUNTER — Ambulatory Visit: Payer: Medicare Other | Admitting: Physical Therapy

## 2018-07-04 ENCOUNTER — Ambulatory Visit: Payer: Medicare Other | Admitting: Occupational Therapy

## 2018-07-09 ENCOUNTER — Ambulatory Visit: Payer: Medicare Other | Attending: Family Medicine | Admitting: Occupational Therapy

## 2018-07-09 ENCOUNTER — Other Ambulatory Visit: Payer: Self-pay

## 2018-07-09 ENCOUNTER — Encounter: Payer: Self-pay | Admitting: Physical Therapy

## 2018-07-09 ENCOUNTER — Ambulatory Visit: Payer: Medicare Other | Admitting: Physical Therapy

## 2018-07-09 DIAGNOSIS — M6281 Muscle weakness (generalized): Secondary | ICD-10-CM | POA: Diagnosis present

## 2018-07-09 DIAGNOSIS — M79604 Pain in right leg: Secondary | ICD-10-CM | POA: Insufficient documentation

## 2018-07-09 DIAGNOSIS — G20 Parkinson's disease: Secondary | ICD-10-CM

## 2018-07-09 DIAGNOSIS — M7989 Other specified soft tissue disorders: Secondary | ICD-10-CM | POA: Diagnosis present

## 2018-07-09 DIAGNOSIS — R262 Difficulty in walking, not elsewhere classified: Secondary | ICD-10-CM

## 2018-07-09 DIAGNOSIS — G8929 Other chronic pain: Secondary | ICD-10-CM

## 2018-07-09 DIAGNOSIS — R278 Other lack of coordination: Secondary | ICD-10-CM | POA: Diagnosis present

## 2018-07-09 DIAGNOSIS — M25511 Pain in right shoulder: Secondary | ICD-10-CM | POA: Insufficient documentation

## 2018-07-09 NOTE — Therapy (Signed)
[place] MAIN [facility] SERVICES [address] [place], [place], [address] Phone: [phone]   Fax:  [phone]  Physical Therapy Evaluation  Patient Details  Name: Kelsey Owens MRN: [id number] Date of Birth: [birth year] Referring Provider (PT): Benita Stabile   Encounter Date: 07/09/2018  PT End of Session - 07/09/18 1420    Visit Number  1    Number of Visits  26    Date for PT Re-Evaluation  07/11/18    PT Start Time  0200    PT Stop Time  0245    PT Time Calculation (min)  45 min    Equipment Utilized During Treatment  Gait belt    Activity Tolerance  Patient tolerated treatment well    Behavior During Therapy  [facility] for tasks assessed/performed       Past Medical History:  Diagnosis Date  . Arthritis   . Bursitis   . CAD (coronary artery disease)   . GERD (gastroesophageal reflux disease)   . Hyperlipidemia   . Hypertension   . Hypophonia   . Intercostal pain   . Parkinson disease ([facility])   . Paroxysmal supraventricular tachycardia [facility])     Past Surgical History:  Procedure Laterality Date  . ABDOMINAL HYSTERECTOMY    . CHOLECYSTECTOMY    . DEEP BRAIN STIMULATOR PLACEMENT    . FLEXIBLE SIGMOIDOSCOPY N/A 12/19/2017   Procedure: FLEXIBLE SIGMOIDOSCOPY;  Surgeon: Manya Silvas, MD;  Location: [facility] ENDOSCOPY;  Service: Endoscopy;  Laterality: N/A;  . HEMORROIDECTOMY      There were no vitals filed for this visit.   Subjective Assessment - 07/09/18 1359    Subjective  Patinet reports that her right shoulder continues to hurt her. She is having difficulty with transfers and walking.     Patient is accompained by:  Family member    Pertinent History  Patient has had parkinsons disease for the last 20 years. Her mobility has been getting worse after she had a left radius fx.  She was walking with RW in 10/18.  Patient has worseing loss of balance beginning 10/18.  She had HHPT 2 x week for 2 months beginining after  hospitilization in Dec 2018.     Patient Stated Goals  Patient wants to be able to walk better and not fall as often    Currently in Pain?  Yes    Pain Score  5     Pain Orientation  Right    Pain Descriptors / Indicators  Aching    Pain Onset  More than a month ago    Pain Frequency  Intermittent    Aggravating Factors   certain movements make it worse    Effect of Pain on Daily Activities  difficult to stand up and put weight on it.     Multiple Pain Sites  Yes    Pain Score  3    Pain Location  Knee    Pain Orientation  Right    Pain Descriptors / Indicators  Aching    Pain Onset  More than a month ago    Pain Frequency  Intermittent    Aggravating Factors   she cant tell anything that makes it worse.         [facility] PT Assessment - 07/09/18 1403      Assessment   Medical Diagnosis  Parkinsons    Referring Provider (PT)  Benita Stabile    Hand Dominance  Right    Prior  Therapy  out patient      Precautions   Precautions  Fall      Restrictions   Weight Bearing Restrictions  No      Balance Screen   Has the patient fallen in the past 6 months  Yes    How many times?  1    Has the patient had a decrease in activity level because of a fear of falling?   No    Is the patient reluctant to leave their home because of a fear of falling?   No      Home Environment   Living Environment  Private residence    Living Arrangements  Spouse/significant other    Available Help at Discharge  Family    Type of [place] to enter    Entrance Stairs-Number of Steps  1    Entrance Stairs-Rails  Right    [place]  One level      Prior Function   Level of Independence  Needs assistance with ADLs;Needs assistance with homemaking;Needs assistance with transfers    Vocation  Retired    Leisure  TV, read, children come over      Cognition   Overall Cognitive Status  Within Functional Limits for tasks assessed        PROM/AROM: WFL  STRENGTH:  Graded on a  0-5 scale Muscle Group Left Right                          Hip Flex 3+/5 3+/5  Hip Abd 3/5 3/5  Hip Add 1/5 1/5  Hip Ext NT  NT  Hip IR/ER 2/5 0/5  Knee Flex 3/5 3/5  Knee Ext 3/5 3/5  Ankle DF 3/5 3/5  Ankle PF 3/5 3/5   SENSATION: WNL   FUNCTIONAL MOBILITY: slow mobility with rolling left and right, unable to get into prone  Transfers sit to stand with RW and min assist pushing up from wc arms and 10 attempts. She has decreased motor planning and freezing episodes with BLE  BALANCE:     Static Standing Balance  Normal Able to maintain standing balance against maximal resistance   Good Able to maintain standing balance against moderate resistance   Good-/Fair+ Able to maintain standing balance against minimal resistance   Fair Able to stand unsupported without UE support and without LOB for 1-2 min   Fair- Requires Min A and UE support to maintain standing without loss of balance   Poor+ Requires mod A and UE support to maintain standing without loss of balance   Poor Requires max A and UE support to maintain standing balance without loss x   Dynamic standing : Patient has no dynamic standing balance and is not able to stand without max assist support.      Static Sitting Balance  Normal Able to maintain balance against maximal resistance   Good Able to maintain balance against moderate resistance x  Good-/Fair+ Accepts minimal resistance   Fair Able to sit unsupported without balance loss and without UE support   Poor+ Able to maintain with Minimal assistance from individual or chair   Poor Unable to maintain balance-requires mod/max support from individual or chair        Dynamic Sitting Balance  Normal Able to sit unsupported and weight shift across midline maximally   Good Able to sit unsupported and weight shift across midline moderately  x  Good-/Fair+ Able to sit unsupported and weight shift across midline minimally    Fair Minimal weight shifting ipsilateral/front, difficulty crossing midline   Fair- Reach to ipsilateral side and unable to weight shift   Poor + Able to sit unsupported with min A and reach to ipsilateral side, unable to weight shift   Poor Able to sit unsupported with mod A and reach ipsilateral/front-can't cross midline     GAIT: Patient was able to ambulate 40 feet in parallel bars with reports of pain in B hips   Treatment:  Transfer training from wc <> mat with mod assist, wc<> stand with min assist and VC for technique and sequencin Standing in parallel bars x 1 1/2 mins with wc behind         Objective measurements completed on examination: See above findings.              PT Education - 07/09/18 1419    Education Details  plan of care    Person(s) Educated  Patient    Methods  Explanation    Comprehension  Verbalized understanding       PT Short Term Goals - 07/09/18 1423      PT SHORT TERM GOAL #1   Title  Patient will be independent in home exercise program to improve strength/mobility for better functional independence with ADLs.    Time  6    Period  Weeks    Status  New    Target Date  08/20/17      PT SHORT TERM GOAL #2   Title  Patient will perform sit to stand with correct hand placement and without support on the back of the chair with 4WW and SBA.     Time  6    Period  Weeks    Status  New    Target Date  08/20/17      PT SHORT TERM GOAL #3   Title  Patient will stand for 1 minute with UE support to be able to perform safe transfers.     Baseline  Patient can stand for 10 seconds; Pt unable to stand without assistance; 03/12/18 minAx1 for 30seconds; 04/18/18: able to stand 2x30sec at minGuard assist c rollator     Time  6    Period  Weeks    Status  New    Target Date  08/20/17        PT Long Term Goals - 07/09/18 1421      PT LONG TERM GOAL #1   Title  Patient will be able to ambulate in parallel bars x 10 feet with  min assist.    Time  12    Period  Weeks    Status  New    Target Date  10/01/18      PT LONG TERM GOAL #2   Title  Patient will ambulate short distances 25 feet, with RW and mod assist.     Time  12    Period  Weeks    Status  New    Target Date  10/01/18      PT LONG TERM GOAL #3   Title  Patient will be able to ascend 1 step with min assist with LRAD.    Time  12    Period  Weeks    Status  New    Target Date  10/01/18      PT LONG TERM GOAL #4   Title  Patient will  be able to perform sit to stand transfer with sBA from [facility]     Time  12    Period  Weeks    Status  New    Target Date  10/01/18             Plan - 07/09/18 1427    Clinical Impression Statement  Patient is 78 year old female with dx of parkinsons disease and has decreased BLE strength, decreased BUE strength, decreased mobility including transfers with mod assist and standing with mod assist and RW. She has right shoulder pain with decreased AROM  125 flex, 105  deg abd,. She has pain in right knee and hip and needs assist to straighten her RLE in supine due to right hip flexor tightness; she has RLE hip ER tightness and weakness in right hip . She is able to transfer after 10 attempts from wc to standing in parallel bars with CGA. She can walk in parallel bars with sBA 40 feet. She will benefit from skilled PT to improve mobility,saftey and strength.    Rehab Potential  Fair    PT Frequency  2x / week    PT Duration  12 weeks    PT Treatment/Interventions  Therapeutic exercise;Therapeutic activities;Functional mobility training;Stair training;Balance training;Neuromuscular re-education;Patient/family education;Aquatic Therapy    PT Next Visit Plan  Supine Stretching and postural strengthening to address hip flexor tightness, hip extensor weakness and trunk extensor weakness; continue to progress time in stance and tolerance/independence with transfers.     PT Home Exercise Plan  administered (LAQ, seated  marching, hip abduction with YTB, ankle/toe raises)    Consulted and Agree with Plan of Care  Patient       Patient will benefit from skilled therapeutic intervention in order to improve the following deficits and impairments:  Decreased balance, Decreased endurance, Decreased mobility, Decreased safety awareness, Decreased strength, Decreased activity tolerance  Visit Diagnosis: Muscle weakness (generalized)  Difficulty in walking, not elsewhere classified  Parkinson's disease ([facility])  Other lack of coordination  Pain and swelling of right lower extremity  Chronic right shoulder pain     Problem List Patient Active Problem List   Diagnosis Date Noted  . Pain and swelling of right lower extremity 01/02/2017  . Parkinson's disease ([place]) 01/02/2017    Alanson Puls,   PT DPT 07/09/2018, 2:48 PM  [place] MAIN [facility] SERVICES [address] [place], [place], [address] Phone: [phone]   Fax:  [phone]  Name: Xiadani Damman MRN: [id number] Date of Birth: [birth year]

## 2018-07-09 NOTE — Therapy (Signed)
[place] MAIN [facility] SERVICES [address] [place], [place], [address] Phone: [phone]   Fax:  [phone]  Occupational Therapy Treatment/Recertification   Patient Details  Name: Kelsey Owens MRN: [id number] Date of Birth: 10-17-1940 No data recorded  Encounter Date: 07/09/2018  OT End of Session - 07/14/18 1334    Visit Number  39    Number of Visits  6    Date for OT Re-Evaluation  10/01/18    Authorization Time Period  Progress report period starting 07/09/2018    OT Start Time  1300    OT Stop Time  1345    OT Time Calculation (min)  45 min    Activity Tolerance  Patient tolerated treatment well    Behavior During Therapy  [facility] for tasks assessed/performed       Past Medical History:  Diagnosis Date  . Arthritis   . Bursitis   . CAD (coronary artery disease)   . GERD (gastroesophageal reflux disease)   . Hyperlipidemia   . Hypertension   . Hypophonia   . Intercostal pain   . Parkinson disease ([place])   . Paroxysmal supraventricular tachycardia [facility])     Past Surgical History:  Procedure Laterality Date  . ABDOMINAL HYSTERECTOMY    . CHOLECYSTECTOMY    . DEEP BRAIN STIMULATOR PLACEMENT    . FLEXIBLE SIGMOIDOSCOPY N/A 12/19/2017   Procedure: FLEXIBLE SIGMOIDOSCOPY;  Surgeon: Manya Silvas, MD;  Location: [facility] ENDOSCOPY;  Service: Endoscopy;  Laterality: N/A;  . HEMORROIDECTOMY      There were no vitals filed for this visit.  Subjective Assessment - 07/14/18 1332    Subjective   Patient reports she has regressed since not coming to therapy for the last couple months during the pandemic.     Pertinent History  Pt. is a 78 y.o. female with a history of Parkinsons Disease. Pt. has a Deep Brain Stimulator, and has recently had it adjusted. Pt. has had multiple falls, and has a history of multiple wrist fractures.     Patient Stated Goals  To be as independent as possible.     Currently in Pain?  No/denies    Pain  Score  0-No pain       Patient reports she has declined since not coming to therapy, difficulty with getting ready in the am, takes increased time, difficulty with pulling toilet paper off the roll,  Dressing from a seated position but takes her more time to complete.   Reassessment as follows:   Grip strength  Right 40# Left 26#  Lateral pinch  Right 13# Left 13# 3 point pinch Right 11# Left 14# 2 point pinch Right 11# Left 11#  9 hole peg test Right 38 secs Left secs 59    Handwriting:  Samples taken with right hand to produce list from copy.  Patient seen for handwriting skills with cues for size of letters.  Homework issued with focus on letters of name.  Patient does better with printing than script.                        OT Education - 07/14/18 1334    Education Details  progress, update of goals, handwriting    Person(s) Educated  Patient    Methods  Explanation;Demonstration    Comprehension  Verbalized understanding;Returned demonstration          OT Long Term Goals - 07/14/18 1336  OT LONG TERM GOAL #1   Title  Pt. will improve BUE strength by 2 muscle grades to improve ADL, and IADL functioning.    Baseline  01/30/2018: Pt. conitnues to present with weakness in BUEs    Time  12    Period  Weeks    Status  On-going      OT LONG TERM GOAL #2   Title  Pt. will increase Left grip strength by 5# to be able to open jars, and containers.    Baseline  01/30/2018: Pt. is now able toopen jars, and containers.    Time  12    Period  Weeks    Status  Partially Met      OT LONG TERM GOAL #3   Title  Pt. will improve left hand [facility] skills  to be able to button clothing efficiently    Baseline  01/30/2018: Pt. has difficulty manipulating buttons efficiently.    Time  12    Period  Weeks    Status  On-going      OT LONG TERM GOAL #4   Title  Pt. will independently write one sentence efficiently with 75% legibility.    Baseline   01/30/2018: Pt. conitnues to present with decreased writing legibility.    Time  12    Period  Weeks    Status  On-going      OT LONG TERM GOAL #5   Title  Pt. independently demonstrate work simplification strategies, and compensatory startegies for safe IADL tasks/home management tasks.         OT LONG TERM GOAL #6   Title  Pt. will independently demonstrate work simplification strategies, and compensatory startegies within the kitchen from w/c height.    Baseline  Continue    Time  12    Period  Weeks    Status  On-going      OT LONG TERM GOAL #7   Title  Pt. will be able to complete toilet hygiene care with supervision.    Baseline  Pt. is unable to perform the task.    Time  12    Period  Weeks    Status  On-going            Plan - 07/14/18 1336    Clinical Impression Statement  Patient reports increased difficulty with self care tasks including the strength and coordination to tear off toilet paper when performing toilet hygiene.  Patient would also like to improve her handwriting skills to be able to take notes in church and send notes to friends and church members.  Patient demonstrates some decline over the last couple months without any therapy intervention and would benefit from continued therapy to increase independence in daily tasks.     Occupational Profile and client history currently impacting functional performance  Pt. resides with her husband who is a Theme park manager. Pt. has grown children.     Occupational performance deficits (Please refer to evaluation for details):  ADL's;IADL's    Rehab Potential  Good    Clinical Decision Making  Several treatment options, min-mod task modification necessary    OT Frequency  2x / week    OT Duration  12 weeks    OT Treatment/Interventions  Self-care/ADL training;Neuromuscular education;Therapeutic exercise;DME and/or AE instruction;Visual/perceptual remediation/compensation;Patient/family education;Therapeutic activities     Consulted and Agree with Plan of Care  Patient       Patient will benefit from skilled therapeutic intervention in order to improve the following  deficits and impairments:     Visit Diagnosis: Muscle weakness (generalized)  Parkinson's disease ([place])  Other lack of coordination    Problem List Patient Active Problem List   Diagnosis Date Noted  . Pain and swelling of right lower extremity 01/02/2017  . Parkinson's disease ([place]) 01/02/2017   Zaynab Chipman T Tomasita Morrow, OTR/L, CLT  Trennon Torbeck 07/14/2018, 1:49 PM  [place] MAIN [facility] SERVICES [address] [place], [place], [address] Phone: [phone]   Fax:  [phone]  Name: Lanecia Sliva MRN: [id number] Date of Birth: [birth year]

## 2018-07-11 ENCOUNTER — Other Ambulatory Visit: Payer: Self-pay

## 2018-07-11 ENCOUNTER — Ambulatory Visit: Payer: Medicare Other | Admitting: Physical Therapy

## 2018-07-11 DIAGNOSIS — G20 Parkinson's disease: Secondary | ICD-10-CM

## 2018-07-11 DIAGNOSIS — M6281 Muscle weakness (generalized): Secondary | ICD-10-CM | POA: Diagnosis not present

## 2018-07-11 DIAGNOSIS — R278 Other lack of coordination: Secondary | ICD-10-CM

## 2018-07-11 DIAGNOSIS — M25511 Pain in right shoulder: Secondary | ICD-10-CM

## 2018-07-11 DIAGNOSIS — R262 Difficulty in walking, not elsewhere classified: Secondary | ICD-10-CM

## 2018-07-11 DIAGNOSIS — G8929 Other chronic pain: Secondary | ICD-10-CM

## 2018-07-11 NOTE — Therapy (Signed)
[place] MAIN [facility] SERVICES [address] [place], [place], [address] Phone: [phone]   Fax:  [phone]  Physical Therapy Treatment  Patient Details  Name: Kelsey Owens MRN: [id number] Date of Birth: 06-27-1940 Referring Provider (PT): Benita Stabile   Encounter Date: 07/11/2018  PT End of Session - 07/11/18 1423    Visit Number  2    Number of Visits  26    Date for PT Re-Evaluation  07/11/18    PT Start Time  0205    PT Stop Time  0245    PT Time Calculation (min)  40 min    Equipment Utilized During Treatment  Gait belt    Activity Tolerance  Patient tolerated treatment well    Behavior During Therapy  [facility] for tasks assessed/performed       Past Medical History:  Diagnosis Date  . Arthritis   . Bursitis   . CAD (coronary artery disease)   . GERD (gastroesophageal reflux disease)   . Hyperlipidemia   . Hypertension   . Hypophonia   . Intercostal pain   . Parkinson disease ([place])   . Paroxysmal supraventricular tachycardia [facility])     Past Surgical History:  Procedure Laterality Date  . ABDOMINAL HYSTERECTOMY    . CHOLECYSTECTOMY    . DEEP BRAIN STIMULATOR PLACEMENT    . FLEXIBLE SIGMOIDOSCOPY N/A 12/19/2017   Procedure: FLEXIBLE SIGMOIDOSCOPY;  Surgeon: Manya Silvas, MD;  Location: [facility] ENDOSCOPY;  Service: Endoscopy;  Laterality: N/A;  . HEMORROIDECTOMY      There were no vitals filed for this visit.  Subjective Assessment - 07/11/18 1422    Subjective  Patinet reports that her right shoulder continues to hurt her. She is having difficulty with transfers and walking.     Patient is accompained by:  Family member    Pertinent History  Patient has had parkinsons disease for the last 20 years. Her mobility has been getting worse after she had a left radius fx.  She was walking with RW in 10/18.  Patient has worseing loss of balance beginning 10/18.  She had HHPT 2 x week for 2 months beginining after  hospitilization in Dec 2018.     Patient Stated Goals  Patient wants to be able to walk better and not fall as often    Currently in Pain?  Yes    Pain Score  2     Pain Location  Shoulder    Pain Orientation  Right    Pain Descriptors / Indicators  Aching    Pain Type  Chronic pain    Pain Onset  More than a month ago    Aggravating Factors   using it    Pain Relieving Factors  medicine    Multiple Pain Sites  No    Pain Onset  More than a month ago       Treatment: Transfer training from chair to stand with min assist  Standing with Rw with min assist and cues for posture and sequencing x 2 mins 45 sec, 1 min 30 sec x 2 Transfer training from wc to mat with mod assist with max assist for sequencing BLE hip flex x 15  BLE knee extension with overpressure on thighs to stop substitution x 15 x 2 BLE  Patient needs cues for correct technique and safety.  PT Education - 07/11/18 1423    Education Details  HEP    Person(s) Educated  Patient    Methods  Explanation    Comprehension  Verbalized understanding       PT Short Term Goals - 07/09/18 1423      PT SHORT TERM GOAL #1   Title  Patient will be independent in home exercise program to improve strength/mobility for better functional independence with ADLs.    Time  6    Period  Weeks    Status  New    Target Date  08/20/17      PT SHORT TERM GOAL #2   Title  Patient will perform sit to stand with correct hand placement and without support on the back of the chair with 4WW and SBA.     Time  6    Period  Weeks    Status  New    Target Date  08/20/17      PT SHORT TERM GOAL #3   Title  Patient will stand for 1 minute with UE support to be able to perform safe transfers.     Baseline  Patient can stand for 10 seconds; Pt unable to stand without assistance; 03/12/18 minAx1 for 30seconds; 04/18/18: able to stand 2x30sec at minGuard assist c rollator     Time  6    Period  Weeks     Status  New    Target Date  08/20/17        PT Long Term Goals - 07/09/18 1421      PT LONG TERM GOAL #1   Title  Patient will be able to ambulate in parallel bars x 10 feet with min assist.    Time  12    Period  Weeks    Status  New    Target Date  10/01/18      PT LONG TERM GOAL #2   Title  Patient will ambulate short distances 25 feet, with RW and mod assist.     Time  12    Period  Weeks    Status  New    Target Date  10/01/18      PT LONG TERM GOAL #3   Title  Patient will be able to ascend 1 step with min assist with LRAD.    Time  12    Period  Weeks    Status  New    Target Date  10/01/18      PT LONG TERM GOAL #4   Title  Patient will be able to perform sit to stand transfer with sBA from [facility]     Time  12    Period  Weeks    Status  New    Target Date  10/01/18            Plan - 07/11/18 1454    Clinical Impression Statement  Patient performed transfers wc <> stand with RW wiht min assist and max assist for sequencing and safety. She is able to sttand for 1 1;2 mins x 2 and 2 mins 45 sec with Rw and SBA. She performs BLE knee extension exercises without reports of pain. She will continue to benefit from skilled PT to improve strength and balance.     Rehab Potential  Fair    PT Frequency  2x / week    PT Duration  12 weeks    PT Treatment/Interventions  Therapeutic exercise;Therapeutic activities;Functional mobility training;Stair training;Balance training;Neuromuscular  re-education;Patient/family education;Aquatic Therapy    PT Next Visit Plan  Supine Stretching and postural strengthening to address hip flexor tightness, hip extensor weakness and trunk extensor weakness; continue to progress time in stance and tolerance/independence with transfers.     PT Home Exercise Plan  administered (LAQ, seated marching, hip abduction with YTB, ankle/toe raises)    Consulted and Agree with Plan of Care  Patient       Patient will benefit from skilled therapeutic  intervention in order to improve the following deficits and impairments:  Decreased balance, Decreased endurance, Decreased mobility, Decreased safety awareness, Decreased strength, Decreased activity tolerance  Visit Diagnosis: Muscle weakness (generalized)  Difficulty in walking, not elsewhere classified  Parkinson's disease ([place])  Other lack of coordination  Chronic right shoulder pain     Problem List Patient Active Problem List   Diagnosis Date Noted  . Pain and swelling of right lower extremity 01/02/2017  . Parkinson's disease ([place]) 01/02/2017    Alanson Puls, PT DPT 07/11/2018, 2:57 PM  [place] MAIN [facility] SERVICES [address] [place], [place], [address] Phone: [phone]   Fax:  [phone]  Name: Kelsey Owens MRN: [id number] Date of Birth: Nov 14, 1940

## 2018-07-14 ENCOUNTER — Encounter: Payer: Self-pay | Admitting: Occupational Therapy

## 2018-07-16 ENCOUNTER — Ambulatory Visit: Payer: Medicare Other | Admitting: Physical Therapy

## 2018-07-16 ENCOUNTER — Encounter: Payer: Self-pay | Admitting: Occupational Therapy

## 2018-07-16 ENCOUNTER — Ambulatory Visit: Payer: Medicare Other | Attending: Family Medicine | Admitting: Occupational Therapy

## 2018-07-16 ENCOUNTER — Other Ambulatory Visit: Payer: Self-pay

## 2018-07-16 DIAGNOSIS — M6281 Muscle weakness (generalized): Secondary | ICD-10-CM | POA: Diagnosis present

## 2018-07-16 DIAGNOSIS — G20 Parkinson's disease: Secondary | ICD-10-CM | POA: Diagnosis present

## 2018-07-16 DIAGNOSIS — R278 Other lack of coordination: Secondary | ICD-10-CM | POA: Diagnosis present

## 2018-07-16 DIAGNOSIS — M25511 Pain in right shoulder: Secondary | ICD-10-CM | POA: Diagnosis present

## 2018-07-16 DIAGNOSIS — G8929 Other chronic pain: Secondary | ICD-10-CM

## 2018-07-16 DIAGNOSIS — R262 Difficulty in walking, not elsewhere classified: Secondary | ICD-10-CM | POA: Insufficient documentation

## 2018-07-16 NOTE — Therapy (Signed)
[place] MAIN [facility] SERVICES [address] [place], [place], [address] Phone: [phone]   Fax:  [phone]  Physical Therapy Treatment  Patient Details  Name: Kelsey Owens MRN: [id number] Date of Birth: Mar 30, 1940 Referring Provider (PT): Benita Stabile   Encounter Date: 07/16/2018  PT End of Session - 07/16/18 1417    Visit Number  3    Number of Visits  26    Date for PT Re-Evaluation  10/01/18    PT Start Time  0200    PT Stop Time  0240    PT Time Calculation (min)  40 min    Equipment Utilized During Treatment  Gait belt    Activity Tolerance  Patient tolerated treatment well    Behavior During Therapy  [facility] for tasks assessed/performed       Past Medical History:  Diagnosis Date  . Arthritis   . Bursitis   . CAD (coronary artery disease)   . GERD (gastroesophageal reflux disease)   . Hyperlipidemia   . Hypertension   . Hypophonia   . Intercostal pain   . Parkinson disease ([place])   . Paroxysmal supraventricular tachycardia [facility])     Past Surgical History:  Procedure Laterality Date  . ABDOMINAL HYSTERECTOMY    . CHOLECYSTECTOMY    . DEEP BRAIN STIMULATOR PLACEMENT    . FLEXIBLE SIGMOIDOSCOPY N/A 12/19/2017   Procedure: FLEXIBLE SIGMOIDOSCOPY;  Surgeon: Manya Silvas, MD;  Location: [facility] ENDOSCOPY;  Service: Endoscopy;  Laterality: N/A;  . HEMORROIDECTOMY      There were no vitals filed for this visit.  Subjective Assessment - 07/16/18 1413    Subjective  Patinet reports that her right shoulder continues to hurt her. She is having difficulty with transfers and walking.     Patient is accompained by:  Family member    Pertinent History  Patient has had parkinsons disease for the last 20 years. Her mobility has been getting worse after she had a left radius fx.  She was walking with RW in 10/18.  Patient has worseing loss of balance beginning 10/18.  She had HHPT 2 x week for 2 months beginining after hospitilization  in Dec 2018.     Patient Stated Goals  Patient wants to be able to walk better and not fall as often    Currently in Pain?  Yes    Pain Score  4     Pain Location  Shoulder    Pain Orientation  Right    Pain Descriptors / Indicators  Aching    Pain Onset  More than a month ago    Pain Onset  More than a month ago       Treatment: Transfer training from chair to stand with min assist , vC for sequencing and hand on trunk to assist with leaning fwd x 5 reps Standing with Rw with min assist and cues for posture and sequencing x 2 mins 20 sec, 1 min 30 sec x 2, 1 min with VC for correct posture Transfer training from wc to mat with mod assist with max assist for sequencing Leg press 45 lbs x 20 x 3 sets BLE knee extension with overpressure on thighs to stop substitution x 15 x 2 BLE  Patient needs cues for correct technique and safety                        PT Education - 07/16/18 1414  Education Details  HEP    Person(s) Educated  Patient    Methods  Explanation    Comprehension  Verbalized understanding;Returned demonstration       PT Short Term Goals - 07/09/18 1423      PT SHORT TERM GOAL #1   Title  Patient will be independent in home exercise program to improve strength/mobility for better functional independence with ADLs.    Time  6    Period  Weeks    Status  New    Target Date  08/20/17      PT SHORT TERM GOAL #2   Title  Patient will perform sit to stand with correct hand placement and without support on the back of the chair with 4WW and SBA.     Time  6    Period  Weeks    Status  New    Target Date  08/20/17      PT SHORT TERM GOAL #3   Title  Patient will stand for 1 minute with UE support to be able to perform safe transfers.     Baseline  Patient can stand for 10 seconds; Pt unable to stand without assistance; 03/12/18 minAx1 for 30seconds; 04/18/18: able to stand 2x30sec at minGuard assist c rollator     Time  6    Period  Weeks     Status  New    Target Date  08/20/17        PT Long Term Goals - 07/09/18 1421      PT LONG TERM GOAL #1   Title  Patient will be able to ambulate in parallel bars x 10 feet with min assist.    Time  12    Period  Weeks    Status  New    Target Date  10/01/18      PT LONG TERM GOAL #2   Title  Patient will ambulate short distances 25 feet, with RW and mod assist.     Time  12    Period  Weeks    Status  New    Target Date  10/01/18      PT LONG TERM GOAL #3   Title  Patient will be able to ascend 1 step with min assist with LRAD.    Time  12    Period  Weeks    Status  New    Target Date  10/01/18      PT LONG TERM GOAL #4   Title  Patient will be able to perform sit to stand transfer with sBA from [facility]     Time  12    Period  Weeks    Status  New    Target Date  10/01/18              Patient will benefit from skilled therapeutic intervention in order to improve the following deficits and impairments:     Visit Diagnosis: Muscle weakness (generalized)  Parkinson's disease ([place])  Other lack of coordination  Difficulty in walking, not elsewhere classified  Chronic right shoulder pain     Problem List Patient Active Problem List   Diagnosis Date Noted  . Pain and swelling of right lower extremity 01/02/2017  . Parkinson's disease ([place]) 01/02/2017    Alanson Puls, PT DPT 07/16/2018, 2:24 PM  [place] MAIN [facility] SERVICES [address] [place], [place], [address] Phone: [phone]   Fax:  [phone]  Name: [name]  Hong Timm MRN: [id number] Date of Birth: [birth year]

## 2018-07-16 NOTE — Therapy (Signed)
[place] [facility] MAIN REHAB SERVICES [address] , , [address] Phone: [phone]   Fax:  [phone]  Occupational Therapy Treatment/10th visit Update Period from 07/09/2018 to 07/16/2018   Patient Details  Name: Kelsey Owens MRN: [id number] Date of Birth: 11/27/1940 No data recorded  Encounter Date: 07/16/2018  OT End of Session - 07/18/18 1316    Visit Number  40    Number of Visits  48    Date for OT Re-Evaluation  10/01/18    Authorization Time Period  Progress report period starting 07/09/2018    OT Start Time  1300    OT Stop Time  1345    OT Time Calculation (min)  45 min    Activity Tolerance  Patient tolerated treatment well    Behavior During Therapy  [facility] for tasks assessed/performed       Past Medical History:  Diagnosis Date  . Arthritis   . Bursitis   . CAD (coronary artery disease)   . GERD (gastroesophageal reflux disease)   . Hyperlipidemia   . Hypertension   . Hypophonia   . Intercostal pain   . Parkinson disease ([facility])   . Paroxysmal supraventricular tachycardia ([facility])     Past Surgical History:  Procedure Laterality Date  . ABDOMINAL HYSTERECTOMY    . CHOLECYSTECTOMY    . DEEP BRAIN STIMULATOR PLACEMENT    . FLEXIBLE SIGMOIDOSCOPY N/A 12/19/2017   Procedure: FLEXIBLE SIGMOIDOSCOPY;  Surgeon: Elliott, Robert T, MD;  Location: [facility] ENDOSCOPY;  Service: Endoscopy;  Laterality: N/A;  . HEMORROIDECTOMY      There were no vitals filed for this visit.  Subjective Assessment - 07/18/18 1315    Subjective   Patient reports she has pain in her right shoulder, 8/10 would like to try some moist heat today.    Pertinent History  Pt. is a 78 y.o. female with a history of Parkinsons Disease. Pt. has a Deep Brain Stimulator, and has recently had it adjusted. Pt. has had multiple falls, and has a history of multiple wrist fractures.     Patient Stated Goals  To be as independent as possible.     Currently in  Pain?  Yes    Pain Score  4     Pain Location  Shoulder    Pain Orientation  Right    Pain Descriptors / Indicators  Aching    Pain Type  Chronic pain    Pain Onset  More than a month ago    Pain Frequency  Intermittent        Patient reports she has had a difficult time with her right shoulder and reports she went to the ortho MD and states she really needs to have surgery but she doesn't really want to proceed with it at this time.  Pain is 8/10 and affecting her ability to perform self care and daily tasks.    Moist heat to right shoulder for 5 minutes prior to neuromuscular reedcuation tasks to decrease pain and increase motion for reach.    NeuroMuscular re education: Patient seen for manipulation of ball pegs to pick up and place into board with cues for prehension patterns on the right.   Manipulation of glass beads with emphasis on translatory movements of the hand, using the hand for storage and prehension patterns, therapist demo and cues required.   Progressed to smaller items less than 1/2 inch in size with Small earring back of various styles and sizes to   pick up and sort into a 8 compartment container.  Patient demonstrates increased difficulty with smaller objects and often puts into the wrong slot and requires increased verbal cues to correct.    ADLs: Handwriting skills:  Patient seen for additional focus on handwriting today. When asked to formulate a list of 10 animals she had difficulty completing a list of 10. Pt required cues for completing the list, able to formulate 5 of 10 independently. Handwriting was illegible with writing in script. When cued to focus on increasing size of letters and use of print, her legibility improved by 50%. Added homework this day to formulate two lists. One list to include fruits in one list to include vegetables. Patient instructed to use lined paper, print letters/words, and used increased size of letters for improved legibility.     Response to tx:   Patient has demonstrated a slight decline in skills since onset of pandemic and OP clinic was closed.  Pt did not feel she could participate in telehealth services and no home health.  She continues to complain of pain in the right shoulder and reports she will likely need surgical intervention for the pain to improve however she does not feel she wants to pursue any surgical intervention at this time.  Patient continues to demonstrate decreased ROM in right shoulder (flexion to 90 degrees with pain worsening).  She has poor legibility with handwriting when writing in script but when focused, cues for amplitude of letter size and printing, the legibility improves greatly.  Continue to work towards goals to increase independence in daily tasks.                      OT Education - 07/18/18 1316    Education Details  fine motor coordination tasks, handwriting skills    Person(s) Educated  Patient    Methods  Explanation;Demonstration    Comprehension  Verbalized understanding;Returned demonstration          OT Long Term Goals - 07/18/18 1316      OT LONG TERM GOAL #1   Title  Pt. will improve BUE strength by 2 muscle grades to improve ADL, and IADL functioning.    Baseline  01/30/2018: Pt. conitnues to present with weakness in BUEs    Time  12    Period  Weeks    Status  On-going      OT LONG TERM GOAL #2   Title  Pt. will increase Left grip strength by 5# to be able to open jars, and containers.    Baseline  01/30/2018: Pt. is now able toopen jars, and containers.    Time  12    Period  Weeks    Status  Partially Met      OT LONG TERM GOAL #3   Title  Pt. will improve left hand [facility] skills  to be able to button clothing efficiently    Baseline  01/30/2018: Pt. has difficulty manipulating buttons efficiently.    Time  12    Period  Weeks    Status  On-going      OT LONG TERM GOAL #4   Title  Pt. will independently write one sentence  efficiently with 75% legibility.    Baseline  01/30/2018: Pt. conitnues to present with decreased writing legibility.    Time  12    Period  Weeks    Status  On-going      OT LONG TERM GOAL #5  Title  Pt. independently demonstrate work simplification strategies, and compensatory startegies for safe IADL tasks/home management tasks.         OT LONG TERM GOAL #6   Title  Pt. will independently demonstrate work simplification strategies, and compensatory startegies within the kitchen from w/c height.    Baseline  Continue    Time  12    Period  Weeks    Status  On-going      OT LONG TERM GOAL #7   Title  Pt. will be able to complete toilet hygiene care with supervision.    Baseline  Pt. is unable to perform the task.    Time  12    Period  Weeks    Status  On-going            Plan - 07/18/18 1331    Clinical Impression Statement  Patient has demonstrated a slight decline in skills since onset of pandemic and OP clinic was closed.  Pt did not feel she could participate in telehealth services and no home health.  She continues to complain of pain in the right shoulder and reports she will likely need surgical intervention for the pain to improve however she does not feel she wants to pursue any surgical intervention at this time.  Patient continues to demonstrate decreased ROM in right shoulder (flexion to 90 degrees with pain worsening).  She has poor legibility with handwriting when writing in script but when focused, cues for amplitude of letter size and printing, the legibility improves greatly.  Continue to work towards goals to increase independence in daily tasks.     Occupational Profile and client history currently impacting functional performance  Pt. resides with her husband who is a pastor. Pt. has grown children.     Occupational performance deficits (Please refer to evaluation for details):  ADL's;IADL's    Rehab Potential  Good    Clinical Decision Making  Several  treatment options, min-mod task modification necessary    OT Frequency  2x / week    OT Duration  12 weeks    OT Treatment/Interventions  Self-care/ADL training;Neuromuscular education;Therapeutic exercise;DME and/or AE instruction;Visual/perceptual remediation/compensation;Patient/family education;Therapeutic activities    Consulted and Agree with Plan of Care  Patient       Patient will benefit from skilled therapeutic intervention in order to improve the following deficits and impairments:     Visit Diagnosis: Other lack of coordination  Muscle weakness (generalized)  Parkinson's disease ([facility])    Problem List Patient Active Problem List   Diagnosis Date Noted  . Pain and swelling of right lower extremity 01/02/2017  . Parkinson's disease ([facility]) 01/02/2017    T , OTR/L, CLT  , 07/18/2018, 1:31 PM  [place] [facility] MAIN REHAB SERVICES [address] [place], [place], [address] Phone: [phone]   Fax:  [phone]  Name: Tima Powers Shafran MRN: [id number] Date of Birth: 08/12/1940 

## 2018-07-18 ENCOUNTER — Encounter: Payer: Self-pay | Admitting: Physical Therapy

## 2018-07-18 ENCOUNTER — Ambulatory Visit: Payer: Medicare Other | Admitting: Physical Therapy

## 2018-07-18 ENCOUNTER — Encounter: Payer: Self-pay | Admitting: Occupational Therapy

## 2018-07-18 ENCOUNTER — Other Ambulatory Visit: Payer: Self-pay

## 2018-07-18 ENCOUNTER — Ambulatory Visit: Payer: Medicare Other | Admitting: Occupational Therapy

## 2018-07-18 DIAGNOSIS — G8929 Other chronic pain: Secondary | ICD-10-CM

## 2018-07-18 DIAGNOSIS — R278 Other lack of coordination: Secondary | ICD-10-CM

## 2018-07-18 DIAGNOSIS — M6281 Muscle weakness (generalized): Secondary | ICD-10-CM

## 2018-07-18 DIAGNOSIS — G20A1 Parkinson's disease without dyskinesia, without mention of fluctuations: Secondary | ICD-10-CM

## 2018-07-18 DIAGNOSIS — R262 Difficulty in walking, not elsewhere classified: Secondary | ICD-10-CM

## 2018-07-18 DIAGNOSIS — G20 Parkinson's disease: Secondary | ICD-10-CM

## 2018-07-18 NOTE — Therapy (Signed)
[place] MAIN [facility] SERVICES [address] [place], [place], [address] Phone: [phone]   Fax:  [phone]  Physical Therapy Treatment  Patient Details  Name: Kelsey Owens MRN: [id number] Date of Birth: [birth year] Referring Provider (PT): Benita Stabile   Encounter Date: 07/18/2018  PT End of Session - 07/18/18 1413    Visit Number  4    Number of Visits  26    Date for PT Re-Evaluation  10/01/18    PT Start Time  0200    PT Stop Time  0240    PT Time Calculation (min)  40 min    Equipment Utilized During Treatment  Gait belt    Activity Tolerance  Patient tolerated treatment well    Behavior During Therapy  [facility] for tasks assessed/performed       Past Medical History:  Diagnosis Date  . Arthritis   . Bursitis   . CAD (coronary artery disease)   . GERD (gastroesophageal reflux disease)   . Hyperlipidemia   . Hypertension   . Hypophonia   . Intercostal pain   . Parkinson disease ([place])   . Paroxysmal supraventricular tachycardia [facility])     Past Surgical History:  Procedure Laterality Date  . ABDOMINAL HYSTERECTOMY    . CHOLECYSTECTOMY    . DEEP BRAIN STIMULATOR PLACEMENT    . FLEXIBLE SIGMOIDOSCOPY N/A 12/19/2017   Procedure: FLEXIBLE SIGMOIDOSCOPY;  Surgeon: Manya Silvas, MD;  Location: [facility] ENDOSCOPY;  Service: Endoscopy;  Laterality: N/A;  . HEMORROIDECTOMY      There were no vitals filed for this visit.  Subjective Assessment - 07/18/18 1412    Subjective  Patinet reports that her right shoulder continues to hurt her. She is having difficulty with transfers and walking.     Patient is accompained by:  Family member    Pertinent History  Patient has had parkinsons disease for the last 20 years. Her mobility has been getting worse after she had a left radius fx.  She was walking with RW in 10/18.  Patient has worseing loss of balance beginning 10/18.  She had HHPT 2 x week for 2 months beginining after hospitilization  in Dec 2018.     Patient Stated Goals  Patient wants to be able to walk better and not fall as often    Currently in Pain?  Yes    Pain Score  8     Pain Location  Shoulder    Pain Orientation  Right    Pain Descriptors / Indicators  Aching    Pain Type  Chronic pain    Pain Onset  More than a month ago    Pain Frequency  Intermittent    Multiple Pain Sites  No    Pain Onset  More than a month ago      Therapeutic exercise: Nu-step x 5 mins L 2 with LUE only  Side lying hip abd x 10 x 3 BLE  SLR with assist to keep knee in extension x 10 x 2  Bridging x 10 x 3  Hooklying marching x 10 x 3  Hooklying trunk rotation left and right   Therapeutic activities; Transfer training with mod cueing for correct hand placement and foot sequencing from wc<> nu-step, wc<> mat and min assist.   Pt educated throughout session about proper posture and technique with exercises. Improved exercise technique, movement at target joints, use of target muscles after min to mod verbal, visual, tactile cues.  PT Education - 07/18/18 1413    Education Details  HEP    Person(s) Educated  Patient    Methods  Explanation    Comprehension  Verbalized understanding;Returned demonstration;Need further instruction       PT Short Term Goals - 07/09/18 1423      PT SHORT TERM GOAL #1   Title  Patient will be independent in home exercise program to improve strength/mobility for better functional independence with ADLs.    Time  6    Period  Weeks    Status  New    Target Date  08/20/17      PT SHORT TERM GOAL #2   Title  Patient will perform sit to stand with correct hand placement and without support on the back of the chair with 4WW and SBA.     Time  6    Period  Weeks    Status  New    Target Date  08/20/17      PT SHORT TERM GOAL #3   Title  Patient will stand for 1 minute with UE support to be able to perform safe transfers.     Baseline  Patient can  stand for 10 seconds; Pt unable to stand without assistance; 03/12/18 minAx1 for 30seconds; 04/18/18: able to stand 2x30sec at minGuard assist c rollator     Time  6    Period  Weeks    Status  New    Target Date  08/20/17        PT Long Term Goals - 07/09/18 1421      PT LONG TERM GOAL #1   Title  Patient will be able to ambulate in parallel bars x 10 feet with min assist.    Time  12    Period  Weeks    Status  New    Target Date  10/01/18      PT LONG TERM GOAL #2   Title  Patient will ambulate short distances 25 feet, with RW and mod assist.     Time  12    Period  Weeks    Status  New    Target Date  10/01/18      PT LONG TERM GOAL #3   Title  Patient will be able to ascend 1 step with min assist with LRAD.    Time  12    Period  Weeks    Status  New    Target Date  10/01/18      PT LONG TERM GOAL #4   Title  Patient will be able to perform sit to stand transfer with sBA from [facility]     Time  12    Period  Weeks    Status  New    Target Date  10/01/18            Plan - 07/18/18 1414    Clinical Impression Statement  Patient performs BLE strengthening exercises with mod verbal cues for correct technique. She has decreased motor control and weakness that impacts her transfers and standing abiity. She transfers with min assist from wc to nu-step to wc , wc<> mat. She will continue to benefit from skilled PT to improve strength and mobility.     Rehab Potential  Fair    PT Frequency  2x / week    PT Duration  12 weeks    PT Treatment/Interventions  Therapeutic exercise;Therapeutic activities;Functional mobility training;Stair training;Balance training;Neuromuscular re-education;Patient/family education;Aquatic Therapy  PT Next Visit Plan  Supine Stretching and postural strengthening to address hip flexor tightness, hip extensor weakness and trunk extensor weakness; continue to progress time in stance and tolerance/independence with transfers.     PT Home Exercise Plan   administered (LAQ, seated marching, hip abduction with YTB, ankle/toe raises)    Consulted and Agree with Plan of Care  Patient       Patient will benefit from skilled therapeutic intervention in order to improve the following deficits and impairments:  Decreased balance, Decreased endurance, Decreased mobility, Decreased safety awareness, Decreased strength, Decreased activity tolerance  Visit Diagnosis: Muscle weakness (generalized)  Parkinson's disease ([place])  Other lack of coordination  Difficulty in walking, not elsewhere classified  Chronic right shoulder pain     Problem List Patient Active Problem List   Diagnosis Date Noted  . Pain and swelling of right lower extremity 01/02/2017  . Parkinson's disease ([place]) 01/02/2017    Alanson Puls, PT DPT 07/18/2018, 2:18 PM  [place] MAIN [facility] SERVICES [address] [place], [place], [address] Phone: [phone]   Fax:  [phone]  Name: Kelsey Owens MRN: [id number] Date of Birth: Sep 01, 1940

## 2018-07-18 NOTE — Therapy (Signed)
[place] MAIN [facility] SERVICES [address] [place], [place], [address] Phone: [phone]   Fax:  [phone]  Occupational Therapy Treatment  Patient Details  Name: Kelsey Owens MRN: [id number] Date of Birth: 1940/09/27 No data recorded  Encounter Date: 07/18/2018  OT End of Session - 07/18/18 1425    Visit Number  41    Number of Visits  55    Date for OT Re-Evaluation  10/01/18    Authorization Time Period  Progress report period starting 07/16/2018    OT Start Time  1308    OT Stop Time  1358    OT Time Calculation (min)  50 min    Activity Tolerance  Patient tolerated treatment well    Behavior During Therapy  [facility] for tasks assessed/performed       Past Medical History:  Diagnosis Date  . Arthritis   . Bursitis   . CAD (coronary artery disease)   . GERD (gastroesophageal reflux disease)   . Hyperlipidemia   . Hypertension   . Hypophonia   . Intercostal pain   . Parkinson disease ([place])   . Paroxysmal supraventricular tachycardia [facility])     Past Surgical History:  Procedure Laterality Date  . ABDOMINAL HYSTERECTOMY    . CHOLECYSTECTOMY    . DEEP BRAIN STIMULATOR PLACEMENT    . FLEXIBLE SIGMOIDOSCOPY N/A 12/19/2017   Procedure: FLEXIBLE SIGMOIDOSCOPY;  Surgeon: Manya Silvas, MD;  Location: [facility] ENDOSCOPY;  Service: Endoscopy;  Laterality: N/A;  . HEMORROIDECTOMY      There were no vitals filed for this visit.  Subjective Assessment - 07/18/18 1424    Subjective   Patient reports she continues to have chronic shoulder pain on the right.  Heat feels good while its on but did not have any lasting effects last session.     Patient is accompanied by:  Family member    Pertinent History  Pt. is a 78 y.o. female with a history of Parkinsons Disease. Pt. has a Deep Brain Stimulator, and has recently had it adjusted. Pt. has had multiple falls, and has a history of multiple wrist fractures.     Patient Stated Goals  To be  as independent as possible.     Currently in Pain?  Yes    Pain Score  8     Pain Location  Shoulder    Pain Orientation  Right    Pain Descriptors / Indicators  Aching    Pain Type  Chronic pain         Neuromuscular Reeducation:  Patient seen this date for emphasis on fine motor coordination and manipulation skills with right hand with use of small bulletin board push pin magnets, cues for using one handed technique to separate magnets with isolated finger movements for thumb, middle finger and index to stabilize.  Required therapist demo, tactile cues and verbal cues.  Once stacks of magnets were separated, patient placed onto magnetic board with encouraged reach.     ADLs/Handwriting:  Patient seen for handwriting skills today with formulation of 2 lists, one for 10 fruits and the other for 10 vegetables.  Patient could only think of 4 items in each category and required cues and coaching to come up with the rest.  Moderate to max cues for size of letter formation, printing and strategies for improved legibility.  One item on vegetable list was added twice however patient did not realize the mistake.    Response to tx:  Patient continues to require moderate cues, verbal and tactile for fine motor coordination tasks.  If given increased time, patient tends to lose attention to task and is unsure of what to do despite repetition.  Patient's legibility continues to remain poor but is able to improve with strategies but requires continual cuing.  Continue to work towards goals, requested patient go back and perform homework from last session since she did not do it at home.                     OT Education - 07/18/18 1425    Education Details  homework for handwriting     Person(s) Educated  Patient    Methods  Explanation;Demonstration    Comprehension  Verbalized understanding;Returned demonstration          OT Long Term Goals - 07/18/18 1316      OT LONG TERM GOAL  #1   Title  Pt. will improve BUE strength by 2 muscle grades to improve ADL, and IADL functioning.    Baseline  01/30/2018: Pt. conitnues to present with weakness in BUEs    Time  12    Period  Weeks    Status  On-going      OT LONG TERM GOAL #2   Title  Pt. will increase Left grip strength by 5# to be able to open jars, and containers.    Baseline  01/30/2018: Pt. is now able toopen jars, and containers.    Time  12    Period  Weeks    Status  Partially Met      OT LONG TERM GOAL #3   Title  Pt. will improve left hand [facility] skills  to be able to button clothing efficiently    Baseline  01/30/2018: Pt. has difficulty manipulating buttons efficiently.    Time  12    Period  Weeks    Status  On-going      OT LONG TERM GOAL #4   Title  Pt. will independently write one sentence efficiently with 75% legibility.    Baseline  01/30/2018: Pt. conitnues to present with decreased writing legibility.    Time  12    Period  Weeks    Status  On-going      OT LONG TERM GOAL #5   Title  Pt. independently demonstrate work simplification strategies, and compensatory startegies for safe IADL tasks/home management tasks.         OT LONG TERM GOAL #6   Title  Pt. will independently demonstrate work simplification strategies, and compensatory startegies within the kitchen from w/c height.    Baseline  Continue    Time  12    Period  Weeks    Status  On-going      OT LONG TERM GOAL #7   Title  Pt. will be able to complete toilet hygiene care with supervision.    Baseline  Pt. is unable to perform the task.    Time  12    Period  Weeks    Status  On-going            Plan - 07/18/18 1426    Clinical Impression Statement   Patient continues to require moderate cues, verbal and tactile for fine motor coordination tasks.  If given increased time, patient tends to lose attention to task and is unsure of what to do despite repetition.  Patient's legibility continues to remain poor but is able  to  improve with strategies but requires continual cuing.  Continue to work towards goals, requested patient go back and perform homework from last session since she did not do it at home.      Occupational Profile and client history currently impacting functional performance  Pt. resides with her husband who is a Theme park manager. Pt. has grown children.     Occupational performance deficits (Please refer to evaluation for details):  ADL's;IADL's    Rehab Potential  Good    Clinical Decision Making  Several treatment options, min-mod task modification necessary    OT Frequency  2x / week    OT Duration  12 weeks    OT Treatment/Interventions  Self-care/ADL training;Neuromuscular education;Therapeutic exercise;DME and/or AE instruction;Visual/perceptual remediation/compensation;Patient/family education;Therapeutic activities    Consulted and Agree with Plan of Care  Patient       Patient will benefit from skilled therapeutic intervention in order to improve the following deficits and impairments:     Visit Diagnosis: Muscle weakness (generalized)  Parkinson's disease ([place])  Other lack of coordination    Problem List Patient Active Problem List   Diagnosis Date Noted  . Pain and swelling of right lower extremity 01/02/2017  . Parkinson's disease ([place]) 01/02/2017   Kinesha Auten T Tomasita Morrow, OTR/L, CLT  Treysean Petruzzi 07/18/2018, 5:36 PM  [place] MAIN [facility] SERVICES [address] [place], [place], [address] Phone: [phone]   Fax:  [phone]  Name: Kelsey Owens MRN: [id number] Date of Birth: [birth year]

## 2018-07-23 ENCOUNTER — Encounter: Payer: Self-pay | Admitting: Physical Therapy

## 2018-07-23 ENCOUNTER — Other Ambulatory Visit: Payer: Self-pay

## 2018-07-23 ENCOUNTER — Ambulatory Visit: Payer: Medicare Other | Admitting: Physical Therapy

## 2018-07-23 DIAGNOSIS — G8929 Other chronic pain: Secondary | ICD-10-CM

## 2018-07-23 DIAGNOSIS — M6281 Muscle weakness (generalized): Secondary | ICD-10-CM

## 2018-07-23 DIAGNOSIS — R262 Difficulty in walking, not elsewhere classified: Secondary | ICD-10-CM

## 2018-07-23 DIAGNOSIS — R278 Other lack of coordination: Secondary | ICD-10-CM

## 2018-07-23 DIAGNOSIS — G20 Parkinson's disease: Secondary | ICD-10-CM

## 2018-07-23 NOTE — Therapy (Signed)
[place] MAIN [facility] SERVICES [address] [place], [place], [address] Phone: [phone]   Fax:  [phone]  Physical Therapy Treatment  Patient Details  Name: Kelsey Owens MRN: [id number] Date of Birth: Sep 26, 1940 Referring Provider (PT): Benita Stabile   Encounter Date: 07/23/2018  PT End of Session - 07/23/18 1412    Visit Number  5    Number of Visits  26    Date for PT Re-Evaluation  10/01/18    PT Start Time  0200    PT Stop Time  0245    PT Time Calculation (min)  45 min    Equipment Utilized During Treatment  Gait belt    Activity Tolerance  Patient tolerated treatment well    Behavior During Therapy  [facility] for tasks assessed/performed       Past Medical History:  Diagnosis Date  . Arthritis   . Bursitis   . CAD (coronary artery disease)   . GERD (gastroesophageal reflux disease)   . Hyperlipidemia   . Hypertension   . Hypophonia   . Intercostal pain   . Parkinson disease ([place])   . Paroxysmal supraventricular tachycardia [facility])     Past Surgical History:  Procedure Laterality Date  . ABDOMINAL HYSTERECTOMY    . CHOLECYSTECTOMY    . DEEP BRAIN STIMULATOR PLACEMENT    . FLEXIBLE SIGMOIDOSCOPY N/A 12/19/2017   Procedure: FLEXIBLE SIGMOIDOSCOPY;  Surgeon: Manya Silvas, MD;  Location: [facility] ENDOSCOPY;  Service: Endoscopy;  Laterality: N/A;  . HEMORROIDECTOMY      There were no vitals filed for this visit.  Subjective Assessment - 07/23/18 1410    Subjective  Patinet reports that her right shoulder continues to hurt her. She is having difficulty with transfers and walking.     Patient is accompained by:  Family member    Pertinent History  Patient has had parkinsons disease for the last 20 years. Her mobility has been getting worse after she had a left radius fx.  She was walking with RW in 10/18.  Patient has worseing loss of balance beginning 10/18.  She had HHPT 2 x week for 2 months beginining after hospitilization  in Dec 2018.     Patient Stated Goals  Patient wants to be able to walk better and not fall as often    Currently in Pain?  Yes    Pain Score  3     Pain Location  Shoulder    Pain Orientation  Right    Pain Descriptors / Indicators  Aching    Pain Type  Chronic pain    Pain Onset  More than a month ago    Effect of Pain on Daily Activities  difficulty with using it    Pain Onset  More than a month ago        Treatment: Gait training with rollator and min assist with cues for upright posture and safety 50 feet x 2   Transfer training from wc to stand with difficulty with initial static standing posture and needs extra time to gain static standing balance and stability.  Transfer training sit to stand x 5 reps x 2 sets , cues for leaning fwd, hand placement  Therapeutic exercise:  Nu-step x 5 mins UE and LE   LAQ with assist to keep her leg  in correct position. X 15 x 2 sets  Standing with RW and vc for correct posture   Pt educated throughout session about proper  posture and technique with exercises. Improved exercise technique, movement at target joints, use of target muscles after min to mod verbal, visual, tactile cues.                      PT Education - 07/23/18 1411    Education Details  hep    Person(s) Educated  Patient    Methods  Explanation    Comprehension  Verbalized understanding;Returned demonstration;Need further instruction       PT Short Term Goals - 07/09/18 1423      PT SHORT TERM GOAL #1   Title  Patient will be independent in home exercise program to improve strength/mobility for better functional independence with ADLs.    Time  6    Period  Weeks    Status  New    Target Date  08/20/17      PT SHORT TERM GOAL #2   Title  Patient will perform sit to stand with correct hand placement and without support on the back of the chair with 4WW and SBA.     Time  6    Period  Weeks    Status  New    Target Date  08/20/17       PT SHORT TERM GOAL #3   Title  Patient will stand for 1 minute with UE support to be able to perform safe transfers.     Baseline  Patient can stand for 10 seconds; Pt unable to stand without assistance; 03/12/18 minAx1 for 30seconds; 04/18/18: able to stand 2x30sec at minGuard assist c rollator     Time  6    Period  Weeks    Status  New    Target Date  08/20/17        PT Long Term Goals - 07/09/18 1421      PT LONG TERM GOAL #1   Title  Patient will be able to ambulate in parallel bars x 10 feet with min assist.    Time  12    Period  Weeks    Status  New    Target Date  10/01/18      PT LONG TERM GOAL #2   Title  Patient will ambulate short distances 25 feet, with RW and mod assist.     Time  12    Period  Weeks    Status  New    Target Date  10/01/18      PT LONG TERM GOAL #3   Title  Patient will be able to ascend 1 step with min assist with LRAD.    Time  12    Period  Weeks    Status  New    Target Date  10/01/18      PT LONG TERM GOAL #4   Title  Patient will be able to perform sit to stand transfer with sBA from [facility]     Time  12    Period  Weeks    Status  New    Target Date  10/01/18            Plan - 07/23/18 1412    Clinical Impression Statement  Patient instructed in transfer training and gait training wiht rollator with VC for sequencing and saftey 50 feet.     Rehab Potential  Fair    PT Frequency  2x / week    PT Duration  12 weeks    PT Treatment/Interventions  Therapeutic exercise;Therapeutic  activities;Functional mobility training;Stair training;Balance training;Neuromuscular re-education;Patient/family education;Aquatic Therapy    PT Next Visit Plan  Supine Stretching and postural strengthening to address hip flexor tightness, hip extensor weakness and trunk extensor weakness; continue to progress time in stance and tolerance/independence with transfers.     PT Home Exercise Plan  administered (LAQ, seated marching, hip abduction with YTB,  ankle/toe raises)    Consulted and Agree with Plan of Care  Patient       Patient will benefit from skilled therapeutic intervention in order to improve the following deficits and impairments:  Decreased balance, Decreased endurance, Decreased mobility, Decreased safety awareness, Decreased strength, Decreased activity tolerance  Visit Diagnosis: Muscle weakness (generalized)  Parkinson's disease ([place])  Other lack of coordination  Difficulty in walking, not elsewhere classified  Chronic right shoulder pain     Problem List Patient Active Problem List   Diagnosis Date Noted  . Pain and swelling of right lower extremity 01/02/2017  . Parkinson's disease ([place]) 01/02/2017    Alanson Puls, PT DPT 07/23/2018, 2:19 PM  [place] MAIN [facility] SERVICES [address] [place], [place], [address] Phone: [phone]   Fax:  [phone]  Name: Kelsey Owens MRN: [id number] Date of Birth: 10-04-1940

## 2018-07-25 ENCOUNTER — Other Ambulatory Visit: Payer: Self-pay

## 2018-07-25 ENCOUNTER — Encounter: Payer: Self-pay | Admitting: Physical Therapy

## 2018-07-25 ENCOUNTER — Ambulatory Visit: Payer: Medicare Other | Admitting: Occupational Therapy

## 2018-07-25 ENCOUNTER — Ambulatory Visit: Payer: Medicare Other | Admitting: Physical Therapy

## 2018-07-25 ENCOUNTER — Encounter: Payer: Self-pay | Admitting: Occupational Therapy

## 2018-07-25 DIAGNOSIS — R278 Other lack of coordination: Secondary | ICD-10-CM

## 2018-07-25 DIAGNOSIS — G20 Parkinson's disease: Secondary | ICD-10-CM

## 2018-07-25 DIAGNOSIS — M6281 Muscle weakness (generalized): Secondary | ICD-10-CM

## 2018-07-25 DIAGNOSIS — R262 Difficulty in walking, not elsewhere classified: Secondary | ICD-10-CM

## 2018-07-25 NOTE — Therapy (Signed)
[place] MAIN [facility] SERVICES [address] [place], [place], [address] Phone: [phone]   Fax:  [phone]  Occupational Therapy Treatment  Patient Details  Name: Kelsey Owens MRN: [id number] Date of Birth: January 15, 1941 No data recorded  Encounter Date: 07/25/2018  OT End of Session - 07/30/18 2006    Visit Number  42    Number of Visits  40    Date for OT Re-Evaluation  10/01/18    Authorization Time Period  Progress report period starting 07/16/2018    OT Start Time  1301    OT Stop Time  1350    OT Time Calculation (min)  49 min    Activity Tolerance  Patient tolerated treatment well    Behavior During Therapy  [facility] for tasks assessed/performed       Past Medical History:  Diagnosis Date  . Arthritis   . Bursitis   . CAD (coronary artery disease)   . GERD (gastroesophageal reflux disease)   . Hyperlipidemia   . Hypertension   . Hypophonia   . Intercostal pain   . Parkinson disease ([place])   . Paroxysmal supraventricular tachycardia [facility])     Past Surgical History:  Procedure Laterality Date  . ABDOMINAL HYSTERECTOMY    . CHOLECYSTECTOMY    . DEEP BRAIN STIMULATOR PLACEMENT    . FLEXIBLE SIGMOIDOSCOPY N/A 12/19/2017   Procedure: FLEXIBLE SIGMOIDOSCOPY;  Surgeon: Manya Silvas, MD;  Location: [facility] ENDOSCOPY;  Service: Endoscopy;  Laterality: N/A;  . HEMORROIDECTOMY      There were no vitals filed for this visit.  Subjective Assessment - 07/30/18 2005    Subjective   Patient reports she brought in her homework from last week.  Her right shoulder still hurts, 3/10 pain, shooting down from the shoulder towards elbow.    Pertinent History  Pt. is a 78 y.o. female with a history of Parkinsons Disease. Pt. has a Deep Brain Stimulator, and has recently had it adjusted. Pt. has had multiple falls, and has a history of multiple wrist fractures.     Patient Stated Goals  To be as independent as possible.     Pain Score  3      Pain Location  Shoulder    Pain Orientation  Right    Pain Descriptors / Indicators  Aching;Shooting    Pain Type  Chronic pain    Pain Onset  More than a month ago        Neuromuscular reeducation:  Patient seen for coordination skills with right and left UE with use of [place] discs, alternating patterns, one handed, two handed, simultaneously, etc. Manipulation of small 1/2 inch washers to pick up from magnetic bowl and place onto hook placed in elevated plane of reach.       Handwriting skills: Patient brought in homework from last week, she wrote in script and legibility was extremely poor.  Patient did not stay within the lines of the paper and also did not use larger size letters as instructed in the clinic.  Worked on Engineer, [employer] in the clinic, cues for staying on the lines, larger size of letter formation, and use of print.  Legibility greatly improved.     Response to tx: Patient continues to have chronic right shoulder pain which limits her participation in overhead reaching tasks affecting some of her ADLs.  She responds well to cues however, needs repeated cues throughout task to be consistent.  Patient with poor legibility with  homework but much improved with strategies in the clinic.  Will attempt to give specific directions with homework to see if this will help provide more structure to the task.  Continue to work towards goals in [place] to increase independence in necessary daily tasks.             OT Education - 07/30/18 2006    Education Details  use of lined paper and printing for homework    Person(s) Educated  Patient    Methods  Explanation;Demonstration    Comprehension  Verbalized understanding;Returned demonstration          OT Long Term Goals - 07/18/18 1316      OT LONG TERM GOAL #1   Title  Pt. will improve BUE strength by 2 muscle grades to improve ADL, and IADL functioning.    Baseline  01/30/2018: Pt. conitnues to present with  weakness in BUEs    Time  12    Period  Weeks    Status  On-going      OT LONG TERM GOAL #2   Title  Pt. will increase Left grip strength by 5# to be able to open jars, and containers.    Baseline  01/30/2018: Pt. is now able toopen jars, and containers.    Time  12    Period  Weeks    Status  Partially Met      OT LONG TERM GOAL #3   Title  Pt. will improve left hand [facility] skills  to be able to button clothing efficiently    Baseline  01/30/2018: Pt. has difficulty manipulating buttons efficiently.    Time  12    Period  Weeks    Status  On-going      OT LONG TERM GOAL #4   Title  Pt. will independently write one sentence efficiently with 75% legibility.    Baseline  01/30/2018: Pt. conitnues to present with decreased writing legibility.    Time  12    Period  Weeks    Status  On-going      OT LONG TERM GOAL #5   Title  Pt. independently demonstrate work simplification strategies, and compensatory startegies for safe IADL tasks/home management tasks.         OT LONG TERM GOAL #6   Title  Pt. will independently demonstrate work simplification strategies, and compensatory startegies within the kitchen from w/c height.    Baseline  Continue    Time  12    Period  Weeks    Status  On-going      OT LONG TERM GOAL #7   Title  Pt. will be able to complete toilet hygiene care with supervision.    Baseline  Pt. is unable to perform the task.    Time  12    Period  Weeks    Status  On-going            Plan - 07/30/18 2007    Clinical Impression Statement  Patient continues to have chronic right shoulder pain which limits her participation in overhead reaching tasks affecting some of her ADLs.  She responds well to cues however, needs repeated cues throughout task to be consistent.  Patient with poor legibility with homework but much improved with strategies in the clinic.  Will attempt to give specific directions with homework to see if this will help provide more structure to  the task.  Continue to work towards goals in [place] to increase independence  in necessary daily tasks.    Occupational Profile and client history currently impacting functional performance  Pt. resides with her husband who is a Theme park manager. Pt. has grown children.     Occupational performance deficits (Please refer to evaluation for details):  ADL's;IADL's    Rehab Potential  Good    Clinical Decision Making  Several treatment options, min-mod task modification necessary    OT Frequency  2x / week    OT Duration  12 weeks    OT Treatment/Interventions  Self-care/ADL training;Neuromuscular education;Therapeutic exercise;DME and/or AE instruction;Visual/perceptual remediation/compensation;Patient/family education;Therapeutic activities    Consulted and Agree with Plan of Care  Patient       Patient will benefit from skilled therapeutic intervention in order to improve the following deficits and impairments:     Visit Diagnosis: Other lack of coordination -  Parkinson's disease ([place]) -  Muscle weakness (generalized) -    Problem List Patient Active Problem List   Diagnosis Date Noted  . Pain and swelling of right lower extremity 01/02/2017  . Parkinson's disease ([place]) 01/02/2017   Cristel Rail T Tomasita Morrow, OTR/L, CLT  Panagiotis Oelkers 07/30/2018, 8:17 PM  [place] MAIN [facility] SERVICES [address] [place], [place], [address] Phone: [phone]   Fax:  [phone]  Name: Kelsey Owens MRN: [id number] Date of Birth: 02/04/1941

## 2018-07-25 NOTE — Therapy (Signed)
[place] MAIN [facility] SERVICES [address] [place], [place], [address] Phone: [phone]   Fax:  [phone]  Physical Therapy Treatment  Patient Details  Name: Kelsey Owens MRN: [id number] Date of Birth: 1940/03/18 Referring Provider (PT): Benita Stabile   Encounter Date: 07/25/2018  PT End of Session - 07/25/18 1401    Visit Number  6    Number of Visits  26    Date for PT Re-Evaluation  10/01/18    PT Start Time  0155    PT Stop Time  0235    PT Time Calculation (min)  40 min    Equipment Utilized During Treatment  Gait belt    Activity Tolerance  Patient tolerated treatment well    Behavior During Therapy  [facility] for tasks assessed/performed       Past Medical History:  Diagnosis Date  . Arthritis   . Bursitis   . CAD (coronary artery disease)   . GERD (gastroesophageal reflux disease)   . Hyperlipidemia   . Hypertension   . Hypophonia   . Intercostal pain   . Parkinson disease ([place])   . Paroxysmal supraventricular tachycardia [facility])     Past Surgical History:  Procedure Laterality Date  . ABDOMINAL HYSTERECTOMY    . CHOLECYSTECTOMY    . DEEP BRAIN STIMULATOR PLACEMENT    . FLEXIBLE SIGMOIDOSCOPY N/A 12/19/2017   Procedure: FLEXIBLE SIGMOIDOSCOPY;  Surgeon: Manya Silvas, MD;  Location: [facility] ENDOSCOPY;  Service: Endoscopy;  Laterality: N/A;  . HEMORROIDECTOMY      There were no vitals filed for this visit.  Subjective Assessment - 07/25/18 1400    Subjective  Patinet reports that her right shoulder continues to hurt her. She is having difficulty with transfers and walking.     Patient is accompained by:  Family member    Pertinent History  Patient has had parkinsons disease for the last 20 years. Her mobility has been getting worse after she had a left radius fx.  She was walking with RW in 10/18.  Patient has worseing loss of balance beginning 10/18.  She had HHPT 2 x week for 2 months beginining after  hospitilization in Dec 2018.     Patient Stated Goals  Patient wants to be able to walk better and not fall as often    Currently in Pain?  Yes    Pain Score  3     Pain Location  Shoulder    Pain Orientation  Right    Pain Descriptors / Indicators  Aching    Pain Type  Chronic pain    Pain Onset  More than a month ago    Pain Frequency  Constant    Pain Onset  More than a month ago       Treatment:   Gait training with rollator and min assist with cues for upright posture and safety 30 feet x 2   Transfer training from wc to stand with difficulty with initial static standing posture and needs extra time to gain static standing balance and stability.  Transfer training sit to stand x 5 reps x 2 sets , cues for leaning fwd, hand placement  Therapeutic exercise:  Nu-step x 5 mins UE and LE   LAQ with assist to keep her leg  in correct position. X 15 x 2 sets  Standing with RW and vc for correct posture   Pt educated throughout session about proper posture and technique with exercises. Improved  exercise technique, movement at target joints, use of target muscles after min to mod verbal, visual, tactile cues.                      PT Education - 07/25/18 1401    Education Details  HEP    Person(s) Educated  Patient    Methods  Explanation    Comprehension  Verbalized understanding;Returned demonstration;Need further instruction       PT Short Term Goals - 07/09/18 1423      PT SHORT TERM GOAL #1   Title  Patient will be independent in home exercise program to improve strength/mobility for better functional independence with ADLs.    Time  6    Period  Weeks    Status  New    Target Date  08/20/17      PT SHORT TERM GOAL #2   Title  Patient will perform sit to stand with correct hand placement and without support on the back of the chair with 4WW and SBA.     Time  6    Period  Weeks    Status  New    Target Date  08/20/17      PT SHORT TERM  GOAL #3   Title  Patient will stand for 1 minute with UE support to be able to perform safe transfers.     Baseline  Patient can stand for 10 seconds; Pt unable to stand without assistance; 03/12/18 minAx1 for 30seconds; 04/18/18: able to stand 2x30sec at minGuard assist c rollator     Time  6    Period  Weeks    Status  New    Target Date  08/20/17        PT Long Term Goals - 07/09/18 1421      PT LONG TERM GOAL #1   Title  Patient will be able to ambulate in parallel bars x 10 feet with min assist.    Time  12    Period  Weeks    Status  New    Target Date  10/01/18      PT LONG TERM GOAL #2   Title  Patient will ambulate short distances 25 feet, with RW and mod assist.     Time  12    Period  Weeks    Status  New    Target Date  10/01/18      PT LONG TERM GOAL #3   Title  Patient will be able to ascend 1 step with min assist with LRAD.    Time  12    Period  Weeks    Status  New    Target Date  10/01/18      PT LONG TERM GOAL #4   Title  Patient will be able to perform sit to stand transfer with sBA from [facility]     Time  12    Period  Weeks    Status  New    Target Date  10/01/18            Plan - 07/25/18 1402    Clinical Impression Statement  Patient instructed in transfer training and gait training wiht rollator with VC for sequencing and saftey 50 feet.    Rehab Potential  Fair    PT Frequency  2x / week    PT Duration  12 weeks    PT Treatment/Interventions  Therapeutic exercise;Therapeutic activities;Functional mobility training;Stair training;Balance training;Neuromuscular re-education;Patient/family education;Aquatic  Therapy    PT Next Visit Plan  Supine Stretching and postural strengthening to address hip flexor tightness, hip extensor weakness and trunk extensor weakness; continue to progress time in stance and tolerance/independence with transfers.     PT Home Exercise Plan  administered (LAQ, seated marching, hip abduction with YTB, ankle/toe raises)     Consulted and Agree with Plan of Care  Patient       Patient will benefit from skilled therapeutic intervention in order to improve the following deficits and impairments:  Decreased balance, Decreased endurance, Decreased mobility, Decreased safety awareness, Decreased strength, Decreased activity tolerance  Visit Diagnosis: Other lack of coordination - Plan:   Parkinson's disease ([place]) - Plan:   Muscle weakness (generalized) - Plan:   Difficulty in walking, not elsewhere classified - Plan:     Problem List Patient Active Problem List   Diagnosis Date Noted  . Pain and swelling of right lower extremity 01/02/2017  . Parkinson's disease ([place]) 01/02/2017    Alanson Puls, PT DPT 07/25/2018, 2:26 PM  [place] MAIN [facility] SERVICES [address] [place], [place], [address] Phone: [phone]   Fax:  [phone]  Name: Kelsey Owens MRN: [id number] Date of Birth: [birth year]

## 2018-07-30 ENCOUNTER — Ambulatory Visit: Payer: Medicare Other | Admitting: Physical Therapy

## 2018-07-30 ENCOUNTER — Ambulatory Visit: Payer: Medicare Other | Admitting: Occupational Therapy

## 2018-08-01 ENCOUNTER — Encounter: Payer: Self-pay | Admitting: Occupational Therapy

## 2018-08-01 ENCOUNTER — Ambulatory Visit: Payer: Medicare Other | Admitting: Occupational Therapy

## 2018-08-01 ENCOUNTER — Ambulatory Visit: Payer: Medicare Other

## 2018-08-01 ENCOUNTER — Other Ambulatory Visit: Payer: Self-pay

## 2018-08-01 DIAGNOSIS — G20A1 Parkinson's disease without dyskinesia, without mention of fluctuations: Secondary | ICD-10-CM

## 2018-08-01 DIAGNOSIS — M6281 Muscle weakness (generalized): Secondary | ICD-10-CM

## 2018-08-01 DIAGNOSIS — R278 Other lack of coordination: Secondary | ICD-10-CM

## 2018-08-01 DIAGNOSIS — R262 Difficulty in walking, not elsewhere classified: Secondary | ICD-10-CM

## 2018-08-01 DIAGNOSIS — G20 Parkinson's disease: Secondary | ICD-10-CM

## 2018-08-01 NOTE — Therapy (Signed)
[place] MAIN [facility] SERVICES [address] [place], [place], [address] Phone: [phone]   Fax:  [phone]  Physical Therapy Treatment  Patient Details  Name: Kelsey Owens MRN: [id number] Date of Birth: 1940/02/16 Referring Provider (PT): Benita Stabile   Encounter Date: 08/01/2018  PT End of Session - 08/01/18 1509    Visit Number  7    Number of Visits  26    Date for PT Re-Evaluation  10/01/18    PT Start Time  1402    PT Stop Time  1448    PT Time Calculation (min)  46 min    Equipment Utilized During Treatment  Gait belt    Activity Tolerance  Patient tolerated treatment well    Behavior During Therapy  [facility] for tasks assessed/performed       Past Medical History:  Diagnosis Date  . Arthritis   . Bursitis   . CAD (coronary artery disease)   . GERD (gastroesophageal reflux disease)   . Hyperlipidemia   . Hypertension   . Hypophonia   . Intercostal pain   . Parkinson disease ([place])   . Paroxysmal supraventricular tachycardia [facility])     Past Surgical History:  Procedure Laterality Date  . ABDOMINAL HYSTERECTOMY    . CHOLECYSTECTOMY    . DEEP BRAIN STIMULATOR PLACEMENT    . FLEXIBLE SIGMOIDOSCOPY N/A 12/19/2017   Procedure: FLEXIBLE SIGMOIDOSCOPY;  Surgeon: Manya Silvas, MD;  Location: [facility] ENDOSCOPY;  Service: Endoscopy;  Laterality: N/A;  . HEMORROIDECTOMY      There were no vitals filed for this visit.  Subjective Assessment - 08/01/18 1408    Subjective  Patient reports she had a fall two nights ago off a couch, fell foward and bruised her knees. No pain from fall    Patient is accompained by:  Family member    Pertinent History  Patient has had parkinsons disease for the last 20 years. Her mobility has been getting worse after she had a left radius fx.  She was walking with RW in 10/18.  Patient has worseing loss of balance beginning 10/18.  She had HHPT 2 x week for 2 months beginining after hospitilization in  Dec 2018.     Patient Stated Goals  Patient wants to be able to walk better and not fall as often    Currently in Pain?  Yes    Pain Score  4     Pain Location  Shoulder    Pain Orientation  Right    Pain Descriptors / Indicators  Aching;Shooting    Pain Type  Chronic pain    Pain Onset  More than a month ago    Pain Frequency  Constant    Multiple Pain Sites  Yes    Pain Score  5    Pain Location  Back    Pain Orientation  Right;Lower    Pain Descriptors / Indicators  Aching    Pain Type  Chronic pain    Pain Onset  More than a month ago      Treatment:  Transfer training: sit to stand from plinth table, Patient hands on PT, PT hands on Patient's pelvis/gait belt, cueing as followed: lean forward bring head to PT shoulder, stand up and straighten legs, stand up tall squeezing gluteal, bring head back towards PT shoulder, bend knees and sit. X 5 with decreasing support required from PT  Stand pivot transfers: above cueing with additional mod A for SPT  to L and R to increase stability and maintain safe position/cueing for transfer.   Seated: ankle circles 10x clockwise, 10x counterclockwise, focus on upright posture/ isolating ankle motion   Seated EOB on plinth table tapping balloon inside and outside BOS for 3 minutes without LOB, occasional posterior LOB with patient demonstrating ability to regain COM without LOB.   Supine <>sit Mod A for LE  Supine:  Heel slides 2lb ankle weights 15x each LE  SAQ 15x each LE 2lb with assistance to maintain correct knee positioning and decrease hip flexion. Patient has noted "tying" of hip flexion with knee extension patterning.   SAQ with 3 second hold bilateral LE at same time with bolster under knees #2 lb ankle weight  Supine marching hooklying with 2lb ankle weights 15x each LE  Hamstring stretch with inferior distraction for optimal stretch 2x 60 seconds  LE rotation with rocking x 10  Attempted clamshells: painful to hip so  terminated.                              PT Education - 08/01/18 1508    Education Details  transfers, exercise technique, stability    Person(s) Educated  Patient    Methods  Explanation;Demonstration;Tactile cues;Verbal cues    Comprehension  Verbalized understanding;Returned demonstration;Verbal cues required;Tactile cues required       PT Short Term Goals - 07/09/18 1423      PT SHORT TERM GOAL #1   Title  Patient will be independent in home exercise program to improve strength/mobility for better functional independence with ADLs.    Time  6    Period  Weeks    Status  New    Target Date  08/20/17      PT SHORT TERM GOAL #2   Title  Patient will perform sit to stand with correct hand placement and without support on the back of the chair with 4WW and SBA.     Time  6    Period  Weeks    Status  New    Target Date  08/20/17      PT SHORT TERM GOAL #3   Title  Patient will stand for 1 minute with UE support to be able to perform safe transfers.     Baseline  Patient can stand for 10 seconds; Pt unable to stand without assistance; 03/12/18 minAx1 for 30seconds; 04/18/18: able to stand 2x30sec at minGuard assist c rollator     Time  6    Period  Weeks    Status  New    Target Date  08/20/17        PT Long Term Goals - 07/09/18 1421      PT LONG TERM GOAL #1   Title  Patient will be able to ambulate in parallel bars x 10 feet with min assist.    Time  12    Period  Weeks    Status  New    Target Date  10/01/18      PT LONG TERM GOAL #2   Title  Patient will ambulate short distances 25 feet, with RW and mod assist.     Time  12    Period  Weeks    Status  New    Target Date  10/01/18      PT LONG TERM GOAL #3   Title  Patient will be able to ascend 1 step with min  assist with LRAD.    Time  12    Period  Weeks    Status  New    Target Date  10/01/18      PT LONG TERM GOAL #4   Title  Patient will be able to perform sit to stand  transfer with sBA from [facility]     Time  12    Period  Weeks    Status  New    Target Date  10/01/18            Plan - 08/01/18 1510    Clinical Impression Statement  Patient presents to physical therapy with good motivation. Patient has "tethering" of hip flexion with knee extension resulting in occasional trunk posterior LOB. Patient demonstrated improved transfer technique with decreasing assistance with repetition and max verbal cueing for step by step sequencing. She will continue to benefit from skilled PT to improve strength and mobility.    Rehab Potential  Fair    PT Frequency  2x / week    PT Duration  12 weeks    PT Treatment/Interventions  Therapeutic exercise;Therapeutic activities;Functional mobility training;Stair training;Balance training;Neuromuscular re-education;Patient/family education;Aquatic Therapy    PT Next Visit Plan  Supine Stretching and postural strengthening to address hip flexor tightness, hip extensor weakness and trunk extensor weakness; continue to progress time in stance and tolerance/independence with transfers.     PT Home Exercise Plan  administered (LAQ, seated marching, hip abduction with YTB, ankle/toe raises)    Consulted and Agree with Plan of Care  Patient       Patient will benefit from skilled therapeutic intervention in order to improve the following deficits and impairments:  Decreased balance, Decreased endurance, Decreased mobility, Decreased safety awareness, Decreased strength, Decreased activity tolerance  Visit Diagnosis: 1. Muscle weakness (generalized)   2. Other lack of coordination   3. Parkinson's disease ([place])   4. Difficulty in walking, not elsewhere classified        Problem List Patient Active Problem List   Diagnosis Date Noted  . Pain and swelling of right lower extremity 01/02/2017  . Parkinson's disease ([place]) 01/02/2017   Janna Arch, PT, DPT   08/01/2018, 3:11 PM  [place] MAIN [facility] SERVICES [address] [place], [place], [address] Phone: [phone]   Fax:  [phone]  Name: Kelsey Owens MRN: [id number] Date of Birth: [birth year]

## 2018-08-01 NOTE — Therapy (Signed)
[place] MAIN [facility] SERVICES [address] [place], [place], [address] Phone: [phone]   Fax:  [phone]  Occupational Therapy Treatment  Patient Details  Name: Cashae Weich MRN: [id number] Date of Birth: [birth year] No data recorded  Encounter Date: 08/01/2018  OT End of Session - 08/03/18 1200    Visit Number  43    Number of Visits  79    Date for OT Re-Evaluation  10/01/18    Authorization Time Period  Progress report period starting 07/16/2018    OT Start Time  1307    OT Stop Time  1349    OT Time Calculation (min)  42 min    Activity Tolerance  Patient tolerated treatment well    Behavior During Therapy  [facility] for tasks assessed/performed       Past Medical History:  Diagnosis Date  . Arthritis   . Bursitis   . CAD (coronary artery disease)   . GERD (gastroesophageal reflux disease)   . Hyperlipidemia   . Hypertension   . Hypophonia   . Intercostal pain   . Parkinson disease ([place])   . Paroxysmal supraventricular tachycardia [facility])     Past Surgical History:  Procedure Laterality Date  . ABDOMINAL HYSTERECTOMY    . CHOLECYSTECTOMY    . DEEP BRAIN STIMULATOR PLACEMENT    . FLEXIBLE SIGMOIDOSCOPY N/A 12/19/2017   Procedure: FLEXIBLE SIGMOIDOSCOPY;  Surgeon: Manya Silvas, MD;  Location: [facility] ENDOSCOPY;  Service: Endoscopy;  Laterality: N/A;  . HEMORROIDECTOMY      There were no vitals filed for this visit.  Subjective Assessment - 08/03/18 1200    Subjective   Patient reports her right shoulder pain is 4/10 this date, she also has some bilateral knee pain 4/10 and "keeps me up at night"    Pertinent History  Pt. is a 78 y.o. female with a history of Parkinsons Disease. Pt. has a Deep Brain Stimulator, and has recently had it adjusted. Pt. has had multiple falls, and has a history of multiple wrist fractures.     Patient Stated Goals  To be as independent as possible.     Currently in Pain?  Yes    Pain Score   4     Pain Location  Shoulder    Pain Orientation  Right    Pain Descriptors / Indicators  Aching;Shooting    Pain Type  Chronic pain    Pain Onset  More than a month ago    Pain Frequency  Intermittent        Neuromuscular reeducation: Patient seen this date for fine motor coordination skills with use of Snap beads for bilateral hand strength and coordination to place together in a string and also remove.  Increased difficulty with placing together than removing.  Placing Round pegs into board with board placed on wedge to slightly increase reach.  Cues for prehension patterns and manipulation skills.  Patient seen for Handwriting for recipe, decreased legibility with first 3 items on list but with cues she is able to improve with forming larger letters, printing and using lined paper.  Issued recipe in typed format and asked patient to work towards handwriting at home.    Response to tx: Patient continues to work towards fine motor coordination skills as well as Engineer, production.  She has improved legibility when following guidelines and therapist cues.  Will see how homework goes this week, therapist wrote out directions for writing for recipe.  Continue  to work towards goals in plan of care to increase independence in daily tasks.                     OT Education - 08/03/18 1200    Education Details  handwriting for recipes    Person(s) Educated  Patient    Comprehension  Verbalized understanding;Returned demonstration          OT Long Term Goals - 07/18/18 1316      OT LONG TERM GOAL #1   Title  Pt. will improve BUE strength by 2 muscle grades to improve ADL, and IADL functioning.    Baseline  01/30/2018: Pt. conitnues to present with weakness in BUEs    Time  12    Period  Weeks    Status  On-going      OT LONG TERM GOAL #2   Title  Pt. will increase Left grip strength by 5# to be able to open jars, and containers.    Baseline  01/30/2018: Pt. is now  able toopen jars, and containers.    Time  12    Period  Weeks    Status  Partially Met      OT LONG TERM GOAL #3   Title  Pt. will improve left hand [facility] skills  to be able to button clothing efficiently    Baseline  01/30/2018: Pt. has difficulty manipulating buttons efficiently.    Time  12    Period  Weeks    Status  On-going      OT LONG TERM GOAL #4   Title  Pt. will independently write one sentence efficiently with 75% legibility.    Baseline  01/30/2018: Pt. conitnues to present with decreased writing legibility.    Time  12    Period  Weeks    Status  On-going      OT LONG TERM GOAL #5   Title  Pt. independently demonstrate work simplification strategies, and compensatory startegies for safe IADL tasks/home management tasks.         OT LONG TERM GOAL #6   Title  Pt. will independently demonstrate work simplification strategies, and compensatory startegies within the kitchen from w/c height.    Baseline  Continue    Time  12    Period  Weeks    Status  On-going      OT LONG TERM GOAL #7   Title  Pt. will be able to complete toilet hygiene care with supervision.    Baseline  Pt. is unable to perform the task.    Time  12    Period  Weeks    Status  On-going            Plan - 08/03/18 1201    Clinical Impression Statement  Patient continues to work towards fine motor coordination skills as well as Engineer, production.  She has improved legibility when following guidelines and therapist cues.  Will see how homework goes this week, therapist wrote out directions for writing for recipe.  Continue to work towards goals in plan of care to increase independence in daily tasks.    Occupational Profile and client history currently impacting functional performance  Pt. resides with her husband who is a Theme park manager. Pt. has grown children.     Occupational performance deficits (Please refer to evaluation for details):  ADL's;IADL's    Rehab Potential  Good    Clinical Decision  Making  Several treatment options, min-mod task  modification necessary    OT Frequency  2x / week    OT Duration  12 weeks    OT Treatment/Interventions  Self-care/ADL training;Neuromuscular education;Therapeutic exercise;DME and/or AE instruction;Visual/perceptual remediation/compensation;Patient/family education;Therapeutic activities    Consulted and Agree with Plan of Care  Patient       Patient will benefit from skilled therapeutic intervention in order to improve the following deficits and impairments:           Visit Diagnosis: 1. Other lack of coordination   2. Muscle weakness (generalized)   3. Parkinson's disease [facility])       Problem List Patient Active Problem List   Diagnosis Date Noted  . Pain and swelling of right lower extremity 01/02/2017  . Parkinson's disease ([place]) 01/02/2017   Angelyn Osterberg T Tomasita Morrow, OTR/L, CLT  Laurina Fischl 08/03/2018, 12:08 PM  [place] MAIN [facility] SERVICES [address] [place], [place], [address] Phone: [phone]   Fax:  [phone]  Name: Koral Thaden MRN: [id number] Date of Birth: Jul 25, 1940

## 2018-08-06 ENCOUNTER — Ambulatory Visit: Payer: Medicare Other

## 2018-08-06 ENCOUNTER — Ambulatory Visit: Payer: Medicare Other | Admitting: Occupational Therapy

## 2018-08-08 ENCOUNTER — Ambulatory Visit: Payer: Medicare Other | Admitting: Occupational Therapy

## 2018-08-08 ENCOUNTER — Ambulatory Visit: Payer: Medicare Other

## 2018-08-13 ENCOUNTER — Ambulatory Visit: Payer: Medicare Other | Admitting: Physical Therapy

## 2018-08-13 ENCOUNTER — Ambulatory Visit: Payer: Medicare Other | Admitting: Occupational Therapy

## 2018-08-15 ENCOUNTER — Ambulatory Visit: Payer: Medicare Other | Admitting: Physical Therapy

## 2018-08-15 ENCOUNTER — Ambulatory Visit: Payer: Medicare Other | Admitting: Occupational Therapy

## 2018-08-20 ENCOUNTER — Ambulatory Visit: Payer: Medicare Other | Admitting: Physical Therapy

## 2018-08-20 ENCOUNTER — Ambulatory Visit: Payer: Medicare Other | Admitting: Occupational Therapy

## 2018-08-22 ENCOUNTER — Ambulatory Visit: Payer: Medicare Other | Admitting: Physical Therapy

## 2018-08-22 ENCOUNTER — Ambulatory Visit: Payer: Medicare Other | Admitting: Occupational Therapy

## 2018-08-27 ENCOUNTER — Ambulatory Visit: Payer: Medicare Other | Admitting: Occupational Therapy

## 2018-08-27 ENCOUNTER — Ambulatory Visit: Payer: Medicare Other

## 2018-08-29 ENCOUNTER — Ambulatory Visit: Payer: Medicare Other | Admitting: Occupational Therapy

## 2018-08-29 ENCOUNTER — Ambulatory Visit: Payer: Medicare Other | Admitting: Physical Therapy

## 2018-09-03 ENCOUNTER — Ambulatory Visit: Payer: Medicare Other | Admitting: Occupational Therapy

## 2018-09-03 ENCOUNTER — Ambulatory Visit: Payer: Medicare Other

## 2018-09-05 ENCOUNTER — Encounter: Payer: Medicare Other | Admitting: Occupational Therapy

## 2018-09-05 ENCOUNTER — Ambulatory Visit: Payer: Medicare Other

## 2018-09-10 ENCOUNTER — Ambulatory Visit: Payer: Medicare Other | Admitting: Physical Therapy

## 2018-09-10 ENCOUNTER — Encounter: Payer: Medicare Other | Admitting: Occupational Therapy

## 2018-09-12 ENCOUNTER — Ambulatory Visit: Payer: Medicare Other | Admitting: Physical Therapy

## 2018-09-12 ENCOUNTER — Encounter: Payer: Medicare Other | Admitting: Occupational Therapy

## 2018-09-16 ENCOUNTER — Ambulatory Visit: Payer: Medicare Other | Admitting: Physical Therapy

## 2018-09-16 ENCOUNTER — Encounter: Payer: Medicare Other | Admitting: Occupational Therapy

## 2018-09-18 ENCOUNTER — Encounter: Payer: Medicare Other | Admitting: Occupational Therapy

## 2018-09-18 ENCOUNTER — Ambulatory Visit: Payer: Medicare Other | Admitting: Physical Therapy

## 2018-09-24 ENCOUNTER — Encounter: Payer: Medicare Other | Admitting: Occupational Therapy

## 2018-09-24 ENCOUNTER — Ambulatory Visit: Payer: Medicare Other

## 2018-09-26 ENCOUNTER — Ambulatory Visit: Payer: Medicare Other

## 2018-09-30 ENCOUNTER — Ambulatory Visit: Payer: Medicare Other

## 2018-09-30 ENCOUNTER — Encounter: Payer: Medicare Other | Admitting: Occupational Therapy

## 2018-10-01 DIAGNOSIS — S46019A Strain of muscle(s) and tendon(s) of the rotator cuff of unspecified shoulder, initial encounter: Secondary | ICD-10-CM | POA: Insufficient documentation

## 2018-10-01 DIAGNOSIS — M75100 Unspecified rotator cuff tear or rupture of unspecified shoulder, not specified as traumatic: Secondary | ICD-10-CM | POA: Insufficient documentation

## 2018-10-01 DIAGNOSIS — M12819 Other specific arthropathies, not elsewhere classified, unspecified shoulder: Secondary | ICD-10-CM | POA: Insufficient documentation

## 2018-10-02 ENCOUNTER — Encounter: Payer: Medicare Other | Admitting: Occupational Therapy

## 2018-10-08 ENCOUNTER — Encounter: Payer: Medicare Other | Admitting: Occupational Therapy

## 2018-10-08 ENCOUNTER — Ambulatory Visit: Payer: Medicare Other

## 2018-10-10 ENCOUNTER — Ambulatory Visit: Payer: Medicare Other

## 2018-10-10 ENCOUNTER — Encounter: Payer: Medicare Other | Admitting: Occupational Therapy

## 2018-10-29 ENCOUNTER — Ambulatory Visit: Payer: Medicare Other | Admitting: Physical Therapy

## 2018-10-31 ENCOUNTER — Encounter: Payer: Medicare Other | Admitting: Occupational Therapy

## 2018-11-05 ENCOUNTER — Encounter: Payer: Medicare Other | Admitting: Occupational Therapy

## 2018-11-05 ENCOUNTER — Ambulatory Visit: Payer: Medicare Other

## 2018-11-05 ENCOUNTER — Telehealth: Payer: Self-pay | Admitting: Primary Care

## 2018-11-05 NOTE — Telephone Encounter (Signed)
Spoke with patient's daughter Sebastian Ache regarding Palliative services and she was in agreement with this.  I have scheduled an In-Person Palliative Consult for 11/08/18 @ 10:30 AM.

## 2018-11-07 ENCOUNTER — Encounter: Payer: Medicare Other | Admitting: Occupational Therapy

## 2018-11-08 ENCOUNTER — Other Ambulatory Visit: Payer: Medicare Other | Admitting: Primary Care

## 2018-11-08 ENCOUNTER — Other Ambulatory Visit: Payer: Self-pay

## 2018-11-08 DIAGNOSIS — Z515 Encounter for palliative care: Secondary | ICD-10-CM

## 2018-11-08 NOTE — Progress Notes (Signed)
[facility] [facility] Palliative Care Consult Note Telephone: [phone]  Fax: [phone]  TELEHEALTH VISIT STATEMENT Due to the COVID-19 crisis, this visit was done via telemedicine from my office. It was initiated and consented to by this patient and/or family.  PATIENT NAME: Kelsey Owens [address] [place] [address] [phone] (home)  DOB: [birth year] MRN: [id number]  PRIMARY CARE PROVIDER:   Albina Billet, MD, [address]   [place] [address] [phone]  REFERRING PROVIDER:  Albina Billet, MD [address]   [place],  [place] [address] [phone]  RESPONSIBLE PARTY:   Extended Emergency Contact Information Primary Emergency Contact: Aleisha, Paone Address: [address]          [place], [place] [address] Johnnette Litter of [place] Phone: [phone] Mobile Phone: [phone] Relation: Spouse Secondary Emergency Contact: Barbour,Carie  [employer] of [employer]: [phone] Relation: Daughter   ASSESSMENT AND RECOMMENDATIONS:   1. Advance Care Planning/Goals of Care: Goals include to maximize quality of life and symptom management. I discussed the MOST form with them and described it as a form that would communicate their medical wishes. They said they would discuss it in the as a family and make their choices. I left forms for them to complete and we will discuss on next visit.  2. Symptom Management:   Mobility: We discussed improving mobility by treating pain proactively with acetaminophen and also complying with a home exercise program. She is currently having physical therapy from home health to improve her gait and safety. I reiterated how important exercise was to prevent falls by enhancing mobility. Patient getting up and falling at night. Cannot walk during the day alone but up at night by herself.  Discussed floor bed, geri hip protectors, night time sitters to monitor falls.  Falls/nocturnal  ambulation: Family states patient is able to ambulate independently at night when she gets up but during the day she is not able to ambulate without assistance. We discussed some ways to increase safety at night such as using floor mattress  and having a nighttime sitter. They also have a security type camera that will alert  If there is any motion and then their daughters, who take turns in the evening and spending the night, are able to come and assist patient. We also discussed a bedside commode that would allow her to urinate and quickly get back to bed.  Medications: We reviewed medicines and the daughter will call PCP to ask about possibility prescribing of Some. We discussed her pain management she described a pain in her right Hip and shoulder which might be arthritic. I recommend acetaminophen ATC (acetaminophen arthritis).   3. Family /Caregiver/Community Supports:  Patient lives in own home with elderly husband who has presumed pancreatic cancer. Their daughters are currently helping during the day but are looking for hired help. I met with patient patients husband and daughter patient's husband was also assessed. Daughter states main issues are up at night falling frequently even know the bathroom to call for help.   4. Cognitive / Functional decline: Confusion from PD, states unable to ambulate alone in day time but gets up and walks to bathroom and to check on husband at night.Dependent in most adls and all iadls.  5. Follow up Palliative Care Visit: Palliative care will continue to follow for goals of care clarification and symptom management. Return 4 weeks or prn.  I spent 60 minutes  providing this consultation,  from 1030 to 1130  . More than 50% of the time in this consultation was spent coordinating communication.   HISTORY OF PRESENT ILLNESS:  Kelsey Owens is a 78 y.o. year old female with multiple medical problems including PD, frequent falls, chronic pain. Palliative Care  was asked to follow this patient by consultation request of Albina Billet, MD to help address advance care planning and goals of care. This is the initial visit.  CODE STATUS:  TBD  PPS: 40% HOSPICE ELIGIBILITY/DIAGNOSIS: TBD  PAST MEDICAL HISTORY:  Past Medical History:  Diagnosis Date  . Arthritis   . Bursitis   . CAD (coronary artery disease)   . GERD (gastroesophageal reflux disease)   . Hyperlipidemia   . Hypertension   . Hypophonia   . Intercostal pain   . Parkinson disease ([place])   . Paroxysmal supraventricular tachycardia ([facility])     SOCIAL HX:  Social History   Tobacco Use  . Smoking status: Never Smoker  . Smokeless tobacco: Never Used  Substance Use Topics  . Alcohol use: Yes    Comment: occasional    ALLERGIES:  Allergies  Allergen Reactions  . Carafate [Sucralfate]   . Levaquin [Levofloxacin In D5w]   . Penicillins   . Septra [Sulfamethoxazole-Trimethoprim]   . Thorazine [Chlorpromazine] Other (See Comments)  . Ceftin [Cefuroxime] Rash     PERTINENT MEDICATIONS:  Outpatient Encounter Medications as of 11/08/2018  Medication Sig  . Biotin 2.5 MG TABS Take by mouth.  . carbidopa-levodopa (SINEMET IR) 25-100 MG tablet 0.5 tablets 6 (six) times daily.  . cycloSPORINE (RESTASIS) 0.05 % ophthalmic emulsion 1 drop 2 (two) times daily.  . medroxyPROGESTERone (PROVERA) 2.5 MG tablet medroxyprogesterone acetate 2.5 mgtabs  . meloxicam (MOBIC) 15 MG tablet Take by mouth daily.   . Mesalamine (DELZICOL) 400 MG CPDR DR capsule 2 (two) times daily.  . mirabegron ER (MYRBETRIQ) 25 MG TB24 tablet myrbetriq 25 mg tb24  . nitroGLYCERIN (NITROSTAT) 0.3 MG SL tablet Place 0.3 mg under the tongue every 5 (five) minutes as needed for chest pain.  Marland Kitchen rOPINIRole (REQUIP) 1 MG tablet Take by mouth.  . sertraline (ZOLOFT) 25 MG tablet sertraline 25 mg tablet  . Vitamin D, Ergocalciferol, (DRISDOL) 50000 units CAPS capsule vitamin d 50000 unit caps  . almotriptan (AXERT) 12.5 MG  tablet Take by mouth.  . baclofen (LIORESAL) 10 MG tablet baclofen 10 mg tablet  . clonazePAM (KLONOPIN) 0.5 MG tablet Take by mouth.  Marland Kitchen HYDROcodone-acetaminophen (NORCO) 5-325 MG per tablet Take 1 tablet by mouth every 6 (six) hours as needed for moderate pain or severe pain. (Patient not taking: Reported on 01/02/2017)  . Lifitegrast (XIIDRA) 5 % SOLN xiidra 5 % soln  . ondansetron (ZOFRAN ODT) 4 MG disintegrating tablet Take 1 tablet (4 mg total) by mouth every 8 (eight) hours as needed for nausea or vomiting. (Patient not taking: Reported on 11/08/2018)  . predniSONE (DELTASONE) 10 MG tablet Take 10 mg by mouth daily with breakfast.  . ranitidine (ZANTAC) 300 MG tablet ranitidine hcl 300 mg tabs  . selegiline (ELDEPRYL) 5 MG capsule selegiline 5 mg capsule  . simethicone (GAS-X) 80 MG chewable tablet Chew 1 tablet (80 mg total) by mouth every 6 (six) hours as needed for flatulence.  . sulfamethoxazole-trimethoprim (BACTRIM DS,SEPTRA DS) 800-160 MG tablet Take 1 tablet by mouth 2 (two) times daily. (Patient not taking: Reported on 11/08/2018)  . traMADol (ULTRAM) 50 MG tablet tramadol 50 mg  tablet  . trospium (SANCTURA) 20 MG tablet Take 20 mg by mouth 2 (two) times daily.   No facility-administered encounter medications on file as of 11/08/2018.     PHYSICAL EXAM / ROS:   Current and past weights: 125 lb. General: NAD, frail appearing, thin Cardiovascular: no chest pain reported, no edema,  Pulmonary: no cough, no increased SOB, denies DOE Abdomen: appetite good, endorses constipation, continent of bowel GU: denies dysuria, continent of urine, MSK:  no joint deformities, ambulatory with rollator with help. Skin: no rashes or wounds reported Neurological: Weakness, parkinsons deficits, up at hS, returns to sleep. Does not sleep much during the day.  Cyndia Skeeters DNP AGPCNP-BC  COVID-19 PATIENT SCREENING TOOL  Person answering questions: ______Ida_____________ _____   1.  Is the  patient or any family member in the home showing any signs or symptoms regarding respiratory infection?               Person with Symptom- _______NA____________________  a. Fever                                                                          Yes___ No___          ___________________  b. Shortness of breath                                                    Yes___ No___          ___________________ c. Cough/congestion                                       Yes___  No___         ___________________ d. Body aches/pains                                                         Yes___ No___        ____________________ e. Gastrointestinal symptoms (diarrhea, nausea)           Yes___ No___        ____________________  2. Within the past 14 days, has anyone living in the home had any contact with someone with or under investigation for COVID-19?    Yes___ No__x   Person __________________

## 2018-11-12 ENCOUNTER — Ambulatory Visit: Payer: Medicare Other | Admitting: Physical Therapy

## 2018-11-12 ENCOUNTER — Encounter: Payer: Medicare Other | Admitting: Occupational Therapy

## 2018-11-14 ENCOUNTER — Encounter: Payer: Medicare Other | Admitting: Occupational Therapy

## 2018-11-14 ENCOUNTER — Ambulatory Visit: Payer: Medicare Other

## 2018-11-19 ENCOUNTER — Encounter: Payer: Medicare Other | Admitting: Occupational Therapy

## 2018-11-21 ENCOUNTER — Ambulatory Visit: Payer: Medicare Other | Admitting: Physical Therapy

## 2018-11-21 IMAGING — CT CT MAXILLOFACIAL W/O CM
4 of 8 series · 16 of 47 positions shown, 18 images · non-contrast
Comparison: 10/16/2017 CT head and cervical spine.

CLINICAL DATA: 77 y/o F; fall with laceration to the right eye.
Recent fall yesterday.

EXAM:
CT HEAD WITHOUT CONTRAST
CT MAXILLOFACIAL WITHOUT CONTRAST
TECHNIQUE: Multidetector CT imaging of the head and maxillofacial structures
were performed using the standard protocol without intravenous
contrast. Multiplanar CT image reconstructions of the maxillofacial
structures were also generated.

[Series 2: head (person_name) (person_name) · axial · 0.43mm/px · z∈[-113,-13]mm · 6 of 29 slices shown, 8 images]
[im 5/29  brain]
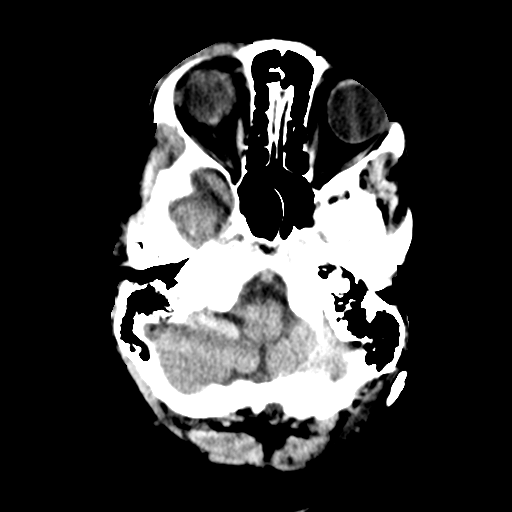
[im 5/29  bone]
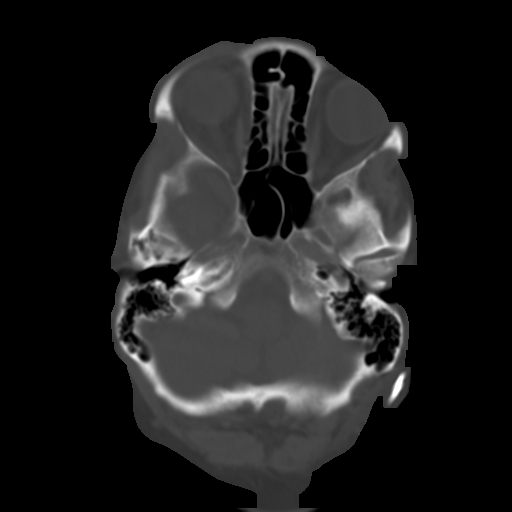
[im 9/29  bone]
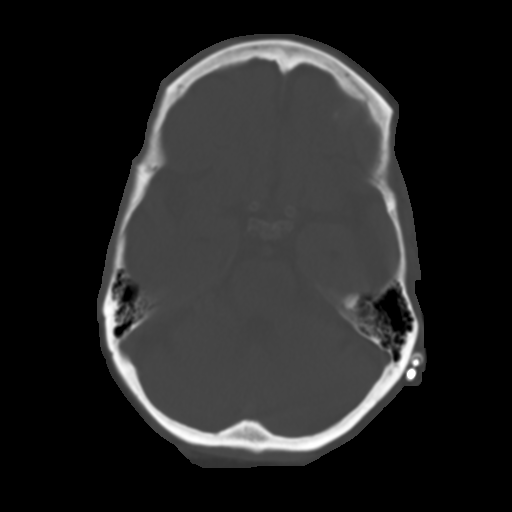
[im 13/29  bone]
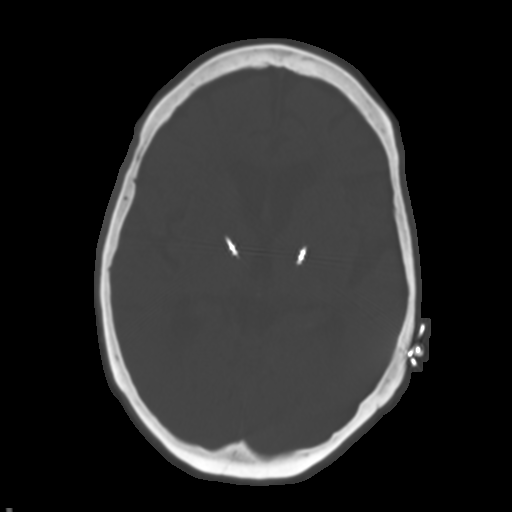
[im 17/29  bone]
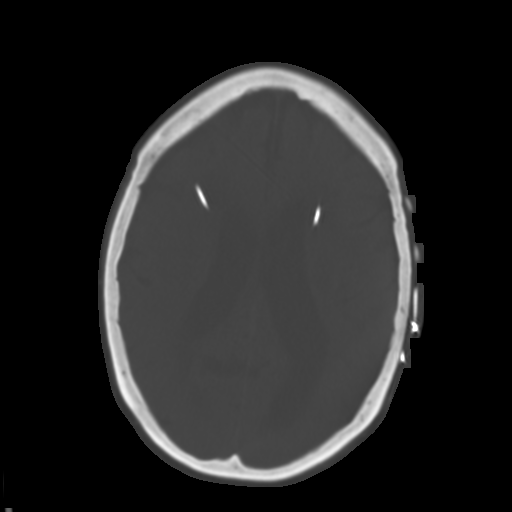
[im 21/29  brain]
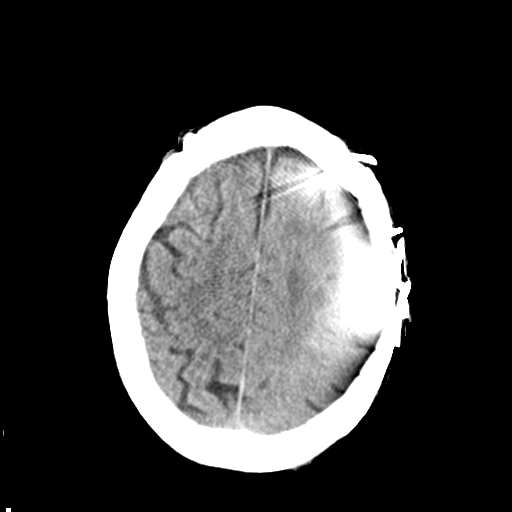
[im 21/29  bone]
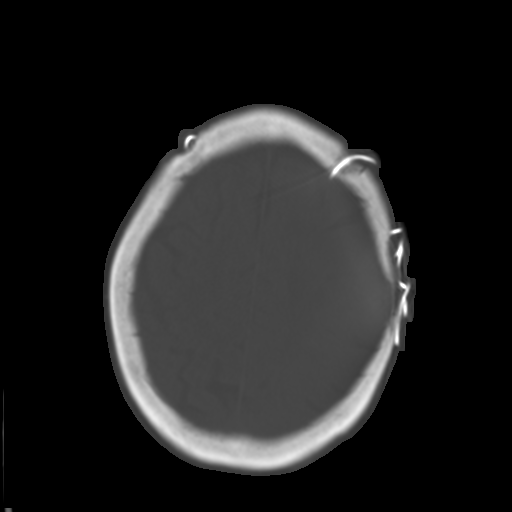
[im 25/29  bone]
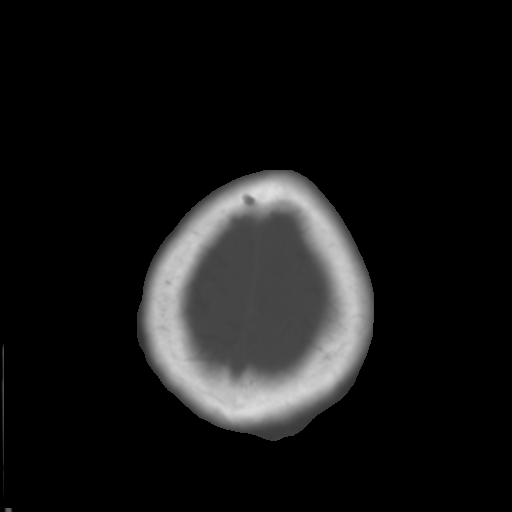

[Series 7: max soft · axial · 0.33mm/px · z∈[-234,-118]mm · 7 of 82 slices shown]
[im 8/82  brain]
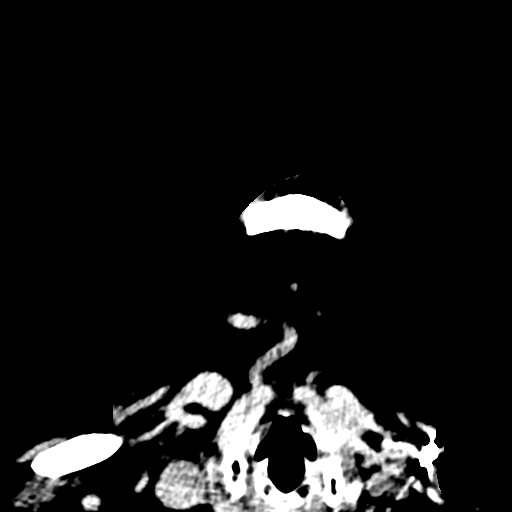
[im 16/82  brain]
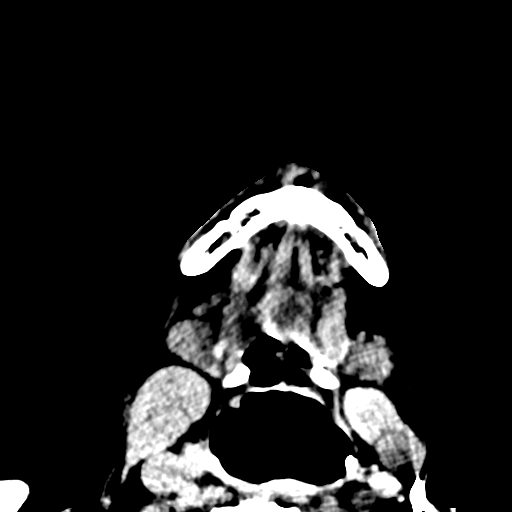
[im 28/82  brain]
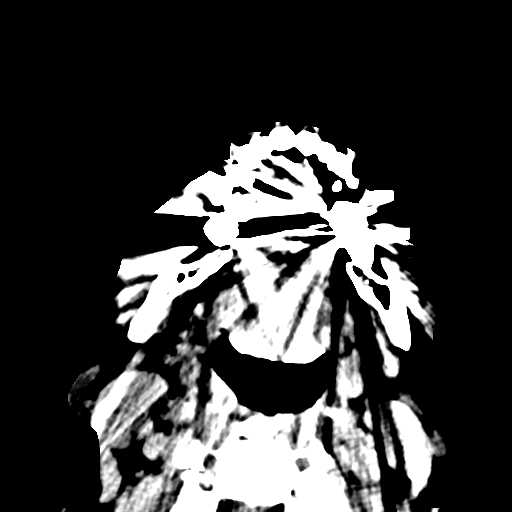
[im 35/82  brain]
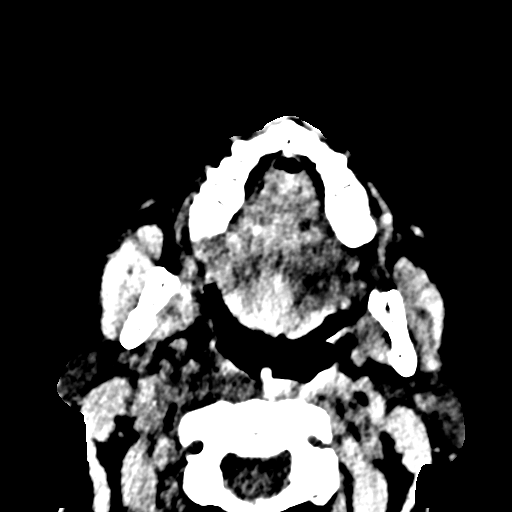
[im 47/82  brain]
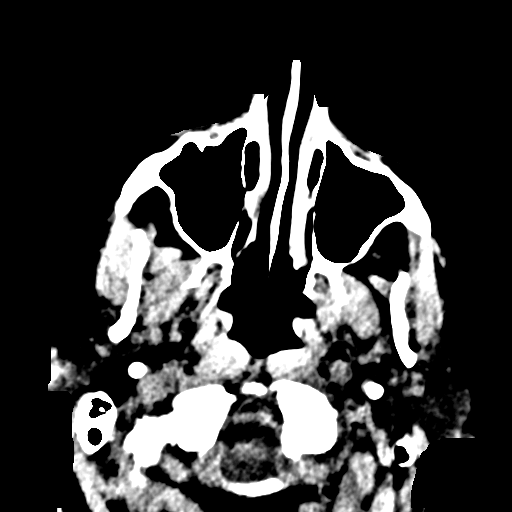
[im 55/82  brain]
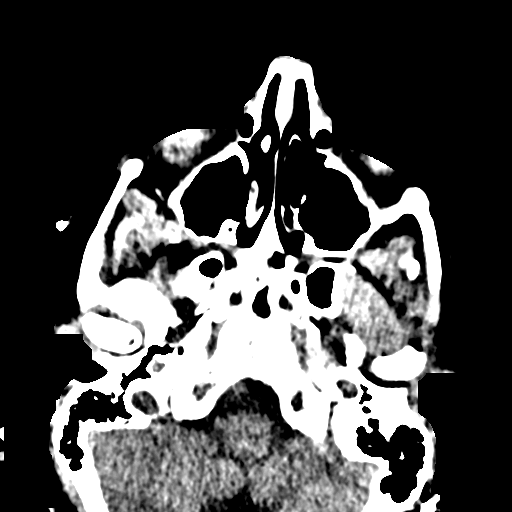
[im 66/82  brain]
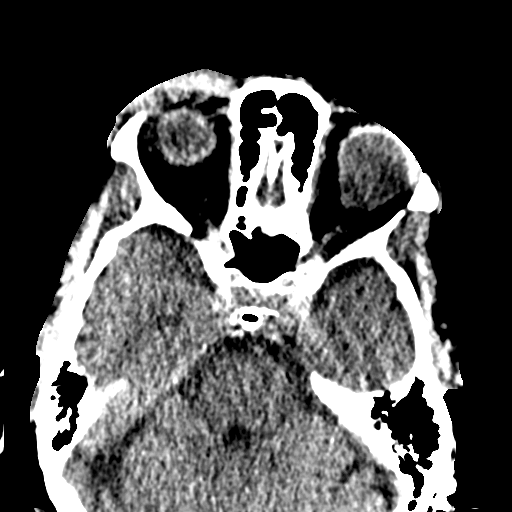

[Series 12: coronal bone · coronal · 0.36mm/px · 2 of 90 slices shown]
[im 30/90  bone]
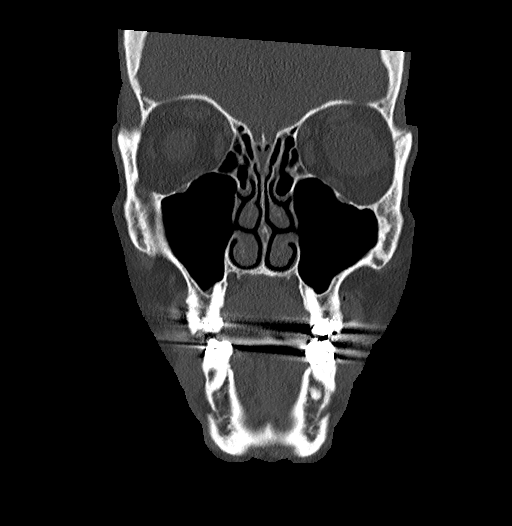
[im 60/90  bone]
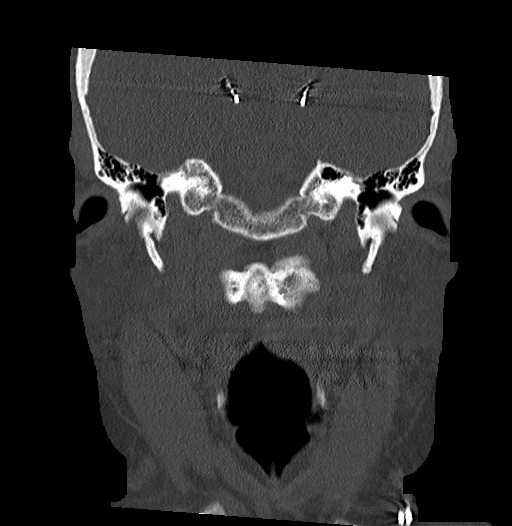

[Series 13: sagittal bone · sagittal · 0.38mm/px · 1 of 76 slices shown]
[im 38/76  bone]
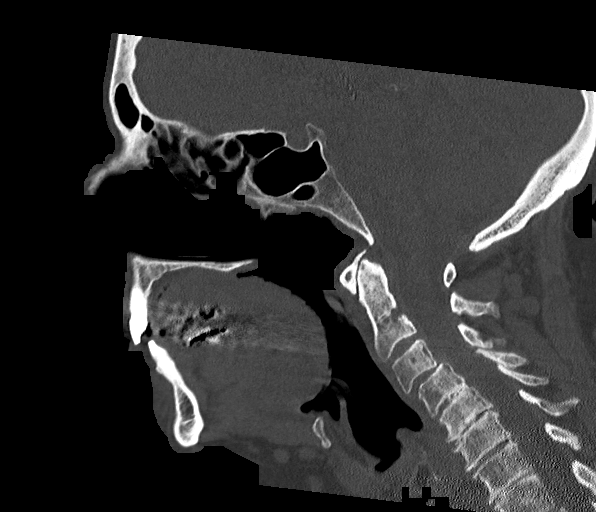

[16 of 47 positions shown; findings below may reference images not displayed]

FINDINGS: CT HEAD FINDINGS

Brain: No evidence of acute infarction, hemorrhage, hydrocephalus,
extra-axial collection or mass lesion/mass effect. Bilateral frontal
approach deep brain stimulators are stable in position, no lead
discontinuity identified. Streak artifact from the leads partially
obscures the brain parenchyma. Stable chronic microvascular ischemic
changes and volume loss of the brain.

Vascular: Calcific atherosclerosis of the carotid siphons. No
hyperdense vessel.

Skull: New complete dispersion of left parietal scalp hematoma. New
right periorbital and frontal scalp small contusion with laceration.
No calvarial fracture.

Other: None.

CT MAXILLOFACIAL FINDINGS

Osseous: No fracture or mandibular dislocation. No destructive
process.

Orbits: No traumatic or inflammatory finding of the orbital
compartments. Right intra-ocular lens replacement. Left globe
staphyloma. Cervical spondylosis with moderate discogenic
degenerative changes at the C4 through C7 levels and grade 1 C3-4
anterolisthesis.

Sinuses: Clear.

Soft tissues: Right periorbital and frontal scalp small contusion
with laceration.
IMPRESSION: 1. Right periorbital and frontal scalp small contusion with
laceration. Near complete dispersion of left parietal scalp
hematoma.
2. No acute intracranial abnormality or calvarial fracture.
3. No acute facial fracture or mandibular dislocation. No traumatic
finding of the right orbital compartment.
4. Stable chronic microvascular ischemic changes and volume loss of
the brain.
5. Left globe staphyloma.

By: Hguhrgh Jirdeh M.D.

## 2018-11-25 ENCOUNTER — Ambulatory Visit: Payer: Medicare Other | Admitting: Physical Therapy

## 2018-11-27 ENCOUNTER — Encounter: Payer: Medicare Other | Admitting: Occupational Therapy

## 2018-11-27 ENCOUNTER — Ambulatory Visit: Payer: Medicare Other | Admitting: Physical Therapy

## 2018-12-02 ENCOUNTER — Other Ambulatory Visit: Payer: Self-pay

## 2018-12-02 ENCOUNTER — Other Ambulatory Visit: Payer: Medicare Other | Admitting: Primary Care

## 2018-12-02 DIAGNOSIS — Z515 Encounter for palliative care: Secondary | ICD-10-CM

## 2018-12-02 NOTE — Progress Notes (Signed)
[facility] Consult Note Telephone: [phone]  Fax: [phone]  PATIENT NAME: Kelsey Owens [address] [place] [address] [phone] (home)  DOB: [birth year] MRN: [id number]  PRIMARY CARE PROVIDER:   Albina Billet, MD, [address]   [place] [address] [phone]  REFERRING PROVIDER:  Albina Billet, MD [address]   [place],  [place] [address] [phone]  RESPONSIBLE PARTY:   Extended Emergency Contact Information Primary Emergency Contact: Dempsey, Knotek Address: [place]          [place], [place] [address] Johnnette Litter of [place] Phone: [phone] Mobile Phone: [phone] Relation: Spouse Secondary Emergency Contact: Barbour,Carie  [employer]: [phone] Relation: Daughter   ASSESSMENT AND RECOMMENDATIONS:   1. Advance Care Planning/Goals of Care: Goals include to maximize quality of life and symptom management. Most form completed today, patient chose resuscitation, limited scope of care, IV and antibiotic use, no feeding tube. We discussed at length these questions and scenarios for advance care plans to be used.  2. Symptom Management:   Pain: States it is improved with ATC tylenol. She states she has been more comfortable.   Caregiving: Adult children are caring for both parents, pt husband is quite ill. Newly hired live in caregiver, Patty Sermons, is a big success and help for the family. Daughters continue to provide care for Tay's days off.  We discussed her husband's poor prognosis and ongoing care needs. Husband had been providing care prior to his illness.  Frequent Falls: Patient continues to get up alone and falls every several days. We again discussed the risks of falling and risk of fracture. She now has a night time care giver but she still can get up sometimes without detection. Has walkers for stability but does not use it often.  3. Family  /Caregiver/Community Supports:  Lives with husband and caregivers in own home. Son and 2 daughters in the area and very involved with parents' care. Patient's husband was a Theme park manager and they have church friends. Husband is very ill and likely going on to hospice services this week.   4. Cognitive / Functional decline:  Alert and oriented x 1-2, able to discuss concepts but has some delays in cognition. Unable to ambulate safely (I).  5. Follow up Palliative Care Visit: Palliative care will continue to follow for goals of care clarification and symptom management. Return 3-4 weeks or prn.  I spent 60 minutes providing this consultation,  from 1600 to 1700. More than 50% of the time in this consultation was spent coordinating communication.   HISTORY OF PRESENT ILLNESS:  Kelsey Owens is a 78 y.o. year old female with multiple medical problems including PD, frequent falls, chronic pain. Palliative Care was asked to follow this patient by consultation request of Albina Billet, MD to help address advance care planning and goals of care. This is a follow up visit.  CODE STATUS:  Attempt resuscitations, comfort measures, use of antibiotics and IV fluids, no feeding tube.  PPS: 30% HOSPICE ELIGIBILITY/DIAGNOSIS: TBD  PAST MEDICAL HISTORY:  Past Medical History:  Diagnosis Date   Arthritis    Bursitis    CAD (coronary artery disease)    GERD (gastroesophageal reflux disease)    Hyperlipidemia    Hypertension    Hypophonia    Intercostal pain    Parkinson disease (HCC)    Paroxysmal supraventricular tachycardia ([facility])  SOCIAL HX:  Social History   Tobacco Use   Smoking status: Never Smoker   Smokeless tobacco: Never Used  Substance Use Topics   Alcohol use: Yes    Comment: occasional    ALLERGIES:  Allergies  Allergen Reactions   Carafate [Sucralfate]    Levaquin [Levofloxacin In D5w]    Penicillins    Septra [Sulfamethoxazole-Trimethoprim]    Thorazine  [Chlorpromazine] Other (See Comments)   Ceftin [Cefuroxime] Rash     PERTINENT MEDICATIONS:  Outpatient Encounter Medications as of 12/02/2018  Medication Sig   almotriptan (AXERT) 12.5 MG tablet Take by mouth.   baclofen (LIORESAL) 10 MG tablet baclofen 10 mg tablet   Biotin 2.5 MG TABS Take by mouth.   carbidopa-levodopa (SINEMET IR) 25-100 MG tablet 0.5 tablets 6 (six) times daily.   clonazePAM (KLONOPIN) 0.5 MG tablet Take by mouth.   cycloSPORINE (RESTASIS) 0.05 % ophthalmic emulsion 1 drop 2 (two) times daily.   HYDROcodone-acetaminophen (NORCO) 5-325 MG per tablet Take 1 tablet by mouth every 6 (six) hours as needed for moderate pain or severe pain. (Patient not taking: Reported on 01/02/2017)   Lifitegrast (XIIDRA) 5 % SOLN xiidra 5 % soln   medroxyPROGESTERone (PROVERA) 2.5 MG tablet medroxyprogesterone acetate 2.5 mgtabs   meloxicam (MOBIC) 15 MG tablet Take by mouth daily.    Mesalamine (DELZICOL) 400 MG CPDR DR capsule 2 (two) times daily.   mirabegron ER (MYRBETRIQ) 25 MG TB24 tablet myrbetriq 25 mg tb24   nitroGLYCERIN (NITROSTAT) 0.3 MG SL tablet Place 0.3 mg under the tongue every 5 (five) minutes as needed for chest pain.   ondansetron (ZOFRAN ODT) 4 MG disintegrating tablet Take 1 tablet (4 mg total) by mouth every 8 (eight) hours as needed for nausea or vomiting. (Patient not taking: Reported on 11/08/2018)   predniSONE (DELTASONE) 10 MG tablet Take 10 mg by mouth daily with breakfast.   ranitidine (ZANTAC) 300 MG tablet ranitidine hcl 300 mg tabs   rOPINIRole (REQUIP) 1 MG tablet Take by mouth.   selegiline (ELDEPRYL) 5 MG capsule selegiline 5 mg capsule   sertraline (ZOLOFT) 25 MG tablet sertraline 25 mg tablet   simethicone (GAS-X) 80 MG chewable tablet Chew 1 tablet (80 mg total) by mouth every 6 (six) hours as needed for flatulence.   sulfamethoxazole-trimethoprim (BACTRIM DS,SEPTRA DS) 800-160 MG tablet Take 1 tablet by mouth 2 (two) times  daily. (Patient not taking: Reported on 11/08/2018)   traMADol (ULTRAM) 50 MG tablet tramadol 50 mg tablet   trospium (SANCTURA) 20 MG tablet Take 20 mg by mouth 2 (two) times daily.   Vitamin D, Ergocalciferol, (DRISDOL) 50000 units CAPS capsule vitamin d 50000 unit caps   No facility-administered encounter medications on file as of 12/02/2018.     PHYSICAL EXAM / ROS:   Current and past weights: unavailable General: NAD, frail appearing, thin Cardiovascular: no chest pain reported, no edema Pulmonary: no cough, no increased SOB, no DOE Abdomen: appetite good, denies constipation, continent of bowel GU: denies dysuria, incontinent of urine at times MSK:  no joint deformities, ambulatory with assistance, has had falls several times a week.  Skin: no rashes or wounds reported Neurological: Weakness, dementia and forgetfulness with parkinsons  Jason Coop, NP  COVID-19 PATIENT SCREENING TOOL  Person answering questions: ______daughter_____________ _____   1.  Is the patient or any family member in the home showing any signs or symptoms regarding respiratory infection?  Person with Symptom- ____NA_______________________  a. Fever                                                                          Yes___ No___          ___________________  b. Shortness of breath                                                    Yes___ No___          ___________________ c. Cough/congestion                                       Yes___  No___         ___________________ d. Body aches/pains                                                         Yes___ No___        ____________________ e. Gastrointestinal symptoms (diarrhea, nausea)           Yes___ No___        ____________________  2. Within the past 14 days, has anyone living in the home had any contact with someone with or under investigation for COVID-19?    Yes___ [place]__   Person __________________

## 2018-12-03 ENCOUNTER — Ambulatory Visit: Payer: Medicare Other | Admitting: Physical Therapy

## 2018-12-05 ENCOUNTER — Ambulatory Visit: Payer: Medicare Other | Admitting: Physical Therapy

## 2018-12-09 ENCOUNTER — Ambulatory Visit: Payer: Medicare Other | Admitting: Physical Therapy

## 2018-12-11 ENCOUNTER — Ambulatory Visit: Payer: Medicare Other | Admitting: Physical Therapy

## 2018-12-11 ENCOUNTER — Encounter: Payer: Medicare Other | Admitting: Occupational Therapy

## 2019-01-24 ENCOUNTER — Telehealth: Payer: Self-pay

## 2019-01-24 NOTE — Telephone Encounter (Signed)
Phone call placed to patient's caregiver to check in on patient and to offer to schedule a visit with Palliative Care. Caregiver shared that patient has displayed a little more confusion and weakness. Inquired about how patient was doing after the loss of her husband. Caregiver shared that patient has handled it better than she thought she would. Visit scheduled for 02/03/19 @ 3:30pm

## 2019-02-03 ENCOUNTER — Other Ambulatory Visit: Payer: Medicare Other | Admitting: Primary Care

## 2019-02-03 ENCOUNTER — Other Ambulatory Visit: Payer: Self-pay

## 2019-02-03 DIAGNOSIS — Z515 Encounter for palliative care: Secondary | ICD-10-CM

## 2019-02-03 NOTE — Progress Notes (Signed)
[facility] Consult Note Telephone: [phone]  Fax: [phone]    PATIENT NAME: Kelsey Owens [address] [place] [address] [phone] (home)  DOB: March 31, 1940 MRN: [id number]  PRIMARY CARE PROVIDER:   Albina Billet, MD, [address]   [place] [address] [phone]  REFERRING PROVIDER:  Albina Billet, MD [address]   [place],  [place] [address] [phone]  RESPONSIBLE PARTY:   Extended Emergency Contact Information Primary Emergency Contact: Analysia, Dungee Address: [address]          [place], [place] [address] Johnnette Litter of [place] Phone: [phone] Mobile Phone: [phone] Relation: Spouse Secondary Emergency Contact: [place] [employer]: [phone] Relation: Daughter  I saw Kelsey Owens today for palliative care consultation.   ASSESSMENT AND RECOMMENDATIONS:   1. Advance Care Planning/Goals of Care: Goals include to maximize quality of life and symptom management.She has elected Full CPR, comfort measures, antibiotics, iv fluids, and no feeding tube. I will continue to review and record her wishes.  2. Symptom Management:   Safety and Mobility:  We discussed her continuing to get up by herself at night for the bathroom. She did sustain a fall with a hematoma under her left eye and an area of swelling on the right parietal area. She cannot explain how this happened. Her daughter states that she gets out of bed at night and they have a video now that gives an alarm but she still can defeat it. We discussed the importance of using help and not falling since a fracture would be very difficult for her to recover from. We discussed getting a hospital bed. She has talked to her PCP about this and is waiting for the insurance approval. We discussed safety of having rails due to the increased risk of falls as sometimes people will try to climb over them. Her  daughters asked her would she likely do that and she said she might. I would suggest no rails at first to see if she was more comfortable.   We discussed a bedside toilet which she did not want to attempt. We discussed how she gets up and how she uses the wall to get to the bathroom she was still not interested in using the bedside toilet. We discussed limiting evening fluids.  Dyskinsia:  More pronounced today. We reviewed her medicines and I updated the list.    Nutrition: Appetite seems good shes eating more regularly now with her daughters caring for her. Prior,  when her husband was caring for her she was drinking more Ensure.  Apparently he was very ill and trying to care for her a long time during his own illness.   3. Family /Caregiver/Community Supports: Her husband expired died about two months ago. We discussed his rapid passing once he was on hospice. She has her family with her today who are very supportive. She has a full-time caregiver in the home. She has an active church home. Daughters asked about support services, I referred them to [employer]. Patient states her doctor told her when you have seen one parkinson's patient you've seen one parkinson's patient.   4. Cognitive / Functional decline: Alert, oriented. Masked facies but interactive and reliable with some questions, forgetful with others. In w/c most of day. Ambulates to bathroom at hs.   5. Follow up Palliative Care Visit: Palliative care will continue to follow  for goals of care clarification and symptom management. Return 8 weeks or prn.  I spent 60 minutes providing this consultation,  from 1600 to 1700. More than 50% of the time in this consultation was spent coordinating communication.   HISTORY OF PRESENT ILLNESS:  Kelsey Owens is a 78 y.o. year old female with multiple medical problems including Parkinson's disease frequent falls, forgetfulness, caregiver strain. Palliative Care was asked  to follow this patient by consultation request of Albina Billet, MD to help address advance care planning and goals of care. This is a follow up visit.  CODE STATUS: Full code, comfort measures, use of ABX and Iv, no feeding tube.  PPS: 40% HOSPICE ELIGIBILITY/DIAGNOSIS: no  PAST MEDICAL HISTORY:  Past Medical History:  Diagnosis Date   Arthritis    Bursitis    CAD (coronary artery disease)    GERD (gastroesophageal reflux disease)    Hyperlipidemia    Hypertension    Hypophonia    Intercostal pain    Parkinson disease (HCC)    Paroxysmal supraventricular tachycardia (HCC)     SOCIAL HX:  Social History   Tobacco Use   Smoking status: Never Smoker   Smokeless tobacco: Never Used  Substance Use Topics   Alcohol use: Yes    Comment: occasional    ALLERGIES:  Allergies  Allergen Reactions   Carafate [Sucralfate]    Levaquin [Levofloxacin In D5w]    Penicillins    Septra [Sulfamethoxazole-Trimethoprim]    Thorazine [Chlorpromazine] Other (See Comments)   Ceftin [Cefuroxime] Rash     PERTINENT MEDICATIONS:  Outpatient Encounter Medications as of 02/03/2019  Medication Sig   acetaminophen (TYLENOL) 650 MG suppository Place 650 mg rectally every 8 (eight) hours.   baclofen (LIORESAL) 10 MG tablet baclofen 10 mg tablet   Biotin 2.5 MG TABS Take by mouth.   carbidopa-levodopa (SINEMET IR) 25-100 MG tablet 0.5 tablets 6 (six) times daily.   famotidine (PEPCID) 20 MG tablet Take 20 mg by mouth daily as needed for heartburn or indigestion.   FAMOTIDINE PO Take by mouth.   meloxicam (MOBIC) 15 MG tablet Take by mouth daily.    Mesalamine (DELZICOL) 400 MG CPDR DR capsule 2 (two) times daily.   mirabegron ER (MYRBETRIQ) 25 MG TB24 tablet myrbetriq 25 mg tb24   rOPINIRole (REQUIP) 1 MG tablet Take by mouth.   sertraline (ZOLOFT) 25 MG tablet sertraline 25 mg tablet   Vitamin D, Ergocalciferol, (DRISDOL) 50000 units CAPS capsule vitamin d 50000  unit caps   almotriptan (AXERT) 12.5 MG tablet Take by mouth.   clonazePAM (KLONOPIN) 0.5 MG tablet Take by mouth.   cycloSPORINE (RESTASIS) 0.05 % ophthalmic emulsion 1 drop 2 (two) times daily.   HYDROcodone-acetaminophen (NORCO) 5-325 MG per tablet Take 1 tablet by mouth every 6 (six) hours as needed for moderate pain or severe pain. (Patient not taking: Reported on 01/02/2017)   Lifitegrast (XIIDRA) 5 % SOLN xiidra 5 % soln   medroxyPROGESTERone (PROVERA) 2.5 MG tablet medroxyprogesterone acetate 2.5 mgtabs   nitroGLYCERIN (NITROSTAT) 0.3 MG SL tablet Place 0.3 mg under the tongue every 5 (five) minutes as needed for chest pain.   ondansetron (ZOFRAN ODT) 4 MG disintegrating tablet Take 1 tablet (4 mg total) by mouth every 8 (eight) hours as needed for nausea or vomiting. (Patient not taking: Reported on 11/08/2018)   predniSONE (DELTASONE) 10 MG tablet Take 10 mg by mouth daily with breakfast.   ranitidine (ZANTAC) 300 MG tablet ranitidine hcl 300 mg  tabs   selegiline (ELDEPRYL) 5 MG capsule selegiline 5 mg capsule   simethicone (GAS-X) 80 MG chewable tablet Chew 1 tablet (80 mg total) by mouth every 6 (six) hours as needed for flatulence.   sulfamethoxazole-trimethoprim (BACTRIM DS,SEPTRA DS) 800-160 MG tablet Take 1 tablet by mouth 2 (two) times daily. (Patient not taking: Reported on 11/08/2018)   traMADol (ULTRAM) 50 MG tablet tramadol 50 mg tablet   trospium (SANCTURA) 20 MG tablet Take 20 mg by mouth 2 (two) times daily.   No facility-administered encounter medications on file as of 02/03/2019.     PHYSICAL EXAM / ROS:   Current and past weights: 120 lbs estimate  General: NAD, frail appearing, thin Cardiovascular: no chest pain reported, no edema  Pulmonary: no cough, no increased SOB Abdomen: appetite good, denies constipation, continent of bowel GU: denies dysuria, continent of urine MSK:  no joint deformities, ambulatory with help, walker, fell x 1 and hurt  face. Skin: no rashes or wounds reported Neurological: Weakness, sleep at hs, daytime naps minimal.  Jason Coop, NP [facility]  COVID-19 PATIENT SCREENING TOOL  Person answering questions: _________Kari__________ _____   1.  Is the patient or any family member in the home showing any signs or symptoms regarding respiratory infection?               Person with Symptom- _____NA______________________  a. Fever                                                                          Yes___ No___          ___________________  b. Shortness of breath                                                    Yes___ No___          ___________________ c. Cough/congestion                                       Yes___  No___         ___________________ d. Body aches/pains                                                         Yes___ No___        ____________________ e. Gastrointestinal symptoms (diarrhea, nausea)           Yes___ No___        ____________________  2. Within the past 14 days, has anyone living in the home had any contact with someone with or under investigation for COVID-19?    Yes___ No__x   Person __________________

## 2019-03-31 ENCOUNTER — Other Ambulatory Visit: Payer: Medicare Other | Admitting: Primary Care

## 2019-04-01 ENCOUNTER — Other Ambulatory Visit: Payer: Medicare PPO | Admitting: Primary Care

## 2019-04-01 ENCOUNTER — Other Ambulatory Visit: Payer: Self-pay

## 2019-04-01 DIAGNOSIS — Z515 Encounter for palliative care: Secondary | ICD-10-CM

## 2019-04-01 NOTE — Progress Notes (Signed)
[facility] Consult Note Telephone: [phone]  Fax: [phone]   PATIENT NAME: Kelsey Owens [address] [place] [address] [phone] (home)  DOB: Nov 05, 1940 MRN: [id number]  PRIMARY CARE PROVIDER:   Albina Billet, MD, [address]   [place] [address] [phone]  REFERRING PROVIDER:  Albina Billet, MD [address]   [place],  [place] [address] [phone]  RESPONSIBLE PARTY:   Extended Emergency Contact Information Primary Emergency Contact: Kelsey, Owens Address: [address]          [place],  [address] Johnnette Litter of [place] Phone: [phone] Mobile Phone: [phone] Relation: Spouse Secondary Emergency Contact: Owens,Kelsey  [place] [employer] Phone: [phone] Relation: Daughter   I met with Kelsey Owens in her home with caregiver Lujean Amel present. Her daughter Kelsey Owens is at home with covid, and cannot attend.  ASSESSMENT AND RECOMMENDATIONS:   1. Advance Care Planning/Goals of Care: Goals include to maximize quality of life and symptom management. We looked for MOST form which is not posted in the house. Caregiver is not able to find it. We prepared a new one in which she elected some different directives. She requests full CPR, limited hospital care, antibiotics, limited IV no feeding tube. This was scanned to [facility].  2.Symptom Management:   Mobility : Up at night some with help. Has a walker but forgets to get it. Sometimes falls, this is an ongoing issue.  Intake:  States good appetite and recent bm after constipation. Recommended probiotics as having some success with specifically Parkinson's patients.  3. Family /Caregiver/Community Supports: Lives in own home with caregivers (paid). Husband passed in 12/2018, had been caregiver.Adult children also oversee care.   4. Cognitive / Functional decline: Brighter affect than previous visits, States she feels well.  Able to locomote with in w/c with feet.  5. Follow up Palliative Care Visit: Palliative care will continue to follow for goals of care clarification and symptom management. Return 8 weeks or prn.  I spent 60 minutes providing this consultation,  from 1400 to 1500. More than 50% of the time in this consultation was spent coordinating communication.   HISTORY OF PRESENT ILLNESS:  Kelsey Owens is a 79 y.o. year old female with multiple medical problems including CAD, PD, immobility, falls. Palliative Care was asked to follow this patient by consultation request of Albina Billet, MD to help address advance care planning and goals of care. This is a follow up visit.  CODE STATUS: full CPR, limited hospital care, antibiotics, limited IV no feeding tube.  PPS: 40% HOSPICE ELIGIBILITY/DIAGNOSIS: no  PAST MEDICAL HISTORY:  Past Medical History:  Diagnosis Date  . Arthritis   . Bursitis   . CAD (coronary artery disease)   . GERD (gastroesophageal reflux disease)   . Hyperlipidemia   . Hypertension   . Hypophonia   . Intercostal pain   . Parkinson disease ([facility])   . Paroxysmal supraventricular tachycardia ([facility])     SOCIAL HX:  Social History   Tobacco Use  . Smoking status: Never Smoker  . Smokeless tobacco: Never Used  Substance Use Topics  . Alcohol use: Yes    Comment: occasional    ALLERGIES:  Allergies  Allergen Reactions  . Carafate [Sucralfate]   . Levaquin [Levofloxacin In D5w]   . Penicillins   . Septra [Sulfamethoxazole-Trimethoprim]   . Thorazine [Chlorpromazine] Other (See Comments)  . Ceftin [Cefuroxime]  Rash     PERTINENT MEDICATIONS:  Outpatient Encounter Medications as of 04/01/2019  Medication Sig  . acetaminophen (TYLENOL) 650 MG suppository Place 650 mg rectally every 8 (eight) hours.  Marland Kitchen almotriptan (AXERT) 12.5 MG tablet Take by mouth.  . baclofen (LIORESAL) 10 MG tablet baclofen 10 mg tablet  . Biotin 2.5 MG TABS Take by mouth.  .  carbidopa-levodopa (SINEMET IR) 25-100 MG tablet 0.5 tablets 6 (six) times daily.  . clonazePAM (KLONOPIN) 0.5 MG tablet Take by mouth.  . cycloSPORINE (RESTASIS) 0.05 % ophthalmic emulsion 1 drop 2 (two) times daily.  . famotidine (PEPCID) 20 MG tablet Take 20 mg by mouth daily as needed for heartburn or indigestion.  Marland Kitchen FAMOTIDINE PO Take by mouth.  Marland Kitchen HYDROcodone-acetaminophen (NORCO) 5-325 MG per tablet Take 1 tablet by mouth every 6 (six) hours as needed for moderate pain or severe pain. (Patient not taking: Reported on 01/02/2017)  . Lifitegrast (XIIDRA) 5 % SOLN xiidra 5 % soln  . medroxyPROGESTERone (PROVERA) 2.5 MG tablet medroxyprogesterone acetate 2.5 mgtabs  . meloxicam (MOBIC) 15 MG tablet Take by mouth daily.   . Mesalamine (DELZICOL) 400 MG CPDR DR capsule 2 (two) times daily.  . mirabegron ER (MYRBETRIQ) 25 MG TB24 tablet myrbetriq 25 mg tb24  . nitroGLYCERIN (NITROSTAT) 0.3 MG SL tablet Place 0.3 mg under the tongue every 5 (five) minutes as needed for chest pain.  Marland Kitchen ondansetron (ZOFRAN ODT) 4 MG disintegrating tablet Take 1 tablet (4 mg total) by mouth every 8 (eight) hours as needed for nausea or vomiting. (Patient not taking: Reported on 11/08/2018)  . predniSONE (DELTASONE) 10 MG tablet Take 10 mg by mouth daily with breakfast.  . ranitidine (ZANTAC) 300 MG tablet ranitidine hcl 300 mg tabs  . rOPINIRole (REQUIP) 1 MG tablet Take by mouth.  . selegiline (ELDEPRYL) 5 MG capsule selegiline 5 mg capsule  . sertraline (ZOLOFT) 25 MG tablet sertraline 25 mg tablet  . simethicone (GAS-X) 80 MG chewable tablet Chew 1 tablet (80 mg total) by mouth every 6 (six) hours as needed for flatulence.  . sulfamethoxazole-trimethoprim (BACTRIM DS,SEPTRA DS) 800-160 MG tablet Take 1 tablet by mouth 2 (two) times daily. (Patient not taking: Reported on 11/08/2018)  . traMADol (ULTRAM) 50 MG tablet tramadol 50 mg tablet  . trospium (SANCTURA) 20 MG tablet Take 20 mg by mouth 2 (two) times daily.  .  Vitamin D, Ergocalciferol, (DRISDOL) 50000 units CAPS capsule vitamin d 50000 unit caps   No facility-administered encounter medications on file as of 04/01/2019.    PHYSICAL EXAM / ROS:   Current and past weights: 126 lbs, reported  General: NAD, frail appearing, thin Cardiovascular: no chest pain reported, no edema in LE Pulmonary: no cough, no increased SOB, room air Abdomen: appetite good, endorses recent constipation, continent of bowel GU: denies dysuria, continent of urine MSK:  no joint deformities, ambulatory in home with walker, but frequent falls. Skin: no rashes or wounds reported Neurological: Weakness, PD deficits LE dyskinesia, reports good sleep schedule.   Jason Coop, NP, [facility]  COVID-19 PATIENT SCREENING TOOL  Person answering questions: ________Ida___________ _____   1.  Is the patient or any family member in the home showing any signs or symptoms regarding respiratory infection?               Person with Symptom- ___________________________  a. Fever  Yes___ No___          ___________________  b. Shortness of breath                                                    Yes___ No___          ___________________ c. Cough/congestion                                       Yes___  No___         ___________________ d. Body aches/pains                                                         Yes___ No___        ____________________ e. Gastrointestinal symptoms (diarrhea, nausea)           Yes___ No___        ____________________  2. Within the past 14 days, has anyone living in the home had any contact with someone with or under investigation for COVID-19?    Yes___ No_x_   Person __________________

## 2019-06-03 ENCOUNTER — Other Ambulatory Visit: Payer: Self-pay

## 2019-06-03 ENCOUNTER — Other Ambulatory Visit: Payer: Medicare PPO | Admitting: Primary Care

## 2019-06-03 ENCOUNTER — Telehealth: Payer: Self-pay | Admitting: Primary Care

## 2019-06-03 NOTE — Telephone Encounter (Signed)
T/c to confirm appt today. Pt forgot and has another appointment. Rescheduled Palliative appt for May.

## 2019-07-01 ENCOUNTER — Other Ambulatory Visit: Payer: Self-pay

## 2019-07-01 ENCOUNTER — Other Ambulatory Visit: Payer: Medicare PPO | Admitting: Primary Care

## 2019-07-01 DIAGNOSIS — Z515 Encounter for palliative care: Secondary | ICD-10-CM

## 2019-07-01 DIAGNOSIS — G20 Parkinson's disease: Secondary | ICD-10-CM

## 2019-07-01 DIAGNOSIS — G20A1 Parkinson's disease without dyskinesia, without mention of fluctuations: Secondary | ICD-10-CM
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
## 2019-07-29 ENCOUNTER — Encounter (INDEPENDENT_AMBULATORY_CARE_PROVIDER_SITE_OTHER): Payer: Medicare Other

## 2019-07-29 ENCOUNTER — Ambulatory Visit (INDEPENDENT_AMBULATORY_CARE_PROVIDER_SITE_OTHER): Payer: Medicare Other | Admitting: Vascular Surgery

## 2019-08-19 ENCOUNTER — Other Ambulatory Visit (INDEPENDENT_AMBULATORY_CARE_PROVIDER_SITE_OTHER): Payer: Self-pay | Admitting: Vascular Surgery

## 2019-08-19 DIAGNOSIS — M79604 Pain in right leg: Secondary | ICD-10-CM

## 2019-08-24 DIAGNOSIS — I739 Peripheral vascular disease, unspecified: Secondary | ICD-10-CM | POA: Insufficient documentation

## 2019-08-24 DIAGNOSIS — M1990 Unspecified osteoarthritis, unspecified site: Secondary | ICD-10-CM | POA: Insufficient documentation

## 2019-08-24 DIAGNOSIS — K219 Gastro-esophageal reflux disease without esophagitis: Secondary | ICD-10-CM | POA: Insufficient documentation

## 2019-08-24 NOTE — Progress Notes (Signed)
MRN : [id number]  Kelsey Owens is a 79 y.o. (1940-09-18) female who presents with chief complaint of No chief complaint on file. Marland Kitchen  History of Present Illness:    The patient is seen for evaluation of painful lower extremities and diminished pulses. Patient notes the pain is always associated with activity and is very consistent day today. Typically, the pain occurs at less than one block, progress is as activity continues to the point that the patient must stop walking. Resting including standing still for several minutes allowed resumption of the activity and the ability to walk a similar distance before stopping again. Uneven terrain and inclined shorten the distance. The pain has been progressive over the past several years. The patient states the inability to walk is now having a profound negative impact on quality of life and daily activities.  The patient denies rest pain or dangling of an extremity off the side of the bed during the night for relief. No open wounds or sores at this time. No prior interventions or surgeries.  + history of back problems and DJD of the lumbar sacral spine.   The patient denies changes in claudication symptoms or new rest pain symptoms.  No new ulcers or wounds of the foot.  The patient's blood pressure has been stable and relatively well controlled. The patient denies amaurosis fugax or recent TIA symptoms. There are no recent neurological changes noted. The patient denies history of DVT, PE or superficial thrombophlebitis. The patient denies recent episodes of angina or shortness of breath.   ABI's are normal bilaterally  No outpatient medications have been marked as taking for the 08/25/19 encounter (Appointment) with Delana Meyer, Dolores Lory, MD.    Past Medical History:  Diagnosis Date  . Arthritis   . Bursitis   . CAD (coronary artery disease)   . GERD (gastroesophageal reflux disease)   . Hyperlipidemia   . Hypertension   . Hypophonia    . Intercostal pain   . Parkinson disease ([place])   . Paroxysmal supraventricular tachycardia [facility])     Past Surgical History:  Procedure Laterality Date  . ABDOMINAL HYSTERECTOMY    . CHOLECYSTECTOMY    . DEEP BRAIN STIMULATOR PLACEMENT    . FLEXIBLE SIGMOIDOSCOPY N/A 12/19/2017   Procedure: FLEXIBLE SIGMOIDOSCOPY;  Surgeon: Manya Silvas, MD;  Location: [facility] ENDOSCOPY;  Service: Endoscopy;  Laterality: N/A;  . HEMORROIDECTOMY      Social History Social History   Tobacco Use  . Smoking status: Never Smoker  . Smokeless tobacco: Never Used  Substance Use Topics  . Alcohol use: Yes    Comment: occasional  . Drug use: No    Family History Family History  Problem Relation Age of Onset  . Breast cancer Maternal Aunt   . Breast cancer Paternal Grandmother   No family history of bleeding/clotting disorders, porphyria or autoimmune disease   Allergies  Allergen Reactions  . Carafate [Sucralfate]   . Levaquin [Levofloxacin In D5w]   . Penicillins   . Septra [Sulfamethoxazole-Trimethoprim]   . Thorazine [Chlorpromazine] Other (See Comments)  . Ceftin [Cefuroxime] Rash     REVIEW OF SYSTEMS (Negative unless checked)  Constitutional: [] Weight loss  [] Fever  [] Chills Cardiac: [] Chest pain   [] Chest pressure   [] Palpitations   [] Shortness of breath when laying flat   [] Shortness of breath with exertion. Vascular:  [x] Pain in legs with walking   [] Pain in legs at rest  [] History of DVT   [] Phlebitis   []   Swelling in legs   [] Varicose veins   [] Non-healing ulcers Pulmonary:   [] Uses home oxygen   [] Productive cough   [] Hemoptysis   [] Wheeze  [] COPD   [] Asthma Neurologic:  [] Dizziness   [] Seizures   [] History of stroke   [] History of TIA  [] Aphasia   [] Vissual changes   [] Weakness or numbness in arm   [] Weakness or numbness in leg Musculoskeletal:   [] Joint swelling   [x] Joint pain   [x] Low back pain Hematologic:  [] Easy bruising  [] Easy bleeding   [] Hypercoagulable state    [] Anemic Gastrointestinal:  [] Diarrhea   [] Vomiting  [x] Gastroesophageal reflux/heartburn   [] Difficulty swallowing. Genitourinary:  [] Chronic kidney disease   [] Difficult urination  [] Frequent urination   [] Blood in urine Skin:  [] Rashes   [] Ulcers  Psychological:  [] History of anxiety   []  History of major depression.  Physical Examination  There were no vitals filed for this visit. There is no height or weight on file to calculate BMI. Gen: WD/WN, NAD Head: [place]/AT, No temporalis wasting.  Ear/Nose/Throat: Hearing grossly intact, nares w/o erythema or drainage, poor dentition Eyes: PER, EOMI, sclera nonicteric.  Neck: Supple, no masses.  No bruit or JVD.  Pulmonary:  Good air movement, clear to auscultation bilaterally, no use of accessory muscles.  Cardiac: RRR, normal S1, S2, no Murmurs. Vascular: scattered varicosities present bilaterally.  Mild venous stasis changes to the legs bilaterally.  2+ soft pitting edema Vessel Right Left  Radial Palpable Palpable  PT Palpable Palpable  DP Palpable Palpable  Gastrointestinal: soft, non-distended. No guarding/no peritoneal signs.  Musculoskeletal: M/S 5/5 throughout.  No deformity or atrophy.  Neurologic: CN 2-12 intact. Pain and light touch intact in extremities.  Symmetrical.  Speech is fluent. Motor exam as listed above. Psychiatric: Judgment intact, Mood & affect appropriate for pt's clinical situation. Dermatologic: No rashes or ulcers noted.  No changes consistent with cellulitis. Lymph : No Cervical lymphadenopathy, no lichenification or skin changes of chronic lymphedema.  CBC Lab Results  Component Value Date   WBC 6.3 10/18/2017   HGB 13.0 10/18/2017   HCT 38.3 10/18/2017   MCV 89.4 10/18/2017   PLT 184 10/18/2017    BMET    Component Value Date/Time   NA 139 10/18/2017 1454   NA 141 12/15/2013 0407   K 3.8 10/18/2017 1454   K 3.8 12/15/2013 0407   CL 107 10/18/2017 1454   CL 108 (H) 12/15/2013 0407   CO2 25  10/18/2017 1454   CO2 25 12/15/2013 0407   GLUCOSE 100 (H) 10/18/2017 1454   GLUCOSE 115 (H) 12/15/2013 0407   BUN 23 10/18/2017 1454   BUN 25 (H) 12/15/2013 0407   CREATININE 1.00 10/18/2017 1454   CREATININE 1.09 12/15/2013 0407   CALCIUM 8.9 10/18/2017 1454   CALCIUM 8.2 (L) 12/15/2013 0407   GFRNONAA 53 (L) 10/18/2017 1454   GFRNONAA 52 (L) 12/15/2013 0407   GFRNONAA >60 06/05/2012 1148   GFRAA >60 10/18/2017 1454   GFRAA >60 12/15/2013 0407   GFRAA >60 06/05/2012 1148   CrCl cannot be calculated (Patient's most recent lab result is older than the maximum 21 days allowed.).  COAG Lab Results  Component Value Date   INR 1.0 12/13/2013    Radiology No results found.   Assessment/Plan 1. Pain and swelling of lower leg, unspecified laterality Recommend:  I do not find evidence of Vascular pathology that would explain the patient's symptoms  The patient has atypical pain symptoms for vascular disease  I  do not find evidence of Vascular pathology that would explain the patient's symptoms and I suspect the patient is c/o pseudoclaudication.  Patient should have an evaluation of his LS spine which I defer to the primary service.  Noninvasive studies including venous ultrasound of the legs do not identify vascular problems  The patient should continue walking and begin a more formal exercise program. The patient should continue his antiplatelet therapy and aggressive treatment of the lipid abnormalities. The patient should begin wearing graduated compression socks 15-20 mmHg strength to control her mild edema.  Patient will follow-up with me on a PRN basis  Further work-up of her lower extremity pain is deferred to the primary service     2. Primary osteoarthritis involving multiple joints Continue NSAID medications as already ordered, these medications have been reviewed and there are no changes at this time.  Continued activity and therapy was stressed.   3. CAD  in native artery Continue cardiac and antihypertensive medications as already ordered and reviewed, no changes at this time.  Continue statin as ordered and reviewed, no changes at this time  Nitrates PRN for chest pain   4. Benign essential HTN Continue antihypertensive medications as already ordered, these medications have been reviewed and there are no changes at this time.   5. Gastroesophageal reflux disease without esophagitis Continue PPI as already ordered, this medication has been reviewed and there are no changes at this time.  Avoidence of caffeine and alcohol  Moderate elevation of the head of the bed    Hortencia Pilar, MD  08/24/2019 3:57 PM

## 2019-08-25 ENCOUNTER — Other Ambulatory Visit: Payer: Self-pay

## 2019-08-25 ENCOUNTER — Encounter (INDEPENDENT_AMBULATORY_CARE_PROVIDER_SITE_OTHER): Payer: Self-pay | Admitting: Vascular Surgery

## 2019-08-25 ENCOUNTER — Ambulatory Visit (INDEPENDENT_AMBULATORY_CARE_PROVIDER_SITE_OTHER): Payer: Medicare PPO

## 2019-08-25 ENCOUNTER — Ambulatory Visit (INDEPENDENT_AMBULATORY_CARE_PROVIDER_SITE_OTHER): Payer: Medicare Other | Admitting: Vascular Surgery

## 2019-08-25 VITALS — BP 116/77 | HR 92 | Ht 64.0 in | Wt 125.0 lb

## 2019-08-25 DIAGNOSIS — M79604 Pain in right leg: Secondary | ICD-10-CM | POA: Diagnosis not present

## 2019-08-25 DIAGNOSIS — M79669 Pain in unspecified lower leg: Secondary | ICD-10-CM

## 2019-08-25 DIAGNOSIS — M8949 Other hypertrophic osteoarthropathy, multiple sites: Secondary | ICD-10-CM | POA: Diagnosis not present

## 2019-08-25 DIAGNOSIS — I2510 Atherosclerotic heart disease of native coronary artery without angina pectoris: Secondary | ICD-10-CM

## 2019-08-25 DIAGNOSIS — K219 Gastro-esophageal reflux disease without esophagitis: Secondary | ICD-10-CM

## 2019-08-25 DIAGNOSIS — M159 Polyosteoarthritis, unspecified: Secondary | ICD-10-CM

## 2019-08-25 DIAGNOSIS — I10 Essential (primary) hypertension: Secondary | ICD-10-CM | POA: Diagnosis not present

## 2019-08-25 DIAGNOSIS — M7989 Other specified soft tissue disorders: Secondary | ICD-10-CM

## 2019-08-25 DIAGNOSIS — K2990 Gastroduodenitis, unspecified, without bleeding: Secondary | ICD-10-CM | POA: Insufficient documentation

## 2019-08-31 ENCOUNTER — Encounter (INDEPENDENT_AMBULATORY_CARE_PROVIDER_SITE_OTHER): Payer: Self-pay | Admitting: Vascular Surgery

## 2019-08-31 DIAGNOSIS — M7989 Other specified soft tissue disorders: Secondary | ICD-10-CM | POA: Insufficient documentation

## 2019-11-13 ENCOUNTER — Emergency Department: Payer: Medicare PPO

## 2019-11-13 ENCOUNTER — Emergency Department
Admission: EM | Admit: 2019-11-13 | Discharge: 2019-11-13 | Disposition: A | Discharge status: Home / Self Care | Payer: Medicare PPO | Attending: Emergency Medicine | Admitting: Emergency Medicine

## 2019-11-13 ENCOUNTER — Other Ambulatory Visit: Payer: Self-pay

## 2019-11-13 DIAGNOSIS — Z5321 Procedure and treatment not carried out due to patient leaving prior to being seen by health care provider: Secondary | ICD-10-CM | POA: Insufficient documentation

## 2019-11-13 DIAGNOSIS — R079 Chest pain, unspecified: Secondary | ICD-10-CM | POA: Insufficient documentation

## 2019-11-13 DIAGNOSIS — R05 Cough: Secondary | ICD-10-CM | POA: Insufficient documentation

## 2019-11-13 LAB — TROPONIN I (HIGH SENSITIVITY)
Troponin I (High Sensitivity): 5 ng/L (ref <18)
Troponin I (High Sensitivity): 6 ng/L (ref <18)

## 2019-11-13 LAB — CBC
HCT: 35.8 % — ABNORMAL LOW (ref 36.0–46.0)
Hemoglobin: 12.7 g/dL (ref 12.0–15.0)
MCH: 32.4 pg (ref 26.0–34.0)
MCHC: 35.5 g/dL (ref 30.0–36.0)
MCV: 91.3 fL (ref 80.0–100.0)
Platelets: 237 10*3/uL (ref 150–400)
RBC: 3.92 MIL/uL (ref 3.87–5.11)
RDW: 13.3 % (ref 11.5–15.5)
WBC: 7.4 10*3/uL (ref 4.0–10.5)
nRBC: 0 % (ref 0.0–0.2)

## 2019-11-13 LAB — BASIC METABOLIC PANEL
Anion gap: 10 (ref 5–15)
BUN: 35 mg/dL — ABNORMAL HIGH (ref 8–23)
CO2: 25 mmol/L (ref 22–32)
Calcium: 8.9 mg/dL (ref 8.9–10.3)
Chloride: 105 mmol/L (ref 98–111)
Creatinine, Ser: 0.96 mg/dL (ref 0.44–1.00)
GFR calc Af Amer: >60 mL/min (ref >60)
GFR calc non Af Amer: 56 mL/min — ABNORMAL LOW (ref >60)
Glucose, Bld: 127 mg/dL — ABNORMAL HIGH (ref 70–99)
Potassium: 4.5 mmol/L (ref 3.5–5.1)
Sodium: 140 mmol/L (ref 135–145)

## 2019-11-13 NOTE — ED Notes (Signed)
BIB [facility] from home with onset of CP while on cough today. Had 324 ASA, 1 nitro spray and pain alleviated with nitro. 175/95 98% on RA, HR 103, 97.8 temp, CBG 148. 18 R AC

## 2019-11-13 NOTE — ED Triage Notes (Signed)
Squeezing CP that lasted approx 4-5 hours PTA, pt denies pain at this time. States this has occurred before. Pt alert and oriented X4, cooperative, RR even and unlabored, color WNL. Pt in NAD.

## 2019-11-27 ENCOUNTER — Telehealth: Payer: Self-pay

## 2019-11-27 NOTE — Telephone Encounter (Signed)
[place] for [employer] in Dementia

## 2019-12-29 ENCOUNTER — Ambulatory Visit: Admit: 2019-12-29 | Discharge status: Against Medical Advice | Payer: Self-pay

## 2019-12-29 ENCOUNTER — Other Ambulatory Visit: Payer: Self-pay

## 2019-12-29 ENCOUNTER — Ambulatory Visit (INDEPENDENT_AMBULATORY_CARE_PROVIDER_SITE_OTHER): Payer: Medicare PPO

## 2019-12-29 ENCOUNTER — Ambulatory Visit
Admission: EM | Admit: 2019-12-29 | Discharge: 2019-12-29 | Disposition: A | Discharge status: Home / Self Care | Payer: Medicare PPO | Attending: Family Medicine | Admitting: Family Medicine

## 2019-12-29 DIAGNOSIS — W19XXXA Unspecified fall, initial encounter: Secondary | ICD-10-CM

## 2019-12-29 DIAGNOSIS — M542 Cervicalgia: Secondary | ICD-10-CM

## 2019-12-29 DIAGNOSIS — M545 Low back pain, unspecified: Secondary | ICD-10-CM

## 2019-12-29 DIAGNOSIS — S61210A Laceration without foreign body of right index finger without damage to nail, initial encounter: Secondary | ICD-10-CM

## 2019-12-29 DIAGNOSIS — S0990XA Unspecified injury of head, initial encounter: Secondary | ICD-10-CM

## 2019-12-29 HISTORY — DX: Ulcerative colitis, unspecified, without complications: K51.90

## 2019-12-29 NOTE — ED Provider Notes (Signed)
[facility]    CSN: [id number] Arrival date & time: 12/29/19  1335      History   Chief Complaint Chief Complaint  Patient presents with  . Laceration   HPI  79 year old female presents for evaluation after suffering a fall.  She has a laceration to the right index finger.  Fall happened around 1030 this morning.  I was able to visualize the actual fall as patient's daughters have surveillance cameras in the home.  Patient was sitting on the couch and subsequently eased down to the floor.  She sat on the floor and subsequently fell over hitting her head.  Patient denies headache at this time.  She does report neck pain.  She suffered a laceration to the right index finger at the PIP joint on the volar aspect.  Unsure of what she cut herself with.  Bleeding is well controlled.  Patient is well paid at this time.  No other associated symptoms.  No other complaints.   Past Medical History:  Diagnosis Date  . Arthritis   . Bursitis   . CAD (coronary artery disease)   . GERD (gastroesophageal reflux disease)   . Hyperlipidemia   . Hypertension   . Hypophonia   . Intercostal pain   . Parkinson disease ([place])   . Paroxysmal supraventricular tachycardia ([place])   . Ulcerative colitis [facility])     Patient Active Problem List   Diagnosis Date Noted  . Pain and swelling of lower leg 08/31/2019  . Gastritis and duodenitis 08/25/2019  . PAD (peripheral artery disease) ([place]) 08/24/2019  . GERD (gastroesophageal reflux disease) 08/24/2019  . DJD (degenerative joint disease) 08/24/2019  . Rotator cuff tear arthropathy 10/01/2018  . Strain of rotator cuff capsule 10/01/2018  . Chronic ulcerative proctitis, without complications ([place]) 29/79/8921  . Elevated alkaline phosphatase level 06/26/2018  . Leg pain 01/02/2017  . Parkinson's disease ([place]) 01/02/2017  . Contusion of lower leg 12/13/2016  . Arthropathy of lumbar facet joint 11/01/2016  . Benign essential HTN 05/01/2016    . Fall at home, subsequent encounter 05/01/2016  . Intercostal pain 05/01/2016  . Acute constipation 11/18/2015  . Paroxysmal supraventricular tachycardia ([place]) 11/04/2015  . Bursitis of hip 06/14/2015  . Impingement syndrome of shoulder region 06/14/2015  . Hyperlipidemia, mixed 12/23/2013  . CAD in native artery 12/01/2013  . Hypophonia 12/10/2012  . Sialorrhea 12/10/2012  . Diverticulosis 09/26/2006    Past Surgical History:  Procedure Laterality Date  . ABDOMINAL HYSTERECTOMY    . CHOLECYSTECTOMY    . DEEP BRAIN STIMULATOR PLACEMENT    . FLEXIBLE SIGMOIDOSCOPY N/A 12/19/2017   Procedure: FLEXIBLE SIGMOIDOSCOPY;  Surgeon: Manya Silvas, MD;  Location: [facility] ENDOSCOPY;  Service: Endoscopy;  Laterality: N/A;  . HEMORROIDECTOMY      OB History   No obstetric history on file.      Home Medications    Prior to Admission medications   Medication Sig Start Date End Date Taking? Authorizing Provider  acetaminophen (TYLENOL 8 HOUR) 650 MG CR tablet Take by mouth.   Yes [provider]  Biotin 2.5 MG TABS Take by mouth.   Yes [provider]  carbidopa-levodopa (SINEMET IR) 25-100 MG tablet 1 tablet 4 (four) times daily. And 0.5 mg twice daily   Yes [provider]  clonazePAM (KLONOPIN) 0.5 MG tablet Take 0.5 mg by mouth at bedtime.  12/14/16 12/29/19 Yes [provider]  Docusate Sodium (DSS) 100 MG CAPS Take by mouth.   Yes  [provider]  Mesalamine (DELZICOL) 400 MG CPDR DR capsule 2 (two) times daily.   Yes [provider]  Mesalamine (DELZICOL) 400 MG CPDR DR capsule delzicol 400 mg cpdr   Yes [provider]  mirabegron ER (MYRBETRIQ) 25 MG TB24 tablet myrbetriq 25 mg tb24   Yes [provider]  omeprazole (PRILOSEC) 10 MG capsule Take 10 mg by mouth daily.   Yes [provider]  rOPINIRole (REQUIP) 1 MG tablet Take by mouth. 06/19/16  Yes [provider]  traZODone (DESYREL) 50 MG  tablet Take 50 mg by mouth at bedtime.   Yes [provider]  Vitamin D, Ergocalciferol, (DRISDOL) 50000 units CAPS capsule vitamin d 50000 unit caps   Yes [provider]  famotidine (PEPCID) 20 MG tablet Take 20 mg by mouth daily as needed for heartburn or indigestion.   12/29/19 Yes [provider]  lactobacillus acidophilus (BACID) TABS tablet Take 2 tablets by mouth 3 (three) times daily.    [provider]  cetirizine (ZYRTEC) 10 MG tablet Take by mouth.  12/29/19  [provider]  ranitidine (ZANTAC) 150 MG tablet ranitidine hcl 150 mg tabs  12/29/19  [provider]  sertraline (ZOLOFT) 25 MG tablet sertraline 25 mg tablet 02/04/16 12/29/19  [provider]    Family History Family History  Problem Relation Age of Onset  . Breast cancer Maternal Aunt   . Breast cancer Paternal Grandmother     Social History Social History   Tobacco Use  . Smoking status: Never Smoker  . Smokeless tobacco: Never Used  Vaping Use  . Vaping Use: Never used  Substance Use Topics  . Alcohol use: Yes    Comment: occasional  . Drug use: No     Allergies   Cefuroxime axetil, Carafate [sucralfate], Levaquin [levofloxacin in d5w], Penicillins, Septra [sulfamethoxazole-trimethoprim], Thorazine [chlorpromazine], Cefuroxime, and Levaquin [levofloxacin]   Review of Systems Review of Systems  Musculoskeletal: Positive for neck pain.  Skin: Positive for wound.   Physical Exam Triage Vital Signs ED Triage Vitals  Enc Vitals Group     BP 12/29/19 1451 133/63     Pulse Rate 12/29/19 1451 (!) 103     Resp 12/29/19 1451 18     Temp 12/29/19 1451 97.6 F (36.4 C)     Temp Source 12/29/19 1451 Oral     SpO2 12/29/19 1451 96 %     Weight --      Height --      Head Circumference --      Peak Flow --      Pain Score 12/29/19 1646 0     Pain Loc --      Pain Edu? --      Excl. in [place]? --    Updated Vital Signs BP 133/63 (BP  Location: Left Arm)   Pulse (!) 103   Temp 97.6 F (36.4 C) (Oral)   Resp 18   SpO2 96%   Visual Acuity Right Eye Distance:   Left Eye Distance:   Bilateral Distance:    Right Eye Near:   Left Eye Near:    Bilateral Near:     Physical Exam Vitals and nursing note reviewed.  Constitutional:      General: She is not in acute distress.    Appearance: Normal appearance. She is not ill-appearing.  HENT:     Head: Normocephalic and atraumatic.  Eyes:     General:  Right eye: No discharge.        Left eye: No discharge.     Conjunctiva/sclera: Conjunctivae normal.  Neck:     Comments: Patient endorsing posterior neck pain. Cardiovascular:     Rate and Rhythm: Normal rate and regular rhythm.  Pulmonary:     Effort: Pulmonary effort is normal. No respiratory distress.     Breath sounds: Normal breath sounds.  Skin:    Comments: 2 cm linear laceration noted on the palmar/volar aspect of the right index finger at the PIP joint.  Flexor tendon is visible.  No apparent injury to the flexor tendon.  She is able to flex fully.  Neurological:     Mental Status: She is alert.  Psychiatric:        Mood and Affect: Mood normal.        Behavior: Behavior normal.    UC Treatments / Results  Labs (all labs ordered are listed, but only abnormal results are displayed) Labs Reviewed - No data to display  EKG   Radiology CT Head Wo Contrast  Result Date: 12/29/2019 CLINICAL DATA:  79 year old female with a history of head trauma EXAM: CT HEAD WITHOUT CONTRAST CT CERVICAL SPINE WITHOUT CONTRAST TECHNIQUE: Multidetector CT imaging of the head and cervical spine was performed following the standard protocol without intravenous contrast. Multiplanar CT image reconstructions of the cervical spine were also generated. COMPARISON:  10/28/2017 FINDINGS: CT HEAD FINDINGS Brain: Redemonstration of bilateral frontal approach deep brain stimulator electrodes, terminating in the bilateral  thalami. The electrode apparatus in the left frontal parietal scalp again contributes to streak artifact limiting evaluation of the extra-axial spaces on the left. No evidence of acute intracranial hemorrhage.  No midline shift. The bifrontal diameter measures slightly larger than the comparison CT, now 38 mm, previously 36 mm. Slight enlargement in the diameter of the posterior horn left lateral ventricle, now 20 mm, previously 15 mm. Volume loss of the bilateral cerebral hemispheres. Patchy hypodensity in the bilateral periventricular white matter, similar to the comparison. Gray-white differentiation is maintained. Vascular: Calcifications of the intracranial vasculature. Skull: No acute skull fracture. The electrodes associated with deep brain stimulator, again within the left frontal parietal scalp soft tissues contributing to streak artifact. Sinuses/Orbits: Unremarkable appearance of the orbits. Mastoid air cells clear. No middle ear effusion. No significant sinus disease. Other: None CT CERVICAL SPINE FINDINGS Alignment: Straightening and slight reversal the normal cervical lordosis, potentially positional. Trace anterolisthesis of C3 on C4 measuring 2 mm. Facets are aligned. Skull base and vertebrae: Degenerative changes at the bilateral occipital condyles without skull base fracture identified. No acute fracture of the cervical vertebral bodies. Vertebral body heights maintained Soft tissues and spinal canal: Unremarkable cervical soft tissues. Disc levels: Posterior disc osteophyte complex at C2-C3 without significant canal narrowing or foraminal narrowing. Posterior disc osteophyte complex at C3-C4 with associated uncovertebral joint disease and mild anterior translation/anterolisthesis of 2 mm-3 mm. Associated bilateral facet disease. This contributes to bilateral left greater than right foraminal narrowing Posterior disc osteophyte complex at C4-C5 with uncovertebral joint disease and left greater than  right facet changes contributes to left greater than right foraminal narrowing. Posterior disc osteophyte complex at C5-C6 without significant bony canal narrowing. Uncovertebral joint disease contributes to minimal foraminal narrowing. C6-C7 demonstrates posterior disc osteophyte complex with uncovertebral joint disease. No significant foraminal narrowing or bony canal narrowing. Upper chest: Unremarkable Other: None IMPRESSION: Head CT: No acute intracranial abnormality. Redemonstration of bilateral deep brain stimulators. Evidence of chronic  microvascular ischemic disease with associated intracranial atherosclerosis. Brain volume loss with associated enlarged ventricles. Cervical CT: No acute fracture or malalignment of the cervical spine. Degenerative changes as above. Electronically Signed   By: Corrie Mckusick D.O.   On: 12/29/2019 16:28   CT Cervical Spine Wo Contrast  Result Date: 12/29/2019 CLINICAL DATA:  79 year old female with a history of head trauma EXAM: CT HEAD WITHOUT CONTRAST CT CERVICAL SPINE WITHOUT CONTRAST TECHNIQUE: Multidetector CT imaging of the head and cervical spine was performed following the standard protocol without intravenous contrast. Multiplanar CT image reconstructions of the cervical spine were also generated. COMPARISON:  10/28/2017 FINDINGS: CT HEAD FINDINGS Brain: Redemonstration of bilateral frontal approach deep brain stimulator electrodes, terminating in the bilateral thalami. The electrode apparatus in the left frontal parietal scalp again contributes to streak artifact limiting evaluation of the extra-axial spaces on the left. No evidence of acute intracranial hemorrhage.  No midline shift. The bifrontal diameter measures slightly larger than the comparison CT, now 38 mm, previously 36 mm. Slight enlargement in the diameter of the posterior horn left lateral ventricle, now 20 mm, previously 15 mm. Volume loss of the bilateral cerebral hemispheres. Patchy hypodensity in  the bilateral periventricular white matter, similar to the comparison. Gray-white differentiation is maintained. Vascular: Calcifications of the intracranial vasculature. Skull: No acute skull fracture. The electrodes associated with deep brain stimulator, again within the left frontal parietal scalp soft tissues contributing to streak artifact. Sinuses/Orbits: Unremarkable appearance of the orbits. Mastoid air cells clear. No middle ear effusion. No significant sinus disease. Other: None CT CERVICAL SPINE FINDINGS Alignment: Straightening and slight reversal the normal cervical lordosis, potentially positional. Trace anterolisthesis of C3 on C4 measuring 2 mm. Facets are aligned. Skull base and vertebrae: Degenerative changes at the bilateral occipital condyles without skull base fracture identified. No acute fracture of the cervical vertebral bodies. Vertebral body heights maintained Soft tissues and spinal canal: Unremarkable cervical soft tissues. Disc levels: Posterior disc osteophyte complex at C2-C3 without significant canal narrowing or foraminal narrowing. Posterior disc osteophyte complex at C3-C4 with associated uncovertebral joint disease and mild anterior translation/anterolisthesis of 2 mm-3 mm. Associated bilateral facet disease. This contributes to bilateral left greater than right foraminal narrowing Posterior disc osteophyte complex at C4-C5 with uncovertebral joint disease and left greater than right facet changes contributes to left greater than right foraminal narrowing. Posterior disc osteophyte complex at C5-C6 without significant bony canal narrowing. Uncovertebral joint disease contributes to minimal foraminal narrowing. C6-C7 demonstrates posterior disc osteophyte complex with uncovertebral joint disease. No significant foraminal narrowing or bony canal narrowing. Upper chest: Unremarkable Other: None IMPRESSION: Head CT: No acute intracranial abnormality. Redemonstration of bilateral deep  brain stimulators. Evidence of chronic microvascular ischemic disease with associated intracranial atherosclerosis. Brain volume loss with associated enlarged ventricles. Cervical CT: No acute fracture or malalignment of the cervical spine. Degenerative changes as above. Electronically Signed   By: Corrie Mckusick D.O.   On: 12/29/2019 16:28    Procedures Laceration Repair  Date/Time: 12/29/2019 5:09 PM Performed by: Coral Spikes, DO Authorized by: Coral Spikes, DO   Consent:    Consent obtained:  Verbal   Consent given by:  Patient Anesthesia (see MAR for exact dosages):    Anesthesia method:  Local infiltration   Local anesthetic:  Lidocaine 1% w/o epi Laceration details:    Location:  Finger   Finger location:  R index finger   Length (cm):  2 Repair type:    Repair type:  Simple Pre-procedure details:    Preparation:  Patient was prepped and draped in usual sterile fashion Exploration:    Hemostasis achieved with:  Direct pressure   Wound exploration: wound explored through full range of motion     Wound extent: no tendon damage noted     Contaminated: no   Treatment:    Area cleansed with:  Betadine   Irrigation solution:  Sterile water   Irrigation method:  Syringe Skin repair:    Repair method:  Sutures   Suture size:  5-0   Suture material:  Nylon   Suture technique:  Simple interrupted   Number of sutures:  4 Approximation:    Approximation:  Close Post-procedure details:    Patient tolerance of procedure:  Tolerated well, no immediate complications   (including critical care time)  Medications Ordered in UC Medications - No data to display  Initial Impression / Assessment and Plan / UC Course  I have reviewed the triage vital signs and the nursing notes.  Pertinent labs & imaging results that were available during my care of the patient were reviewed by me and considered in my medical decision making (see chart for details).    79 year old female  presents for evaluation after suffering a fall and laceration.  Laceration was repaired as above.  Sutures out in 10 days.  Given fall, advanced age, and reports of neck pain CT head and neck was obtained.  No acute findings.  Tylenol 1000 mg 3 times daily as needed.  Final Clinical Impressions(s) / UC Diagnoses   Final diagnoses:  Laceration of right index finger without foreign body without damage to nail, initial encounter  Injury of head, initial encounter  Neck pain     Discharge Instructions     CT negative.  Sutures out in 10 days.  Keep clean - soap and water.  Tylenol 1000 mg 3 times daily as needed for pain.  Take care  Dr. Lacinda Axon    ED Prescriptions    None     PDMP not reviewed this encounter.   Coral Spikes, [place] 12/29/19 1712

## 2019-12-29 NOTE — Discharge Instructions (Signed)
CT negative.  Sutures out in 10 days.  Keep clean - soap and water.  Tylenol 1000 mg 3 times daily as needed for pain.  Take care  Dr. Lacinda Axon

## 2019-12-29 NOTE — ED Triage Notes (Signed)
Pt cut finger this morning while caregiver stepped out of room.  Happened approx 1030 this morning.

## 2020-02-02 ENCOUNTER — Telehealth: Payer: Self-pay

## 2020-02-02 NOTE — Telephone Encounter (Signed)
Phone call placed by Volunteer with Palliative care to check in on patient. No answer received.

## 2020-02-03 ENCOUNTER — Telehealth: Payer: Self-pay | Admitting: Primary Care

## 2020-02-03 NOTE — Telephone Encounter (Signed)
T/c to daughter to schedule home visit. Message left.

## 2020-04-14 ENCOUNTER — Telehealth: Payer: Self-pay | Admitting: Primary Care

## 2020-04-14 NOTE — Telephone Encounter (Signed)
T/c to home and to daughter to f/u with  [facility] visit. No answer, messages left.

## 2020-04-27 ENCOUNTER — Other Ambulatory Visit: Payer: Medicare PPO | Admitting: Primary Care

## 2020-04-27 ENCOUNTER — Other Ambulatory Visit: Payer: Self-pay

## 2020-04-27 DIAGNOSIS — R3915 Urgency of urination: Secondary | ICD-10-CM

## 2020-04-27 DIAGNOSIS — G20A1 Parkinson's disease without dyskinesia, without mention of fluctuations: Secondary | ICD-10-CM

## 2020-04-27 DIAGNOSIS — G20 Parkinson's disease: Secondary | ICD-10-CM

## 2020-04-27 DIAGNOSIS — F5101 Primary insomnia: Secondary | ICD-10-CM

## 2020-04-27 DIAGNOSIS — Z8719 Personal history of other diseases of the digestive system: Secondary | ICD-10-CM

## 2020-04-27 NOTE — Progress Notes (Signed)
[facility] Consult Note Telephone: [phone]  Fax: [phone]    Date of encounter: 04/27/20 PATIENT NAME: Kelsey Owens [address] [place] [facility] [address] [phone] (home)  DOB: 1940/06/11 MRN: [id number]  PRIMARY CARE PROVIDER:    Albina Billet, MD,  [address]   [place] [address] [phone]  REFERRING PROVIDER:   Albina Billet, MD [address]   [place],  [place] [address] [phone]  RESPONSIBLE PARTY:   Extended Emergency Contact Information Primary Emergency Contact: Teresita of [employer] Phone: [phone] Relation: Daughter Secondary Emergency Contact: Alisi, Lupien Mobile Phone: [phone] Relation: Daughter  I met face to face with patient and family in home. Both daughters and caregivers were also present. Palliative Care was asked to follow this patient by consultation request of Albina Billet, MD to help address advance care planning and goals of care. This is a follow up visit.   ASSESSMENT AND RECOMMENDATIONS:   1. Advance Care Planning/Goals of Care: Goals include to maximize quality of life and symptom management. Our advance care planning conversation included a discussion about:     The value and importance of advance care planning   Experiences with loved ones who have been seriously ill or have died   Exploration of personal, cultural or spiritual beliefs that might influence medical decisions   Exploration of goals of care in the event of a sudden injury or illness   Review and updating of   advance directive documents .  Decision not to resuscitate and  to de-escalate disease focused treatments due to poor prognosis.  Advance care planning: we reviewed her decisions from 9 to 12 months ago. At that time she was asking for full scope of interventions. Today we discussed the resuscitation process, should her heart stop. She said on  two occasions that if her heart stopped to just let her go. She also endorsed wanting  measures that could lengthen her life with good quality, such as limited measures, use of antibiotics,  use of IVs,  but no long-term feeding tube. Her daughters were in attendance of this meeting as well and were in agreement. Patient could sign this form a year ago but today is unable to complete her signature. Her daughter had signed on her behalf. This will be uploaded to vynca.    I spent 25 minutes providing this consultation,  from 1010 to 1035. More than 50% of the time in this consultation was spent in counseling and care coordination. ----------------------------------------------------------------------------------------------------------------------------------------------------------------------------------------------------------------------  2. Symptom Management:   Insomnia/urgency: Insomnia is much improved since she increased trazodone. Caregiver says that she is sleeping well through the night most nights. She does not always get up through the night with urgency. She does continue to have some urgency throughout the day on some days. She denies any dysuria at this time.   Medication review meds were all updated and reviewed in her chart. Med teaching was given regarding indications and side effects.   PD: We discussed limitations of Parkinson's and the usefulness of exercise to keep mobility optimized. She does have a DBS and per neurology note in November it will likely need to be replaced in the next 6-9 months. They have scheduled follow up with the DBS clinic in May/June for further discussions. She does have dyskinesias noted today on exam which she did not have on exam at her neurology appt in November. We discussed  this with family. They will check her DBS battery life and discuss further at upcoming appointment.  Patient exhibit some new tardive dyskinesia. I have referred her back to  neurology, Parkinson's clinic , to assess her implanted device as well as assess if she needs any additional medication adjustments. This is a fairly new development.   Mobility remains compromised. She has many falls. So far no fractures which is fortunate. At night she has been observed on camera as crawling off her bed and crawling out all fours to the bathroom. This seems to be compensatory for her lack of security in ambulation. She can then in the bathroom pull her self up onto the toilet use it and crawl back to bed.  Needs ongoing monitoring by caregivers and assistance with mobility.  Constipation: This is an ongoing issue. She had a BM today. She has a hx of UC. She is on a bowel regimen which is working welling, which is a titration of miralax dosing by caregivers.  3. Follow up Palliative Care Visit: Palliative care will continue to follow for goals of care clarification and symptom management. Return 6-8 weeks or prn.  4. Family /Caregiver/Community Supports: Patient has family that is very involved with her care. She has 24 hour caregiver support. They change weekly and we are able to meet both of them during this visit/shift change.  5. Cognitive / Functional decline: Her caregiver states that she is not walking anymore. She is wheelchair bound. They transfer to the wheelchair to get to the bathroom and around the house. The patient has been known to lower herself to her knees and scoot when wheelchair is not immediately available. Patient was previously seeing PT/OT/Speech, but has finished that last week.   CODE STATUS: DNR, limited scope, use of abx and limited  iv,  No feeding tube.  PPS: 40%  HOSPICE ELIGIBILITY/DIAGNOSIS: TBD  Subjective:  CHIEF COMPLAINT: increased dsykinesias  HISTORY OF PRESENT ILLNESS:  Kelsey Owens is a 80 y.o. year old female  with multiple medical problems including PD, caregiver strain, frequent falls. She presents today with increased  dyskinesias not present on last visit in context of parkinson's disease. These movements appear to be keeping her from resting and cause fall risk to increase. Increase in hs trazodone has helped hs movements, nothing hs helped day time. Is for reassessment of DBS device, and family will move up appt set for June to earlier.   Palliative Care was asked to follow this patient by consultation request of Albina Billet, MD to help address advance care planning and goals of care.  We are asked to consult around advance care planning and complex medical decision making.    Review and summarization of old Epic records shows or history from other than patient. Notes from most recent neurology visit. Review or lab tests, radiology, or medicine. Reviewed medication changes from most recent neurology visit. History obtained from review of EMR, discussion with primary team, and  interview with family, caregiver  and/or Ms. Galati. Records reviewed and summarized above.   CURRENT PROBLEM LIST:  Patient Active Problem List   Diagnosis Date Noted  . Pain and swelling of lower leg 08/31/2019  . Gastritis and duodenitis 08/25/2019  . PAD (peripheral artery disease) ([place]) 08/24/2019  . GERD (gastroesophageal reflux disease) 08/24/2019  . DJD (degenerative joint disease) 08/24/2019  . Rotator cuff tear arthropathy 10/01/2018  . Strain of rotator cuff capsule 10/01/2018  . Chronic ulcerative proctitis, without complications ([place])  06/26/2018  . Elevated alkaline phosphatase level 06/26/2018  . Leg pain 01/02/2017  . Parkinson's disease ([place]) 01/02/2017  . Contusion of lower leg 12/13/2016  . Arthropathy of lumbar facet joint 11/01/2016  . Benign essential HTN 05/01/2016  . Fall at home, subsequent encounter 05/01/2016  . Intercostal pain 05/01/2016  . Acute constipation 11/18/2015  . Paroxysmal supraventricular tachycardia ([place]) 11/04/2015  . Bursitis of hip 06/14/2015  . Impingement syndrome of shoulder  region 06/14/2015  . Hyperlipidemia, mixed 12/23/2013  . CAD in native artery 12/01/2013  . Hypophonia 12/10/2012  . Sialorrhea 12/10/2012  . Diverticulosis 09/26/2006   PAST MEDICAL HISTORY:  Active Ambulatory Problems    Diagnosis Date Noted  . Leg pain 01/02/2017  . Parkinson's disease ([place]) 01/02/2017  . PAD (peripheral artery disease) ([place]) 08/24/2019  . GERD (gastroesophageal reflux disease) 08/24/2019  . DJD (degenerative joint disease) 08/24/2019  . Acute constipation 11/18/2015  . Arthropathy of lumbar facet joint 11/01/2016  . Benign essential HTN 05/01/2016  . Bursitis of hip 06/14/2015  . CAD in native artery 12/01/2013  . Chronic ulcerative proctitis, without complications ([place]) 37/62/8315  . Contusion of lower leg 12/13/2016  . Diverticulosis 09/26/2006  . Elevated alkaline phosphatase level 06/26/2018  . Fall at home, subsequent encounter 05/01/2016  . Gastritis and duodenitis 08/25/2019  . Hyperlipidemia, mixed 12/23/2013  . Hypophonia 12/10/2012  . Impingement syndrome of shoulder region 06/14/2015  . Intercostal pain 05/01/2016  . Paroxysmal supraventricular tachycardia ([place]) 11/04/2015  . Rotator cuff tear arthropathy 10/01/2018  . Sialorrhea 12/10/2012  . Strain of rotator cuff capsule 10/01/2018  . Pain and swelling of lower leg 08/31/2019   Resolved Ambulatory Problems    Diagnosis Date Noted  . No Resolved Ambulatory Problems   Past Medical History:  Diagnosis Date  . Arthritis   . Bursitis   . CAD (coronary artery disease)   . Hyperlipidemia   . Hypertension   . Parkinson disease ([place])   . Ulcerative colitis ([place])    SOCIAL HX:  Social History   Tobacco Use  . Smoking status: Never Smoker  . Smokeless tobacco: Never Used  Substance Use Topics  . Alcohol use: Yes    Comment: occasional   FAMILY HX:  Family History  Problem Relation Age of Onset  . Breast cancer Maternal Aunt   . Breast cancer Paternal Grandmother        ALLERGIES:  Allergies  Allergen Reactions  . Cefuroxime Axetil Rash  . Carafate [Sucralfate]   . Levaquin [Levofloxacin In D5w]   . Penicillins   . Septra [Sulfamethoxazole-Trimethoprim]   . Thorazine [Chlorpromazine] Other (See Comments)  . Cefuroxime Rash and Other (See Comments)  . Levaquin [Levofloxacin] Other (See Comments)     PERTINENT MEDICATIONS:  Outpatient Encounter Medications as of 04/27/2020  Medication Sig  . acetaminophen (TYLENOL 8 HOUR) 650 MG CR tablet Take by mouth.  . Biotin 2.5 MG TABS Take by mouth.  . carbidopa-levodopa (SINEMET IR) 25-100 MG tablet 1 tablet 4 (four) times daily. And 0.5 mg twice daily  . clonazePAM (KLONOPIN) 0.5 MG tablet Take 0.5 mg by mouth at bedtime.   Mariane Baumgarten Sodium (DSS) 100 MG CAPS Take by mouth.  . lactobacillus acidophilus (BACID) TABS tablet Take 2 tablets by mouth 3 (three) times daily.  . Mesalamine (DELZICOL) 400 MG CPDR DR capsule 2 (two) times daily.  . Mesalamine (DELZICOL) 400 MG CPDR DR capsule delzicol 400 mg cpdr  . mirabegron ER (MYRBETRIQ) 25 MG TB24 tablet  myrbetriq 25 mg tb24  . omeprazole (PRILOSEC) 10 MG capsule Take 10 mg by mouth daily.  Marland Kitchen rOPINIRole (REQUIP) 1 MG tablet Take by mouth.  . traZODone (DESYREL) 50 MG tablet Take 100 mg by mouth at bedtime.  . Vitamin D, Ergocalciferol, (DRISDOL) 50000 units CAPS capsule vitamin d 50000 unit caps  . [DISCONTINUED] cetirizine (ZYRTEC) 10 MG tablet Take by mouth.  . [DISCONTINUED] famotidine (PEPCID) 20 MG tablet Take 20 mg by mouth daily as needed for heartburn or indigestion.   . [DISCONTINUED] ranitidine (ZANTAC) 150 MG tablet ranitidine hcl 150 mg tabs  . [DISCONTINUED] sertraline (ZOLOFT) 25 MG tablet sertraline 25 mg tablet   No facility-administered encounter medications on file as of 04/27/2020.     Objective: ROS  General: NAD EYES: denies vision changes, wears glasses ENMT: denies dysphagia, hypophonic voice Cardiovascular: denies chest  pain Pulmonary: denies cough, denies increased SOB Abdomen: endorses good appetite, endorses occ  constipation, endorses continence of bowel GU: denies dysuria, endorses continence of urine MSK:  endorses ROM limitations, Frequent assisted falls/intentionally lowers herself to her knees without injury  Skin: denies rashes or wounds Neurological: endorses weakness, denies pain, denies insomnia Psych: Endorses positive mood Heme/lymph/immuno: denies bruises, abnormal bleeding  Physical Exam: Current and past weights: no recent weights available, appears stable around 105 lbs Constitutional:  NAD General: frail appearing, thin EYES: anicteric sclera, lids intact, no discharge  ENMT: intact hearing,oral mucous membranes moist, dentition intact CV: no LE edema Pulmonary: no increased work of breathing, no cough, no audible wheezes, room air Abdomen: intake 75-100%, no ascites GU: deferred MSK: moderate sarcopenia, decreased ROM in all extremities, no contractures of LE, non ambulatory, can pivot transfer Skin: warm and dry, no rashes or wounds on visible skin Neuro: Generalized weakness, mild cognitive impairment, dyskinesias increased Psych: non-anxious affect, A and O x 2-3 Hem/lymph/immuno: no widespread bruising  Thank you for the opportunity to participate in the care of Ms. Ferrer.  The palliative care team will continue to follow. Please call our office at [phone] if we can be of additional assistance.  Jason Coop, NP , DNP, MPH, AGPCNP-BC, ACHPN   COVID-19 PATIENT SCREENING TOOL  Person answering questions: _______Patient/family_________   1.  Is the patient or any family member in the home showing any signs or symptoms regarding respiratory infection?                  Person with Symptom  ______________na___________ a. Fever/chills/headache                                                        Yes___ No__X_            b. Shortness of breath                                                             Yes___ No__X_           c. Cough/congestion  Yes___  No__X_          d. Muscle/Body aches/pains                                                   Yes___ No__X_         e. Gastrointestinal symptoms (diarrhea,nausea)             Yes___ No__X_         f. Sudden loss of smell or taste      Yes___ No__X_        2. Within the past 10 days, has anyone living in the home had any contact with someone with or under investigation for COVID-19?    Yes___ No__X__   Person __________________

## 2020-06-08 ENCOUNTER — Other Ambulatory Visit: Payer: Medicare PPO | Admitting: Primary Care

## 2020-06-08 ENCOUNTER — Other Ambulatory Visit: Payer: Self-pay

## 2020-06-08 DIAGNOSIS — F5101 Primary insomnia: Secondary | ICD-10-CM

## 2020-06-08 DIAGNOSIS — Z515 Encounter for palliative care: Secondary | ICD-10-CM

## 2020-06-08 DIAGNOSIS — G20 Parkinson's disease: Secondary | ICD-10-CM

## 2020-06-08 DIAGNOSIS — G20A1 Parkinson's disease without dyskinesia, without mention of fluctuations: Secondary | ICD-10-CM
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
## 2020-06-30 ENCOUNTER — Other Ambulatory Visit: Payer: Self-pay

## 2020-06-30 ENCOUNTER — Other Ambulatory Visit: Payer: Medicare PPO | Admitting: Primary Care

## 2020-06-30 DIAGNOSIS — F5101 Primary insomnia: Secondary | ICD-10-CM

## 2020-06-30 DIAGNOSIS — Z515 Encounter for palliative care: Secondary | ICD-10-CM

## 2020-06-30 DIAGNOSIS — G20 Parkinson's disease: Secondary | ICD-10-CM

## 2020-06-30 DIAGNOSIS — G20A1 Parkinson's disease without dyskinesia, without mention of fluctuations: Secondary | ICD-10-CM

## 2020-06-30 NOTE — Progress Notes (Addendum)
[facility] Consult Note Telephone: [phone]  Fax: [phone]    Date of encounter: 06/30/20 PATIENT NAME: Kelsey Owens [address] [facility] [address]   [phone] (home)  DOB: 1940/09/25 MRN: [id number] PRIMARY CARE PROVIDER:    Albina Billet, MD,  [address]   [place] [address] [phone]  REFERRING PROVIDER:   Albina Billet, MD [address]   [place],  [place] [address] [place], [place], [place], [place] [address]  Phone: [phone]  Fax: [phone]   RESPONSIBLE PARTY:    Contact Information    Name Relation Home Work [place] Daughter   [phone]   [place], [place] Daughter   [phone]      I met face to face with patient and family in her home. Palliative Care was asked to follow this patient by consultation request of  Albina Billet, MD to address advance care planning and complex medical decision making. This is a follow up visit.                                   ASSESSMENT AND PLAN / RECOMMENDATIONS:   Advance Care Planning/Goals of Care: Goals include to maximize quality of life and symptom management. Our advance care planning conversation included a discussion about:     Exploration of personal, cultural or spiritual beliefs that might influence medical decisions   Review  of an  advance directive document.  CODE STATUS: DNR  Goals of care include sx management for QOL.  Symptom Management/Plan:  Anxiety and Insomnia; On clonazepam 0.25 mg, at hs, which has been a wean that has not resulted in good hs rest. Caregivers endorse she's up more at hs, trying to walk. Family endorse increased anxiety and failure of wean. I will accept prescribing for her clonazepam if desired, as pt has been on this 20 years. I have checked PMPAware and sent prescription for clonazepam 0.5 mg po nightly , #30, 1 refill. DIscussed f/u  appts by TM if needed. Family will lock up meds and dispense 1 week at a time for patient's use per hired caregivers.  Pt will have procedure on 5/31 to replace her deep brain stim battery. Perhaps we can revisit wean once her baseline is improved with a more functional device. This is TBD. Family also endorses anxiety over poor caregiver situations as well as covid 63 and death of pt husband 18 months ago.   Sertraline is at 50 mg with no adverse reactions, but  no appreciable effect. Recommend increase to 75 mg. They will begin this trial and I can increase prescription if needed.  Constipation: Endorses. I have recommended prn miralax, and for caregivers to track and give laxative if not bm qod. Apparently she's been going 5 days or more with out bm. Constipation could be contributing to anxiety as well.  Follow up Palliative Care Visit: Palliative care will continue to follow for complex medical decision making, advance care planning, and clarification of goals. Return 8 weeks or prn.  I spent 60 minutes providing this consultation. More than 50% of the time in this consultation was spent in counseling and care coordination.  PPS: 40%  HOSPICE ELIGIBILITY/DIAGNOSIS: TBD  Chief Complaint: insomnia  HISTORY OF PRESENT ILLNESS:  Kelsey Owens is a 80 y.o. year  old female  with PD,constipation, insomnia, anxiety .   History obtained from review of EMR, discussion with primary team, and interview with family, facility staff/caregiver and/or Ms. Hackleman.   ROS General: NAD EYES: denies vision changes, has glasses ENMT: denies dysphagia Cardiovascular: denies chest pain, denies DOE Pulmonary: denies cough, denies increased SOB Abdomen: endorses good appetite, occ constipation, endorses continence of bowel GU: denies dysuria, endorses continence of urine MSK:   Endorses weakness,  + falls reported, crawls on floor for ambulation Skin: denies rashes or wounds Neurological: denies  pain, endorses insomnia Psych: Endorses positive mood, some hs anxiety Heme/lymph/immuno: denies bruises, abnormal bleeding  Physical Exam: Current and past weights: 111 lbs estimated Constitutional: NAD General: frail appearing, thin EYES: anicteric sclera, lids intact, no discharge  ENMT: intact hearing, oral mucous membranes moist, dentition intact CV: no LE edema Pulmonary:  no increased work of breathing, no cough, room air Abdomen: intake 100%, no ascites MSK: ++ sarcopenia, moves all extremities,  Ambulatory with help Skin: warm and dry, no rashes or wounds on visible skin Neuro:  Some generalized weakness,  + cognitive impairment, dyskinesias Psych: anxious affect, A and O x 2, day and hs  agitation Hem/lymph/immuno: no widespread bruising   Thank you for the opportunity to participate in the care of Kelsey Owens.  The palliative care team will continue to follow. Please call our office at [phone] if we can be of additional assistance.   Jason Coop, NP , DNP, MPH, AGPCNP-BC, ACHPN  COVID-19 PATIENT SCREENING TOOL Asked and negative response unless otherwise noted:   Have you had symptoms of covid, tested positive or been in contact with someone with symptoms/positive test in the past 5-10 days?

## 2020-07-02 ENCOUNTER — Ambulatory Visit
Admission: RE | Admit: 2020-07-02 | Discharge: 2020-07-02 | Disposition: A | Discharge status: Home / Self Care | Payer: Medicare PPO | Source: Ambulatory Visit | Attending: Internal Medicine | Admitting: Internal Medicine

## 2020-07-02 ENCOUNTER — Other Ambulatory Visit: Payer: Self-pay

## 2020-07-02 DIAGNOSIS — Z01818 Encounter for other preprocedural examination: Secondary | ICD-10-CM | POA: Diagnosis not present

## 2020-08-18 ENCOUNTER — Telehealth: Payer: Self-pay | Admitting: Primary Care

## 2020-08-18 NOTE — Telephone Encounter (Signed)
T/c to Patient no answer. Message left to reschedule 08/24/20 due to provider not available. T/c to [place], no ability to leave message. Called Morey Hummingbird, another daughter and left message last week but no reply. Appt will be cancelled and needs to be rescheduled for medication management.

## 2020-08-24 ENCOUNTER — Other Ambulatory Visit: Payer: Medicare PPO | Admitting: Primary Care

## 2020-08-26 ENCOUNTER — Other Ambulatory Visit: Payer: Medicare PPO | Admitting: Primary Care

## 2020-08-26 ENCOUNTER — Other Ambulatory Visit: Payer: Self-pay

## 2020-08-26 DIAGNOSIS — W19XXXD Unspecified fall, subsequent encounter: Secondary | ICD-10-CM

## 2020-08-26 DIAGNOSIS — Z515 Encounter for palliative care: Secondary | ICD-10-CM

## 2020-08-26 DIAGNOSIS — G20A1 Parkinson's disease without dyskinesia, without mention of fluctuations: Secondary | ICD-10-CM

## 2020-08-26 DIAGNOSIS — G20 Parkinson's disease: Secondary | ICD-10-CM

## 2020-08-26 DIAGNOSIS — Z8719 Personal history of other diseases of the digestive system: Secondary | ICD-10-CM
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
## 2020-09-23 ENCOUNTER — Other Ambulatory Visit: Payer: Self-pay

## 2020-09-23 ENCOUNTER — Other Ambulatory Visit: Payer: Medicare PPO | Admitting: Primary Care

## 2020-09-23 DIAGNOSIS — K5120 Ulcerative (chronic) proctitis without complications: Secondary | ICD-10-CM

## 2020-09-23 DIAGNOSIS — G20 Parkinson's disease: Secondary | ICD-10-CM

## 2020-09-23 DIAGNOSIS — Z515 Encounter for palliative care: Secondary | ICD-10-CM

## 2020-09-23 DIAGNOSIS — G20A1 Parkinson's disease without dyskinesia, without mention of fluctuations: Secondary | ICD-10-CM

## 2020-09-23 NOTE — Progress Notes (Signed)
[facility] Consult Note Telephone: [phone]  Fax: [phone]    Date of encounter: 09/23/20 PATIENT NAME: Kelsey Owens [address] Kelsey [facility] [address]   [phone] (home)  DOB: [birth year] MRN: [id number] PRIMARY CARE PROVIDER:    Albina Billet, MD,  [address]   [place] [address] [phone]  REFERRING PROVIDER:   Albina Billet, MD [address]   [place],   [address] [phone]  RESPONSIBLE PARTY:    Contact Information     Name Relation Home Work Mobile   Kelsey Owens Daughter   [phone]   Kelsey Owens, Kelsey Owens Daughter   [phone]       I met face to face with patient and family in  home. Palliative Care was asked to follow this patient by consultation request of  Kelsey Billet, MD to address advance care planning and complex medical decision making. This is a follow up visit.         ASSESSMENT AND PLAN / RECOMMENDATIONS:   Advance Care Planning/Goals of Care: Goals include to maximize quality of life and symptom management. Our advance care planning conversation included a discussion about:    The value and importance of advance care planning  Exploration of personal, cultural or spiritual beliefs that might CODE STATUS: DNR Family working towards placement in ALF.  Symptom Management/Plan:  Patient is planning move to ALF. We discussed her choosing some items to take with her. Family has kept her at home almost 2 years since her husband died in 12/21/2022. He had been her caregiver. They have had in home assistance with varying success and feel ALF will be a good choice.   I will do FL2 today and send per fax to [facility].   I have read TB test today, negative on L FA at 1230 pm. [phone] Fax for [facility], Attn Kelsey Owens   Continues to use clonazepam. I sent in  clonazepam 0.5 mg po 1/2 tab in the day time and 1 tab at hs,  #45 with 2 refills.  Endorses constipation  and begins miralax on day 3 or 4 of no bm. I have advised them to mix dose of miralax and give a quarter to half a dose daily to maintain bowel regimen.   Follow up Palliative Care Visit: Palliative care will continue to follow for complex medical decision making, advance care planning, and clarification of goals. Return 5 weeks or prn.  I spent 60 minutes providing this consultation. More than 50% of the time in this consultation was spent in counseling and care coordination.  PPS: 40%  HOSPICE ELIGIBILITY/DIAGNOSIS: TBD  Chief Complaint: immobility  HISTORY OF PRESENT ILLNESS:  Kelsey Owens is a 80 y.o. year old female  with Parkinson's, debility, frequent falls.   History obtained from review of EMR, discussion with primary team, and interview with family, facility staff/caregiver and/or Kelsey Owens.  I reviewed available labs, medications, imaging, studies and related documents from the EMR.  Records reviewed and summarized above.   ROS   General: NAD EYES: denies vision changes, has glasses ENMT: denies dysphagia Cardiovascular: denies chest pain, denies DOE Pulmonary: denies cough, denies increased SOB Abdomen: endorses good appetite, endorses  constipation, endorses continence of bowel GU: denies dysuria, endorses continence of urine MSK:  endorses weakness,  occ falls reported Skin: denies rashes or wounds Neurological: denies pain, denies insomnia, PD tremors Psych: Endorses positive mood Heme/lymph/immuno:  denies bruises, abnormal bleeding  Physical Exam: Current and past weights:  112 lbs Constitutional: NAD, 135/69  HR= 94,  RR 18 General: frail appearing, thin EYES: anicteric sclera, lids intact, no discharge  ENMT: intact hearing, oral mucous membranes moist, dentition intact CV:  RRR, no LE edema Pulmonary: no increased work of breathing, no cough, room air Abdomen: intake 50%,  no ascites GU: deferred MSK: ++ sarcopenia, moves all extremities,  non  ambulatory Skin: warm and dry, no rashes or wounds on visible skin Neuro:  + generalized weakness,  mild cognitive impairment Psych: non-anxious affect, A and O x 2-3 Hem/lymph/immuno: no widespread bruising  Patient Active Problem List   Diagnosis Date Noted   Pain and swelling of lower leg 08/31/2019   Gastritis and duodenitis 08/25/2019   PAD (peripheral artery disease) ([place]) 08/24/2019   GERD (gastroesophageal reflux disease) 08/24/2019   DJD (degenerative joint disease) 08/24/2019   Rotator cuff tear arthropathy 10/01/2018   Strain of rotator cuff capsule 10/01/2018   Chronic ulcerative proctitis, without complications ([place]) 90/30/0923   Elevated alkaline phosphatase level 06/26/2018   Leg pain 01/02/2017   Parkinson's disease ([place]) 01/02/2017   Contusion of lower leg 12/13/2016   Arthropathy of lumbar facet joint 11/01/2016   Benign essential HTN 05/01/2016   Fall at home, subsequent encounter 05/01/2016   Intercostal pain 05/01/2016   Acute constipation 11/18/2015   Paroxysmal supraventricular tachycardia ([place]) 11/04/2015   Bursitis of hip 06/14/2015   Impingement syndrome of shoulder region 06/14/2015   Hyperlipidemia, mixed 12/23/2013   CAD in native artery 12/01/2013   Hypophonia 12/10/2012   Sialorrhea 12/10/2012   Diverticulosis 09/26/2006   Active Ambulatory Problems    Diagnosis Date Noted   Leg pain 01/02/2017   Parkinson's disease ([place]) 01/02/2017   PAD (peripheral artery disease) ([place]) 08/24/2019   GERD (gastroesophageal reflux disease) 08/24/2019   DJD (degenerative joint disease) 08/24/2019   Acute constipation 11/18/2015   Arthropathy of lumbar facet joint 11/01/2016   Benign essential HTN 05/01/2016   Bursitis of hip 06/14/2015   CAD in native artery 12/01/2013   Chronic ulcerative proctitis, without complications ([place]) 30/08/6224   Contusion of lower leg 12/13/2016   Diverticulosis 09/26/2006   Elevated alkaline phosphatase level 06/26/2018    Fall at home, subsequent encounter 05/01/2016   Gastritis and duodenitis 08/25/2019   Hyperlipidemia, mixed 12/23/2013   Hypophonia 12/10/2012   Impingement syndrome of shoulder region 06/14/2015   Intercostal pain 05/01/2016   Paroxysmal supraventricular tachycardia ([facility]) 11/04/2015   Rotator cuff tear arthropathy 10/01/2018   Sialorrhea 12/10/2012   Strain of rotator cuff capsule 10/01/2018   Pain and swelling of lower leg 08/31/2019   Resolved Ambulatory Problems    Diagnosis Date Noted   No Resolved Ambulatory Problems   Past Medical History:  Diagnosis Date   Arthritis    Bursitis    CAD (coronary artery disease)    Hyperlipidemia    Hypertension    Parkinson disease ([place])    Ulcerative colitis ([place])    Outpatient Encounter Medications as of 09/23/2020  Medication Sig   acetaminophen (TYLENOL) 650 MG CR tablet Take 650 mg by mouth every 8 (eight) hours as needed for pain.   Biotin 2.5 MG TABS Take 2.5 mg by mouth daily.   carbidopa-levodopa (SINEMET IR) 25-100 MG tablet 1 tablet 4 (four) times daily. 1 tablet 8 am, 11am, 2pm  Half tablet at 5pm and 8pm   clonazePAM (KLONOPIN) 0.5 MG tablet Take 0.25 mg by  mouth daily. In the morning.   clonazePAM (KLONOPIN) 0.5 MG tablet Take 0.5 mg by mouth at bedtime.   Docusate Sodium (DSS) 100 MG CAPS Take 1 capsule by mouth daily.   lactobacillus acidophilus (BACID) TABS tablet Take 2 tablets by mouth 3 (three) times daily.   mirabegron ER (MYRBETRIQ) 25 MG TB24 tablet Take 25 mg by mouth daily.   omeprazole (PRILOSEC) 10 MG capsule Take 10 mg by mouth daily.   rOPINIRole (REQUIP) 1 MG tablet Take 1 mg by mouth at bedtime.   sertraline (ZOLOFT) 25 MG tablet Take 75 mg by mouth daily.   traZODone (DESYREL) 50 MG tablet Take 100 mg by mouth at bedtime.   Vitamin D, Ergocalciferol, (DRISDOL) 50000 units CAPS capsule Take 50,000 Units by mouth every 7 (seven) days.   Mesalamine (ASACOL) 400 MG CPDR DR capsule 2 (two) times daily.  (Patient not taking: Reported on 09/23/2020)   [DISCONTINUED] cetirizine (ZYRTEC) 10 MG tablet Take by mouth.   [DISCONTINUED] clonazePAM (KLONOPIN) 0.5 MG tablet Take 0.5 mg by mouth at bedtime. Wean to 0.25 mg over 3 weeks.   [DISCONTINUED] famotidine (PEPCID) 20 MG tablet Take 20 mg by mouth daily as needed for heartburn or indigestion.    [DISCONTINUED] ranitidine (ZANTAC) 150 MG tablet ranitidine hcl 150 mg tabs   No facility-administered encounter medications on file as of 09/23/2020.     Thank you for the opportunity to participate in the care of Ms. Moragne.  The palliative care team will continue to follow. Please call our office at [phone] if we can be of additional assistance.   Jason Coop, NP   COVID-19 PATIENT SCREENING TOOL Asked and negative response unless otherwise noted:   Have you had symptoms of covid, tested positive or been in contact with someone with symptoms/positive test in the past 5-10 days?

## 2020-10-25 ENCOUNTER — Other Ambulatory Visit: Payer: Medicare PPO | Admitting: Primary Care

## 2020-10-29 ENCOUNTER — Other Ambulatory Visit: Payer: Self-pay

## 2020-10-29 ENCOUNTER — Other Ambulatory Visit: Payer: Medicare PPO | Admitting: Primary Care

## 2020-10-29 DIAGNOSIS — G20 Parkinson's disease: Secondary | ICD-10-CM

## 2020-10-29 DIAGNOSIS — G20A1 Parkinson's disease without dyskinesia, without mention of fluctuations: Secondary | ICD-10-CM

## 2020-10-29 DIAGNOSIS — Y92009 Unspecified place in unspecified non-institutional (private) residence as the place of occurrence of the external cause: Secondary | ICD-10-CM

## 2020-10-29 DIAGNOSIS — Z515 Encounter for palliative care: Secondary | ICD-10-CM

## 2020-10-29 DIAGNOSIS — W19XXXD Unspecified fall, subsequent encounter: Secondary | ICD-10-CM

## 2020-10-29 DIAGNOSIS — F5101 Primary insomnia: Secondary | ICD-10-CM

## 2020-10-29 NOTE — Progress Notes (Signed)
[facility] Consult Note Telephone: [phone]  Fax: [phone]   Due to the COVID-19 crisis, this visit was done via telemedicine from my office and it was initiated and consent by this patient and or family.  I connected with  Kelsey Owens on 10/29/20 by a video enabled telemedicine application and verified that I am speaking with the correct person using two identifiers.   I discussed the limitations of evaluation and management by telemedicine. The patient expressed understanding and agreed to proceed.  Date of encounter: 10/29/20 1:04 PM PATIENT NAME: Kelsey Owens [address] [place] [facility] [address]   [phone] (home)  DOB: 05/22/1940 MRN: [id number] PRIMARY CARE PROVIDER:    Albina Billet, MD,  [address]   [place] [address] [phone]  REFERRING PROVIDER:   Albina Billet, MD [address]   [facility] [facility],   [address] [phone]  RESPONSIBLE PARTY:    Contact Information     Name Relation Home Work Mobile   [place] Daughter   [phone]   Kelsey Owens   [phone]       I met with family face to face by video, in their home. Palliative Care was asked to follow this patient by consultation request of  Albina Billet, MD to address advance care planning and complex medical decision making. This is a follow up visit.                                   ASSESSMENT AND PLAN / RECOMMENDATIONS:   Advance Care Planning/Goals of Care: Goals include to maximize quality of life and symptom management. Our advance care planning conversation included a discussion about:    Exploration of personal, cultural or spiritual beliefs that might influence medical decisions  Exploration of goals of care in the event of a sudden injury or illness  Identification  of a healthcare agent - 3 adult children share oversight. Review of an  advance directive document . CODE STATUS: DNR on  file in home,  need to take documents with her  to ALF.  Symptom Management/Plan:  Diarrhea: Has been present a few days, recommend otc anti diarrheal if needed. Appears to be increasing incontinence.Have reduced miralax to smaller amounts has helped with this.  PD symptoms: Endorses no falls. Appetite good, intake good, no subjective weight loss. Voice has become softer to now she is only able to whisper.  Insomnia, agitation: Clonazepam currently taken at afternoon and hs. Will continue 0.25 mg at 2 pm and 0.5 mg at hs. #25  NRF, sent to [facility]. ALF PCP to f/u with prescribing.   Medication management: Reviewed medications and discuss de prescribing. May continue dietary supplements if desired.  Follow up Palliative Care Visit: Palliative care will continue to follow for complex medical decision making, advance care planning, and clarification of goals. Return 6 weeks or prn.  I spent 40 minutes providing this consultation. More than 50% of the time in this consultation was spent in counseling and care coordination.  PPS: 30%  HOSPICE ELIGIBILITY/DIAGNOSIS: TBD  Chief Complaint: insomnia  HISTORY OF PRESENT ILLNESS:  Kelsey Owens is a 80 y.o. year old female  with insomnia, PD, debility and immobility .   History obtained from review of EMR, discussion with primary team, and interview with family, facility staff/caregiver and/or [name].  I reviewed  available labs, medications, imaging, studies and related documents from the EMR.  Records reviewed and summarized above.   ROS   General: NAD Pulmonary: denies cough, denies increased SOB Abdomen: endorses good appetite, denies constipation, endorses  some in continence of bowel GU: denies dysuria, endorses continence of urine MSK:  endorses weakness,  no falls reported Skin: denies rashes or wounds Neurological: denies pain, denies insomnia Psych: Endorses positive mood Heme/lymph/immuno: denies bruises,  abnormal bleeding  Physical Exam: Current and past weights: stable  Constitutional: NAD General: frail appearing, thin EYES: anicteric sclera, lids intact, no discharge  ENMT: intact hearing, oral mucous membranes moist, dentition intact Pulmonary: no increased work of breathing, no cough, room air MSK: + sarcopenia,   non ambulatory Skin: warm and dry, no rashes or wounds on visible skin Neuro:  + generalized weakness,  + cognitive impairment Psych: non-anxious affect, A and O x 3 Hem/lymph/immuno: no widespread bruising   Thank you for the opportunity to participate in the care of [name].  The palliative care team will continue to follow. Please call our office at [phone] if we can be of additional assistance.   Jason Coop, NP   COVID-19 PATIENT SCREENING TOOL Asked and negative response unless otherwise noted:   Have you had symptoms of covid, tested positive or been in contact with someone with symptoms/positive test in the past 5-10 days?

## 2020-11-12 ENCOUNTER — Other Ambulatory Visit: Payer: Self-pay

## 2020-11-12 ENCOUNTER — Non-Acute Institutional Stay: Payer: Medicare PPO | Admitting: Student

## 2020-11-12 DIAGNOSIS — R451 Restlessness and agitation: Secondary | ICD-10-CM

## 2020-11-12 DIAGNOSIS — Z515 Encounter for palliative care: Secondary | ICD-10-CM

## 2020-11-12 DIAGNOSIS — F5101 Primary insomnia: Secondary | ICD-10-CM

## 2020-11-12 DIAGNOSIS — G20 Parkinson's disease: Secondary | ICD-10-CM

## 2020-11-22 NOTE — Progress Notes (Signed)
[facility] Consult Note Telephone: [phone]  Fax: [phone]    Date of encounter: 11/12/2020  PATIENT NAME: Kelsey Owens [address] [facility] [address]   [phone] (home)  DOB: 1940-08-04 MRN: [id number] PRIMARY CARE PROVIDER:    Albina Billet, MD,  [address]   [place] [address] [phone]  REFERRING PROVIDER:   Albina Billet, MD [address]   [place],  [place] [address] [phone]  RESPONSIBLE PARTY:    Contact Information     Name Relation Home Work Mobile   [place] Daughter   [phone]   Clyde Lundborg   [phone]        I met face to face with patient and family in the facility. Palliative Care was asked to follow this patient by consultation request of  Albina Billet, MD to address advance care planning and complex medical decision making. This is a follow up visit.                                   ASSESSMENT AND PLAN / RECOMMENDATIONS:   Advance Care Planning/Goals of Care: Goals include to maximize quality of life and symptom management.   CODE STATUS: DNR  Symptom Management/Plan:  Parkinson's Disease-patient recently transitioned to ALF. She appears to be doing well with transition. Staff to assist with adl's. Monitor for falls/safety. Appetite is good; monitor intake.   Insomnia, agitation-continue clonazepam twice daily as directed. Monitor for effectiveness.   Follow up Palliative Care Visit: Palliative care will continue to follow for complex medical decision making, advance care planning, and clarification of goals. Return in 6-8 weeks or prn.  I spent 25 minutes providing this consultation. More than 50% of the time in this consultation was spent in counseling and care coordination.   PPS: 30%  HOSPICE ELIGIBILITY/DIAGNOSIS: TBD  Chief Complaint: Palliative Medicine follow up visit.   HISTORY OF PRESENT ILLNESS:  Kelsey Owens  is a 80 y.o. year old female  with Parkinson's disease, DJD, CAD, PAD, GERD.   Patient recently moved to [facility] assisted living from home in the community.  She states that she has been doing well. Her voice is low in tone; she is able to convey her needs.  She is able to answer questions. Patient denies pain, although she does endorse some tenderness to her left side, near her breast. She denies any recent falls. Staff does report that patient has been found to crawl on the floor. No shortness of breath, nausea, constipation. She requires assistance with all adl's. A 10-point review of systems is negative, except for the pertinent positives and negatives detailed in the HPI.    History obtained from review of EMR, discussion with primary team, and interview with family, facility staff/caregiver and/or Ms. Kassner.  I reviewed available labs, medications, imaging, studies and related documents from the EMR.  Records reviewed and summarized above.     Physical Exam: Constitutional: NAD General: frail appearing, thin EYES: anicteric sclera, lids intact, no discharge  ENMT: intact hearing, oral mucous membranes moist, dentition intact CV: S1S2, RRR, no LE edema Pulmonary: LCTA, no increased work of breathing, no cough, room air Abdomen: normo-active BS + 4 quadrants, soft and non tender GU: deferred MSK: moves all extremities, non-ambulatory, mild tenderness to left lateral ribs.  Skin: warm and dry, no rashes or wounds  on visible skin Neuro: generalized weakness, slower to respond Psych: non-anxious affect, A and O x 3 Hem/lymph/immuno: no widespread bruising   Thank you for the opportunity to participate in the care of Ms. Urschel.  The palliative care team will continue to follow. Please call our office at [phone] if we can be of additional assistance.   Ezekiel Slocumb, NP   COVID-19 PATIENT SCREENING TOOL Asked and negative response unless otherwise noted:   Have you had  symptoms of covid, tested positive or been in contact with someone with symptoms/positive test in the past 5-10 days? No

## 2020-11-26 ENCOUNTER — Emergency Department: Payer: Medicare PPO

## 2020-11-26 ENCOUNTER — Other Ambulatory Visit: Payer: Self-pay

## 2020-11-26 ENCOUNTER — Emergency Department
Admission: EM | Admit: 2020-11-26 | Discharge: 2020-11-26 | Disposition: A | Discharge status: Home / Self Care | Payer: Medicare PPO | Attending: Emergency Medicine | Admitting: Emergency Medicine

## 2020-11-26 DIAGNOSIS — W050XXA Fall from non-moving wheelchair, initial encounter: Secondary | ICD-10-CM | POA: Insufficient documentation

## 2020-11-26 DIAGNOSIS — I10 Essential (primary) hypertension: Secondary | ICD-10-CM | POA: Insufficient documentation

## 2020-11-26 DIAGNOSIS — W19XXXA Unspecified fall, initial encounter: Secondary | ICD-10-CM

## 2020-11-26 DIAGNOSIS — I2510 Atherosclerotic heart disease of native coronary artery without angina pectoris: Secondary | ICD-10-CM | POA: Insufficient documentation

## 2020-11-26 DIAGNOSIS — S0990XA Unspecified injury of head, initial encounter: Secondary | ICD-10-CM | POA: Insufficient documentation

## 2020-11-26 LAB — BASIC METABOLIC PANEL
Anion gap: 9 (ref 5–15)
BUN: 21 mg/dL (ref 8–23)
CO2: 26 mmol/L (ref 22–32)
Calcium: 8.8 mg/dL — ABNORMAL LOW (ref 8.9–10.3)
Chloride: 103 mmol/L (ref 98–111)
Creatinine, Ser: 0.93 mg/dL (ref 0.44–1.00)
GFR, Estimated: >60 mL/min (ref >60)
Glucose, Bld: 102 mg/dL — ABNORMAL HIGH (ref 70–99)
Potassium: 4.2 mmol/L (ref 3.5–5.1)
Sodium: 138 mmol/L (ref 135–145)

## 2020-11-26 LAB — CBC WITH DIFFERENTIAL/PLATELET
Abs Immature Granulocytes: 0.02 10*3/uL (ref 0.00–0.07)
Basophils Absolute: 0.1 10*3/uL (ref 0.0–0.1)
Basophils Relative: 1 %
Eosinophils Absolute: 0.1 10*3/uL (ref 0.0–0.5)
Eosinophils Relative: 1 %
HCT: 37.2 % (ref 36.0–46.0)
Hemoglobin: 12.5 g/dL (ref 12.0–15.0)
Immature Granulocytes: 0 %
Lymphocytes Relative: 11 %
Lymphs Abs: 0.8 10*3/uL (ref 0.7–4.0)
MCH: 30.9 pg (ref 26.0–34.0)
MCHC: 33.6 g/dL (ref 30.0–36.0)
MCV: 91.9 fL (ref 80.0–100.0)
Monocytes Absolute: 0.5 10*3/uL (ref 0.1–1.0)
Monocytes Relative: 7 %
Neutro Abs: 6.1 10*3/uL (ref 1.7–7.7)
Neutrophils Relative %: 80 %
Platelets: 197 10*3/uL (ref 150–400)
RBC: 4.05 MIL/uL (ref 3.87–5.11)
RDW: 12.9 % (ref 11.5–15.5)
WBC: 7.5 10*3/uL (ref 4.0–10.5)
nRBC: 0 % (ref 0.0–0.2)

## 2020-11-26 NOTE — ED Provider Notes (Signed)
[facility] Emergency Department Provider Note   ____________________________________________    I have reviewed the triage vital signs and the nursing notes.   HISTORY  Chief Complaint Fall   History limited by dementia  HPI Kelsey Owens is a 80 y.o. female who presents after a fall.  Patient reportedly was getting out of her wheelchair, fell and instructed right side of her head.  EMS reports shortening of right hip/leg.  Patient overall complains only of mild right-sided head pain  Past Medical History:  Diagnosis Date   Arthritis    Bursitis    CAD (coronary artery disease)    GERD (gastroesophageal reflux disease)    Hyperlipidemia    Hypertension    Hypophonia    Intercostal pain    Parkinson disease (HCC)    Paroxysmal supraventricular tachycardia (HCC)    Ulcerative colitis ([place])     Patient Active Problem List   Diagnosis Date Noted   Pain and swelling of lower leg 08/31/2019   Gastritis and duodenitis 08/25/2019   PAD (peripheral artery disease) ([place]) 08/24/2019   GERD (gastroesophageal reflux disease) 08/24/2019   DJD (degenerative joint disease) 08/24/2019   Rotator cuff tear arthropathy 10/01/2018   Strain of rotator cuff capsule 10/01/2018   Chronic ulcerative proctitis, without complications ([place]) 74/25/9563   Elevated alkaline phosphatase level 06/26/2018   Leg pain 01/02/2017   Parkinson's disease ([place]) 01/02/2017   Contusion of lower leg 12/13/2016   Arthropathy of lumbar facet joint 11/01/2016   Benign essential HTN 05/01/2016   Fall at home, subsequent encounter 05/01/2016   Intercostal pain 05/01/2016   Acute constipation 11/18/2015   Paroxysmal supraventricular tachycardia ([place]) 11/04/2015   Bursitis of hip 06/14/2015   Impingement syndrome of shoulder region 06/14/2015   Hyperlipidemia, mixed 12/23/2013   CAD in native artery 12/01/2013   Hypophonia 12/10/2012   Sialorrhea 12/10/2012    Diverticulosis 09/26/2006    Past Surgical History:  Procedure Laterality Date   ABDOMINAL HYSTERECTOMY     CHOLECYSTECTOMY     DEEP BRAIN STIMULATOR PLACEMENT     FLEXIBLE SIGMOIDOSCOPY N/A 12/19/2017   Procedure: FLEXIBLE SIGMOIDOSCOPY;  Surgeon: Manya Silvas, MD;  [facility];  Service: Endoscopy;  Laterality: N/A;   HEMORROIDECTOMY      Prior to Admission medications   Medication Sig Start Date End Date Taking? Authorizing Provider  Biotin 2.5 MG TABS Take 2.5 mg by mouth daily.   Yes [provider]  carbidopa-levodopa (SINEMET IR) 25-100 MG tablet 1 tablet 4 (four) times daily. 1 tablet 8 am, 11am, 2pm  Half tablet at 5pm and 8pm   Yes [provider]  Carbidopa-Levodopa ER (SINEMET CR) 25-100 MG tablet controlled release Take 1 tablet by mouth 3 (three) times daily. 0800, 1100, 1400 09/23/20  Yes [provider]  clonazePAM (KLONOPIN) 0.5 MG tablet Take 0.25-0.5 mg by mouth 2 (two) times daily as needed for anxiety. Take 0.25 mg (one-half) tablet at 1400 and 0.5 mg (one tablet) at bedtime   Yes [provider]  Docusate Sodium (DSS) 100 MG CAPS Take 1 capsule by mouth daily.   Yes [provider]  lactobacillus acidophilus (BACID) TABS tablet Take 2 tablets by mouth 3 (three) times daily.   Yes [provider]  Mesalamine (ASACOL) 400 MG CPDR DR capsule 2 (two) times daily.   Yes [provider]  mirabegron ER (MYRBETRIQ) 25 MG TB24 tablet Take 25 mg by mouth daily.   Yes  [provider]  omeprazole (PRILOSEC) 10 MG capsule Take 10 mg by mouth daily.   Yes [provider]  rOPINIRole (REQUIP) 1 MG tablet Take 1 mg by mouth at bedtime. 06/19/16  Yes [provider]  sertraline (ZOLOFT) 25 MG tablet Take 75 mg by mouth daily.   Yes [provider]  traZODone (DESYREL) 50 MG tablet Take 100 mg by mouth at bedtime.   Yes [provider]  acetaminophen (TYLENOL) 650  MG CR tablet Take 650 mg by mouth every 8 (eight) hours as needed for pain.    [provider]  clonazePAM (KLONOPIN) 0.5 MG tablet Take 0.25 mg by mouth daily. In the morning. Patient not taking: No sig reported    [provider]  Vitamin D, Ergocalciferol, (DRISDOL) 50000 units CAPS capsule Take 50,000 Units by mouth every 7 (seven) days.    [provider]  cetirizine (ZYRTEC) 10 MG tablet Take by mouth.  12/29/19  [provider]  famotidine (PEPCID) 20 MG tablet Take 20 mg by mouth daily as needed for heartburn or indigestion.   12/29/19  [provider]  ranitidine (ZANTAC) 150 MG tablet ranitidine hcl 150 mg tabs  12/29/19  [provider]     Allergies Cefuroxime axetil, Chlorpromazine, Penicillins, Sucralfate, Levaquin [levofloxacin in d5w], Septra [sulfamethoxazole-trimethoprim], Cefuroxime, and Levaquin [levofloxacin]  Family History  Problem Relation Age of Onset   Breast cancer Maternal Aunt    Breast cancer Paternal Grandmother     Social History Social History   Tobacco Use   Smoking status: Never   Smokeless tobacco: Never  Vaping Use   Vaping Use: Never used  Substance Use Topics   Alcohol use: Yes    Comment: occasional   Drug use: No    Unable to obtain accurate review of Systems     ____________________________________________   PHYSICAL EXAM:  VITAL SIGNS: ED Triage Vitals  Enc Vitals Group     BP 11/26/20 1302 (!) 147/72     Pulse Rate 11/26/20 1302 90     Resp 11/26/20 1302 16     Temp 11/26/20 1302 98.3 F (36.8 C)     Temp Source 11/26/20 1302 Oral     SpO2 11/26/20 1302 96 %     Weight 11/26/20 1303 47.6 kg (104 lb 15 oz)     Height 11/26/20 1303 1.626 m (5' 4" )     Head Circumference --      Peak Flow --      Pain Score --      Pain Loc --      Pain Edu? --      Excl. in [place]? --     Constitutional: Alert  Eyes: Conjunctivae are normal.  Head: No bleeding or palpable  hematoma to the scalp, no bony abnormality Nose: No congestion/rhinnorhea. Mouth/Throat: Mucous membranes are moist.   Neck:  Painless ROM, no pain with axial load, no vertebral terms palpation Cardiovascular: Normal rate, regular rhythm. Grossly normal heart sounds.  Good peripheral circulation.  No chest wall tenderness palpation Respiratory: Normal respiratory effort.  No retractions. Lungs CTAB. Gastrointestinal: Soft and nontender. No distention.  No CVA tenderness. Genitourinary: deferred Musculoskeletal: Patient is able to flex and extend at the hip bilaterally, no pain with axial load on both hips Neurologic:  Normal speech and language. No gross focal neurologic deficits are appreciated.  Skin:  Skin is warm, dry and intact.    ____________________________________________   LABS (all labs ordered  are listed, but only abnormal results are displayed)  Labs Reviewed  BASIC METABOLIC PANEL - Abnormal; Notable for the following components:      Result Value   Glucose, Bld 102 (*)    Calcium 8.8 (*)    All other components within normal limits  CBC WITH DIFFERENTIAL/PLATELET   ____________________________________________  EKG   ____________________________________________  RADIOLOGY  Hip x-ray reviewed by me, no fracture ____________________________________________   PROCEDURES  Procedure(s) performed: No  Procedures   Critical Care performed: No ____________________________________________   INITIAL IMPRESSION / ASSESSMENT AND PLAN / ED COURSE  Pertinent labs & imaging results that were available during my care of the patient were reviewed by me and considered in my medical decision making (see chart for details).   Patient with history dementia presents after a witnessed fall, reported head injury, does not appear to be on blood thinners  Exam is overall reassuring to me, will x-ray pelvis/right hip, CT head  Imaging is reassuring, lab work is  unremarkable, patient is at her baseline  Discussed findings with patient's daughter he was here, proper for discharge at this time    ____________________________________________   FINAL CLINICAL IMPRESSION(S) / ED DIAGNOSES  Final diagnoses:  Fall, initial encounter  Injury of head, initial encounter        Note:  This document was prepared using Dragon voice recognition software and may include unintentional dictation errors.    Lavonia Drafts, MD 11/26/20 [phone]

## 2020-11-26 NOTE — ED Triage Notes (Signed)
BIB [facility]. Pt was getting out of her wheel chair. Pt struck her head. Vitals WNL.  18G left AC by EMS with 61mg of fentanyl.  Right inward rotation to right hip per EMS and bound in sheet. Pt unable to answer questions at this moment due to medication.

## 2020-12-17 ENCOUNTER — Non-Acute Institutional Stay: Payer: Medicare PPO | Admitting: Student

## 2020-12-17 ENCOUNTER — Other Ambulatory Visit: Payer: Self-pay

## 2020-12-17 DIAGNOSIS — F419 Anxiety disorder, unspecified: Secondary | ICD-10-CM

## 2020-12-17 DIAGNOSIS — R531 Weakness: Secondary | ICD-10-CM

## 2020-12-17 DIAGNOSIS — G20 Parkinson's disease: Secondary | ICD-10-CM

## 2020-12-17 DIAGNOSIS — Z515 Encounter for palliative care: Secondary | ICD-10-CM

## 2020-12-17 DIAGNOSIS — F339 Major depressive disorder, recurrent, unspecified: Secondary | ICD-10-CM

## 2020-12-17 NOTE — Progress Notes (Signed)
[facility] Consult Note Telephone: [phone]  Fax: [phone]    Date of encounter: 12/17/20 10:50 AM PATIENT NAME: Kelsey Owens [place] [address]   [phone] (home)  DOB: [birth year] MRN: [id number] PRIMARY CARE PROVIDER:    Albina Billet, Owens,  [address]   Kelsey Owens [address] [phone]  REFERRING PROVIDER:   Albina Billet, Owens [address]   [place],  [place] [address] [phone]  RESPONSIBLE PARTY:    Contact Information     Name Relation Home Work Mobile   Kelsey Owens Daughter   [phone]   Kelsey Owens   [phone]        I met face to face with patient and family in the facility. Palliative Care was asked to follow this patient by consultation request of  Kelsey Billet, Owens to address advance care planning and complex medical decision making. This is a follow up visit.                                   ASSESSMENT AND PLAN / RECOMMENDATIONS:   Advance Care Planning/Goals of Care: Goals include to maximize quality of life and symptom management.   CODE STATUS: DNR  Symptom Management/Plan:  Parkinson's Disease-Continue sinemet, sinemet ER as directed. Patient recently transitioned to ALF Staff to assist with adl's. Monitor for falls/safety. Appetite is good; monitor intake.   Depression, anxiety-patient's sertraline had been increased to 75 mg prior to moving; facility has been administering 50 mg daily. Will send script to [facility] with updated orders. Will monitor for effectiveness. Suggested to family to bring in activities she may enjoy. Discussed getting out of room for socialization.   Generalized weakness-recommend PT as directed. Use w/c for locomotion.    Follow up Palliative Care Visit: Palliative care will continue to follow for complex medical decision making, advance care planning, and clarification of goals. Return in 6-8 weeks or  prn.  I spent 60 minutes providing this consultation. More than 50% of the time in this consultation was spent in counseling and care coordination.   PPS: 40%  HOSPICE ELIGIBILITY/DIAGNOSIS: TBD  Chief Complaint: Palliative Medicine follow up visit.   HISTORY OF PRESENT ILLNESS:  Kelsey Owens is a 80 y.o. year old female  with Parkinson's disease, DJD, CAD, PAD, GERD, ulcerative colitis.    Patient resides at [facility]. She is joined by her daughters Kelsey Owens and Kelsey Owens today. Patient will be transitioning to a provider that comes in facility with [facility]. Staff is working on getting her medications reconciled as there was some discrepancy between [facility] and what she was taking at home. Her medications will be sent to [facility] to be pill packed. Patient denies pain, shortness of breath, constipation. She does endorse getting weaker. She is to be receiving PT and ST services per daughters. She is sleeping well. Family does report patient is not as sociable as she once was. She does stay in her room a lot. Family is unsure if she is getting the Sertraline at 75 mg. Patient seen in ED on 11/26/20 due to a fall and c/o hip pain. A 10-point review of systems is negative, except for the pertinent positives and negatives detailed in the HPI.     History obtained from review of EMR, discussion with primary team, and interview with family,  facility staff/caregiver and/or Kelsey Owens.  I reviewed available labs, medications, imaging, studies and related documents from the EMR.  Records reviewed and summarized above.    Physical Exam:  Pulse 84, resp 16, b/p 104/58, sats 97% on room air Constitutional: NAD General: frail appearing, thin EYES: anicteric sclera, lids intact, no discharge  ENMT: intact hearing, oral mucous membranes moist, dentition intact CV: S1S2, RRR, no LE edema Pulmonary: LCTA, no increased work of breathing, no cough, room air Abdomen: normo-active BS + 4  quadrants, soft and non tender GU: deferred MSK: moves all extremities, non-ambulatory Skin: warm and dry, burst blister to left forearm, no erythema, bleeding. Area LOTA Neuro: generalized weakness, slower to respond Psych: non-anxious affect, A and O x 3 Hem/lymph/immuno: no widespread bruising   Thank you for the opportunity to participate in the care of Kelsey Owens.  The palliative care team will continue to follow. Please call our office at [phone] if we can be of additional assistance.   Kelsey Slocumb, NP   COVID-19 PATIENT SCREENING TOOL Asked and negative response unless otherwise noted:   Have you had symptoms of covid, tested positive or been in contact with someone with symptoms/positive test in the past 5-10 days? No

## 2021-02-13 DEATH — deceased

## 2021-02-18 ENCOUNTER — Telehealth: Payer: Self-pay | Admitting: Primary Care

## 2021-02-18 NOTE — Telephone Encounter (Signed)
Patient expired on January 31, 2021
